# Patient Record
Sex: Female | Born: 1955 | Race: White | Hispanic: No | Marital: Married | Smoking: Never smoker
Health system: Southern US, Community
[De-identification: ages and names within clinical notes are randomized; demographics above are authoritative.]

## PROBLEM LIST (undated history)

## (undated) HISTORY — PX: OOPHORECTOMY: SHX86

## (undated) HISTORY — PX: ABDOMINAL HYSTERECTOMY: SHX81

---

## 2004-04-09 ENCOUNTER — Other Ambulatory Visit: Payer: Self-pay

## 2008-05-09 ENCOUNTER — Ambulatory Visit: Payer: Self-pay | Admitting: Family Medicine

## 2010-06-08 ENCOUNTER — Ambulatory Visit: Payer: Self-pay

## 2014-10-24 ENCOUNTER — Ambulatory Visit: Payer: Self-pay | Admitting: Family Medicine

## 2017-10-05 ENCOUNTER — Other Ambulatory Visit: Payer: Self-pay | Admitting: Family Medicine

## 2017-10-05 DIAGNOSIS — Z1231 Encounter for screening mammogram for malignant neoplasm of breast: Secondary | ICD-10-CM

## 2017-10-25 ENCOUNTER — Encounter: Payer: Self-pay | Admitting: Radiology

## 2017-10-25 ENCOUNTER — Ambulatory Visit
Admission: RE | Admit: 2017-10-25 | Discharge: 2017-10-25 | Disposition: A | Discharge status: Home / Self Care | Payer: Managed Care, Other (non HMO) | Source: Ambulatory Visit | Attending: Family Medicine | Admitting: Family Medicine

## 2017-10-25 DIAGNOSIS — Z1231 Encounter for screening mammogram for malignant neoplasm of breast: Secondary | ICD-10-CM

## 2019-08-06 ENCOUNTER — Other Ambulatory Visit: Payer: Self-pay

## 2019-08-06 ENCOUNTER — Emergency Department: Payer: Self-pay

## 2019-08-06 ENCOUNTER — Observation Stay
Admission: EM | Admit: 2019-08-06 | Discharge: 2019-08-07 | Disposition: A | Discharge status: Home / Self Care | Payer: Self-pay | Attending: Internal Medicine | Admitting: Internal Medicine

## 2019-08-06 ENCOUNTER — Observation Stay: Payer: Self-pay

## 2019-08-06 DIAGNOSIS — R079 Chest pain, unspecified: Secondary | ICD-10-CM

## 2019-08-06 DIAGNOSIS — R55 Syncope and collapse: Principal | ICD-10-CM | POA: Diagnosis present

## 2019-08-06 DIAGNOSIS — Z20828 Contact with and (suspected) exposure to other viral communicable diseases: Secondary | ICD-10-CM | POA: Insufficient documentation

## 2019-08-06 DIAGNOSIS — M79601 Pain in right arm: Secondary | ICD-10-CM | POA: Insufficient documentation

## 2019-08-06 DIAGNOSIS — R609 Edema, unspecified: Secondary | ICD-10-CM

## 2019-08-06 DIAGNOSIS — Z90710 Acquired absence of both cervix and uterus: Secondary | ICD-10-CM | POA: Insufficient documentation

## 2019-08-06 DIAGNOSIS — E669 Obesity, unspecified: Secondary | ICD-10-CM | POA: Insufficient documentation

## 2019-08-06 DIAGNOSIS — Z6837 Body mass index (BMI) 37.0-37.9, adult: Secondary | ICD-10-CM | POA: Insufficient documentation

## 2019-08-06 DIAGNOSIS — R0789 Other chest pain: Secondary | ICD-10-CM | POA: Insufficient documentation

## 2019-08-06 LAB — CBC
HCT: 38.1 % (ref 36.0–46.0)
Hemoglobin: 12.9 g/dL (ref 12.0–15.0)
MCH: 30.1 pg (ref 26.0–34.0)
MCHC: 33.9 g/dL (ref 30.0–36.0)
MCV: 88.8 fL (ref 80.0–100.0)
Platelets: 143 10*3/uL — ABNORMAL LOW (ref 150–400)
RBC: 4.29 MIL/uL (ref 3.87–5.11)
RDW: 12.3 % (ref 11.5–15.5)
WBC: 4.9 10*3/uL (ref 4.0–10.5)
nRBC: 0 % (ref 0.0–0.2)

## 2019-08-06 LAB — BASIC METABOLIC PANEL
Anion gap: 9 (ref 5–15)
BUN: 15 mg/dL (ref 8–23)
CO2: 23 mmol/L (ref 22–32)
Calcium: 8.7 mg/dL — ABNORMAL LOW (ref 8.9–10.3)
Chloride: 109 mmol/L (ref 98–111)
Creatinine, Ser: 0.58 mg/dL (ref 0.44–1.00)
GFR calc Af Amer: >60 mL/min (ref >60)
GFR calc non Af Amer: >60 mL/min (ref >60)
Glucose, Bld: 104 mg/dL — ABNORMAL HIGH (ref 70–99)
Potassium: 4.2 mmol/L (ref 3.5–5.1)
Sodium: 141 mmol/L (ref 135–145)

## 2019-08-06 LAB — CBC WITH DIFFERENTIAL/PLATELET
Abs Immature Granulocytes: 0.02 10*3/uL (ref 0.00–0.07)
Basophils Absolute: 0 10*3/uL (ref 0.0–0.1)
Basophils Relative: 0 %
Eosinophils Absolute: 0.2 10*3/uL (ref 0.0–0.5)
Eosinophils Relative: 4 %
HCT: 39.5 % (ref 36.0–46.0)
Hemoglobin: 12.9 g/dL (ref 12.0–15.0)
Immature Granulocytes: 0 %
Lymphocytes Relative: 32 %
Lymphs Abs: 1.5 10*3/uL (ref 0.7–4.0)
MCH: 30 pg (ref 26.0–34.0)
MCHC: 32.7 g/dL (ref 30.0–36.0)
MCV: 91.9 fL (ref 80.0–100.0)
Monocytes Absolute: 0.4 10*3/uL (ref 0.1–1.0)
Monocytes Relative: 8 %
Neutro Abs: 2.7 10*3/uL (ref 1.7–7.7)
Neutrophils Relative %: 56 %
Platelets: 145 10*3/uL — ABNORMAL LOW (ref 150–400)
RBC: 4.3 MIL/uL (ref 3.87–5.11)
RDW: 12.4 % (ref 11.5–15.5)
WBC: 4.8 10*3/uL (ref 4.0–10.5)
nRBC: 0 % (ref 0.0–0.2)

## 2019-08-06 LAB — TROPONIN I (HIGH SENSITIVITY)
Troponin I (High Sensitivity): 2 ng/L (ref <18)
Troponin I (High Sensitivity): 2 ng/L (ref <18)

## 2019-08-06 LAB — CREATININE, SERUM
Creatinine, Ser: 0.61 mg/dL (ref 0.44–1.00)
GFR calc Af Amer: >60 mL/min (ref >60)
GFR calc non Af Amer: >60 mL/min (ref >60)

## 2019-08-06 LAB — FIBRIN DERIVATIVES D-DIMER (ARMC ONLY): Fibrin derivatives D-dimer (ARMC): 301.43 ng/mL (FEU) (ref 0.00–499.00)

## 2019-08-06 MED ORDER — ALPRAZOLAM 1 MG PO TABS: 1.0000 mg | ORAL_TABLET | Freq: Every day | ORAL | Status: DC | PRN

## 2019-08-06 MED ORDER — SODIUM CHLORIDE 0.9 % IV SOLN
INTRAVENOUS | Status: AC
  Administered 2019-08-06 – 2019-08-07 (×2): via INTRAVENOUS

## 2019-08-06 MED ORDER — ONDANSETRON HCL 4 MG/2ML IJ SOLN: 4.0000 mg | Freq: Four times a day (QID) | INTRAMUSCULAR | Status: DC | PRN

## 2019-08-06 MED ORDER — IOHEXOL 350 MG/ML SOLN
75.0000 mL | Freq: Once | INTRAVENOUS | Status: AC | PRN
  Administered 2019-08-06: 75 mL via INTRAVENOUS

## 2019-08-06 MED ORDER — ACETAMINOPHEN 650 MG RE SUPP: 650.0000 mg | Freq: Four times a day (QID) | RECTAL | Status: DC | PRN

## 2019-08-06 MED ORDER — ONDANSETRON HCL 4 MG PO TABS: 4.0000 mg | ORAL_TABLET | Freq: Four times a day (QID) | ORAL | Status: DC | PRN

## 2019-08-06 MED ORDER — ACETAMINOPHEN 325 MG PO TABS
650.0000 mg | ORAL_TABLET | Freq: Four times a day (QID) | ORAL | Status: DC | PRN
  Administered 2019-08-07: 650 mg via ORAL
  Filled 2019-08-06: qty 2

## 2019-08-06 MED ORDER — VITAMIN C 500 MG PO TABS
500.0000 mg | ORAL_TABLET | Freq: Every day | ORAL | Status: DC
  Administered 2019-08-06: 500 mg via ORAL

## 2019-08-06 MED ORDER — ENOXAPARIN SODIUM 40 MG/0.4ML ~~LOC~~ SOLN
40.0000 mg | SUBCUTANEOUS | Status: DC
  Administered 2019-08-06: 40 mg via SUBCUTANEOUS
  Filled 2019-08-06: qty 0.4

## 2019-08-06 MED ORDER — DOCUSATE SODIUM 100 MG PO CAPS: 100.0000 mg | ORAL_CAPSULE | Freq: Two times a day (BID) | ORAL | Status: DC | PRN

## 2019-08-06 NOTE — ED Provider Notes (Signed)
[facility] Emergency Department Provider Note   ____________________________________________   First MD Initiated Contact with Patient 08/06/19 1048     (approximate)  I have reviewed the triage vital signs and the nursing notes.   HISTORY  Chief Complaint Loss of Consciousness and Chest Pain    HPI Hannah Cross is a 63 y.o. female with no significant past medical history who presents to the ED following syncopal episode.  Patient reports she remembers sitting at her desk at work when she suddenly had pain in her chest.  She remembers attempting to stand up and then woke up to having EMS around her.  She describes the chest pain as a pressure that seems to have resolved when she woke up.  She does not remember having any associated shortness of breath, denies any pain or swelling in her lower extremities.  She denies any history of DVT/PE, has not had any recent surgery or chemotherapy.  She states she passed out once many years ago, but states that episode was dissimilar from today.  She states she was feeling well when she went to work this morning with no recent fevers, chills, or cough.        History reviewed. No pertinent past medical history.  There are no active problems to display for this patient.   Past Surgical History:  Procedure Laterality Date   ABDOMINAL HYSTERECTOMY     OOPHORECTOMY      Prior to Admission medications   Medication Sig Start Date End Date Taking? Authorizing Provider  ALPRAZolam Prudy Feeler(XANAX) 1 MG tablet Take 1 mg by mouth daily as needed (headache).   Yes [provider]  Methylsulfonylmethane (MSM) 1000 MG TABS Take 1,000 mg by mouth daily.   Yes [provider]  vitamin C (ASCORBIC ACID) 500 MG tablet Take 500 mg by mouth daily.   Yes [provider]    Allergies Patient has no known allergies.  Family History  Problem Relation Age of Onset   Breast cancer Neg Hx     Social  History Social History   Tobacco Use   Smoking status: Never Smoker  Substance Use Topics   Alcohol use: Never    Frequency: Never   Drug use: Not on file    Review of Systems  Constitutional: No fever/chills Eyes: No visual changes. ENT: No sore throat. Cardiovascular: Positive for chest pain and syncope. Respiratory: Denies shortness of breath. Gastrointestinal: No abdominal pain.  No nausea, no vomiting.  No diarrhea.  No constipation. Genitourinary: Negative for dysuria. Musculoskeletal: Negative for back pain. Skin: Negative for rash. Neurological: Negative for headaches, focal weakness or numbness.  ____________________________________________   PHYSICAL EXAM:  VITAL SIGNS: ED Triage Vitals [08/06/19 1041]  Enc Vitals Group     BP (!) 141/77     Pulse Rate 68     Resp 12     Temp 98.2 F (36.8 C)     Temp Source Oral     SpO2 100 %     Weight 218 lb (98.9 kg)     Height 5\' 4"  (1.626 m)     Head Circumference      Peak Flow      Pain Score 0     Pain Loc      Pain Edu?      Excl. in GC?     Constitutional: Alert and oriented. Eyes: Conjunctivae are normal. Head: Atraumatic. Nose: No congestion/rhinnorhea. Mouth/Throat: Mucous membranes are moist. Neck:  Normal ROM Cardiovascular: Normal rate, regular rhythm. Grossly normal heart sounds. Respiratory: Normal respiratory effort.  No retractions. Lungs CTAB. Gastrointestinal: Soft and nontender. No distention. Genitourinary: deferred Musculoskeletal: No lower extremity tenderness nor edema. Neurologic:  Normal speech and language. No gross focal neurologic deficits are appreciated. Skin:  Skin is warm, dry and intact. No rash noted. Psychiatric: Mood and affect are normal. Speech and behavior are normal.  ____________________________________________   LABS (all labs ordered are listed, but only abnormal results are displayed)  Labs Reviewed  BASIC METABOLIC PANEL - Abnormal; Notable for the  following components:      Result Value   Glucose, Bld 104 (*)    Calcium 8.7 (*)    All other components within normal limits  CBC WITH DIFFERENTIAL/PLATELET - Abnormal; Notable for the following components:   Platelets 145 (*)    All other components within normal limits  SARS CORONAVIRUS 2  FIBRIN DERIVATIVES D-DIMER ([facility] ONLY)  TROPONIN I (HIGH SENSITIVITY)  TROPONIN I (HIGH SENSITIVITY)   ____________________________________________  EKG  ED ECG REPORT I, Blake Divine, the attending physician, personally viewed and interpreted this ECG.   Date: 08/06/2019  EKG Time: 10:37  Rate: 71  Rhythm: normal sinus rhythm  Axis: Normal  Intervals:none  ST&T Change: None    PROCEDURES  Procedure(s) performed (including Critical Care):  Procedures   ____________________________________________   INITIAL IMPRESSION / ASSESSMENT AND PLAN / ED COURSE       62 year old female with no significant past medical history presents following syncopal episode at work where she had pain in her chest and then suddenly passed out.  While she has no apparent risk factors for PE, chest pain and syncope is concerning, will check d-dimer.  Initial EKG without acute ischemic changes and troponin negative, remainder of labs unremarkable.  Chest x-ray negative for acute process.  D-dimer within normal limits, do not suspect PE given patient is low risk by [place].  Will admit for further syncope work-up given patient with chest pain associated with her syncope.  She remains asymptomatic at this time.      ____________________________________________   FINAL CLINICAL IMPRESSION(S) / ED DIAGNOSES  Final diagnoses:  Syncope and collapse  Chest pain, unspecified type     ED Discharge Orders    None       Note:  This document was prepared using Dragon voice recognition software and may include unintentional dictation errors.   Blake Divine, MD 08/06/19 1414

## 2019-08-06 NOTE — ED Notes (Signed)
Pt transported to CT ?

## 2019-08-06 NOTE — H&P (Addendum)
[facility] NAME: Hannah Cross    MR#:  [id number]  DATE OF BIRTH:  [birth year]  DATE OF ADMISSION:  08/06/2019  PRIMARY CARE PHYSICIAN: Ricardo Jericho, NP   REQUESTING/REFERRING PHYSICIAN: Blake Divine, MD  CHIEF COMPLAINT:   Loss of consciousness and chest pain HISTORY OF PRESENT ILLNESS:  Hannah Cross  is a 63 y.o. female with no past medical history came into the ED after she sustained a syncopal episode.  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  Patient is not quite sure how long she was sitting on the floor.  No similar episodes in the past and patient is pretty healthy denies any complaints.  Denies any recent travel no sick contacts.  Denies any fever or cough no other complaints troponins x2 are negative.  D-dimer sent in the normal range and EKG with no acute changes  PAST MEDICAL HISTORY:  History reviewed. No pertinent past medical history.  PAST SURGICAL HISTOIRY:   Past Surgical History:  Procedure Laterality Date  . ABDOMINAL HYSTERECTOMY    . OOPHORECTOMY      SOCIAL HISTORY:   Social History   Tobacco Use  . Smoking status: Never Smoker  Substance Use Topics  . Alcohol use: Never    Frequency: Never    FAMILY HISTORY:   Family History  Problem Relation Age of Onset  . Breast cancer Neg Hx     DRUG ALLERGIES:  No Known Allergies  REVIEW OF SYSTEMS:  CONSTITUTIONAL: No fever, fatigue or weakness.  EYES: No blurred or double vision.  EARS, NOSE, AND THROAT: No tinnitus or ear pain.  RESPIRATORY: No cough, shortness of breath, wheezing or hemoptysis.  CARDIOVASCULAR: No chest pain, orthopnea, edema.  GASTROINTESTINAL: No nausea, vomiting, diarrhea or abdominal pain.   GENITOURINARY: No dysuria, hematuria.  ENDOCRINE: No polyuria, nocturia,  HEMATOLOGY: No anemia, easy bruising or bleeding SKIN: No rash or lesion. MUSCULOSKELETAL: No joint pain or arthritis.   NEUROLOGIC: No tingling, numbness, weakness.  PSYCHIATRY: No anxiety or depression.   MEDICATIONS AT HOME:   Prior to Admission medications   Medication Sig Start Date End Date Taking? Authorizing Provider  ALPRAZolam Duanne Moron) 1 MG tablet Take 1 mg by mouth daily as needed (headache).   Yes [provider]  Methylsulfonylmethane (MSM) 1000 MG TABS Take 1,000 mg by mouth daily.   Yes [provider]  vitamin C (ASCORBIC ACID) 500 MG tablet Take 500 mg by mouth daily.   Yes [provider]      VITAL SIGNS:  Blood pressure (!) 159/94, pulse 70, temperature 98.2 F (36.8 C), temperature source Oral, resp. rate 12, height 5\' 4"  (1.626 m), weight 98.9 kg, SpO2 99 %.  PHYSICAL EXAMINATION:  GENERAL:  63 y.o.-year-old patient lying in the bed with no acute distress.  EYES: Pupils equal, round, reactive to light and accommodation. No scleral icterus. Extraocular muscles intact.  HEENT: Head atraumatic, normocephalic. Oropharynx and nasopharynx clear.  NECK:  Supple, no jugular venous distention. No thyroid enlargement, no tenderness.  LUNGS: Normal breath sounds bilaterally, no wheezing, rales,rhonchi or crepitation. No use of accessory muscles of respiration.  CARDIOVASCULAR: S1, S2 normal. No murmurs, rubs, or gallops.  ABDOMEN: Soft, nontender, nondistended. Bowel sounds present.  EXTREMITIES:  No pedal edema, cyanosis, or clubbing.  NEUROLOGIC: Cranial nerves II through XII are intact. Muscle strength 5/5 in all extremities. Sensation intact. Gait not checked.  PSYCHIATRIC: The patient is alert and oriented x 3.  SKIN: No obvious rash, lesion, or ulcer.   LABORATORY PANEL:   CBC Recent Labs  Lab 08/06/19 1043  WBC 4.8  HGB 12.9  HCT 39.5  PLT 145*    ------------------------------------------------------------------------------------------------------------------  Chemistries  Recent Labs  Lab 08/06/19 1043  NA 141  K 4.2  CL 109  CO2 23  GLUCOSE 104*  BUN 15  CREATININE 0.58  CALCIUM 8.7*   ------------------------------------------------------------------------------------------------------------------  Cardiac Enzymes No results for input(s): TROPONINI in the last 168 hours. ------------------------------------------------------------------------------------------------------------------  RADIOLOGY:  Dg Chest Portable 1 View  Result Date: 08/06/2019 CLINICAL DATA:  Syncopal episode today. EXAM: PORTABLE CHEST 1 VIEW COMPARISON:  None. FINDINGS: Lungs are clear. Heart size is normal. Aortic atherosclerosis noted. No pneumothorax or pleural fluid. No acute or focal bony abnormality. IMPRESSION: No acute disease. Electronically Signed   By: Drusilla Kannerhomas  Dalessio M.D.   On: 08/06/2019 11:10    EKG:   Orders placed or performed during the hospital encounter of 08/06/19  . EKG 12-Lead  . EKG 12-Lead    IMPRESSION AND PLAN:    #Syncope chest pain Admitted telemetry Cycle cardiac biomarkers.  Troponin -2 times so far Will get echocardiogram CTA head and neck Orthostatics and neuro checks Hydrate with IV fluids Myoview stress test ordered  #Chest pain atypical Patient is totally symptom-free during my examination We will provide nitroglycerin as needed D-dimers are negative Troponin negative to time so far We will get echocardiogram We will consider cardiology consult if chest pain is recurring Myoview stress test  #Anxiety Xanax as needed  #Obesity lifestyle modifications advised   DVT prophylaxis Lovenox subcu    All the records are reviewed and case discussed with ED provider. Management plans discussed with the patient, family and they are in agreement.  CODE STATUS: fc   TOTAL TIME TAKING CARE OF  THIS PATIENT: 42 minutes.   Note: This dictation was prepared with Dragon dictation along with smaller phrase technology. Any transcriptional errors that result from this process are unintentional.  Ramonita LabAruna Edmonia Gonser M.D on 08/06/2019 at 3:45 PM  Between 7am to 6pm - Pager - [phone]  After 6pm go to [email or web address] - password [email or web address] [facility]  [facility]  Office  [phone]  CC: Primary care physician; Titus MouldWhite, Elizabeth Burney, NP

## 2019-08-06 NOTE — ED Notes (Signed)
ED Provider at bedside. 

## 2019-08-06 NOTE — Progress Notes (Signed)
Family Meeting Note  Advance Directive:yes  Today a meeting took place with the Patient.    The following clinical team members were present during this meeting:MD  The following were discussed:Patient's diagnosis: Syncope, lightheadedness, chest pain, obesity, anxiety and treatment plan of care discussed in detail with the patient.  She verbalized understanding of the plan of care   Patient's progosis: Unable to determine and Goals for treatment: Full Code  Husband is the healthcare power of attorney  Additional follow-up to be provided: Hospitalist  Time spent during discussion:17 min  Nicholes Mango, MD

## 2019-08-06 NOTE — ED Notes (Signed)
.. ED TO INPATIENT HANDOFF REPORT  ED Nurse Name and Phone #: Allena Earingnnie [phone]  S Name/Age/Gender Hannah Cross 63 y.o. female Room/Bed: ED03A/ED03A  Code Status   Code Status: Not on file  Home/SNF/Other Home Patient oriented to: self, place, time and situation Is this baseline? Yes   Triage Complete: Triage complete  Chief Complaint syncope  Triage Note To eR via [facility] from work where she had witnessed syncopal episode. Pt nonarousable upon EMS arrival but became alert in ambulance c/o chest pain to palpation and with deep inspiration. administered 324 ASA and 2 nitro spray with EMS. CBG 80 with EMS. 20G saline lock started to left hand.   Pt alert and oriented X4, cooperative, RR even and unlabored, color WNL. Pt in NAD, currenlty   Allergies No Known Allergies  Level of Care/Admitting Diagnosis ED Disposition    ED Disposition Condition Comment   Admit  The patient appears reasonably stabilized for admission considering the current resources, flow, and capabilities available in the ED at this time, and I doubt any other [facility] requiring further screening and/or treatment in the ED prior to admission is  present.       B Medical/Surgery History History reviewed. No pertinent past medical history. Past Surgical History:  Procedure Laterality Date  . ABDOMINAL HYSTERECTOMY    . OOPHORECTOMY       A IV Location/Drains/Wounds Patient Lines/Drains/Airways Status   Active Line/Drains/Airways    None          Intake/Output Last 24 hours No intake or output data in the 24 hours ending 08/06/19 1523  Labs/Imaging Results for orders placed or performed during the hospital encounter of 08/06/19 (from the past 48 hour(s))  Basic metabolic panel     Status: Abnormal   Collection Time: 08/06/19 10:43 AM  Result Value Ref Range   Sodium 141 135 - 145 mmol/L   Potassium 4.2 3.5 - 5.1 mmol/L    Comment: HEMOLYSIS AT THIS LEVEL MAY AFFECT RESULT   Chloride 109 98 -  111 mmol/L   CO2 23 22 - 32 mmol/L   Glucose, Bld 104 (H) 70 - 99 mg/dL   BUN 15 8 - 23 mg/dL   Creatinine, Ser 1.610.58 0.44 - 1.00 mg/dL   Calcium 8.7 (L) 8.9 - 10.3 mg/dL   GFR calc non Af Amer >60 >60 mL/min   GFR calc Af Amer >60 >60 mL/min   Anion gap 9 5 - 15    Comment: Performed at [facility], [address] [place], [place] [address]  CBC with Differential     Status: Abnormal   Collection Time: 08/06/19 10:43 AM  Result Value Ref Range   WBC 4.8 4.0 - 10.5 K/uL   RBC 4.30 3.87 - 5.11 MIL/uL   Hemoglobin 12.9 12.0 - 15.0 g/dL   HCT 54.039.5 98.136.0 - 19.146.0 %   MCV 91.9 80.0 - 100.0 fL   MCH 30.0 26.0 - 34.0 pg   MCHC 32.7 30.0 - 36.0 g/dL   RDW 47.812.4 29.511.5 - 62.115.5 %   Platelets 145 (L) 150 - 400 K/uL   nRBC 0.0 0.0 - 0.2 %   Neutrophils Relative % 56 %   Neutro Abs 2.7 1.7 - 7.7 K/uL   Lymphocytes Relative 32 %   Lymphs Abs 1.5 0.7 - 4.0 K/uL   Monocytes Relative 8 %   Monocytes Absolute 0.4 0.1 - 1.0 K/uL   Eosinophils Relative 4 %   Eosinophils Absolute 0.2 0.0 -  0.5 K/uL   Basophils Relative 0 %   Basophils Absolute 0.0 0.0 - 0.1 K/uL   Immature Granulocytes 0 %   Abs Immature Granulocytes 0.02 0.00 - 0.07 K/uL    Comment: Performed at [facility], [address] [place], [place] [address]  Troponin I (High Sensitivity)     Status: None   Collection Time: 08/06/19 10:43 AM  Result Value Ref Range   Troponin I (High Sensitivity) 2 <18 ng/L    Comment: (NOTE) Elevated high sensitivity troponin I (hsTnI) values and significant  changes across serial measurements may suggest ACS but many other  chronic and acute conditions are known to elevate hsTnI results.  Refer to the "Links" section for chest pain algorithms and additional  guidance. Performed at [facility], [address] [place], [place] [address]   Troponin I (High Sensitivity)     Status: None   Collection Time: 08/06/19 12:47 PM  Result Value Ref Range   Troponin I (High  Sensitivity) 2 <18 ng/L    Comment: (NOTE) Elevated high sensitivity troponin I (hsTnI) values and significant  changes across serial measurements may suggest ACS but many other  chronic and acute conditions are known to elevate hsTnI results.  Refer to the "Links" section for chest pain algorithms and additional  guidance. Performed at [facility], [address] [place], [place] [address]   Fibrin derivatives D-Dimer     Status: None   Collection Time: 08/06/19 12:51 PM  Result Value Ref Range   Fibrin derivatives D-dimer ([facility]) 301.43 0.00 - 499.00 ng/mL (FEU)    Comment: (NOTE) <> Exclusion of Venous Thromboembolism (VTE) - OUTPATIENT ONLY   (Emergency Department or [place])   0-499 ng/ml (FEU): With a low to intermediate pretest probability                      for VTE this test result excludes the diagnosis                      of VTE.   >499 ng/ml (FEU) : VTE not excluded; additional work up for VTE is                      required. <> Testing on Inpatients and Evaluation of Disseminated Intravascular   Coagulation (DIC) Reference Range:   0-499 ng/ml (FEU) Performed at [facility], [address] [place], [place] [address]    Dg Chest Portable 1 View  Result Date: 08/06/2019 CLINICAL DATA:  Syncopal episode today. EXAM: PORTABLE CHEST 1 VIEW COMPARISON:  None. FINDINGS: Lungs are clear. Heart size is normal. Aortic atherosclerosis noted. No pneumothorax or pleural fluid. No acute or focal bony abnormality. IMPRESSION: No acute disease. Electronically Signed   By: Drusilla Kannerhomas  Dalessio M.D.   On: 08/06/2019 11:10    Pending Labs Unresulted Labs (From admission, onward)    Start     Ordered   08/06/19 1414  SARS CORONAVIRUS 2 Nasal Swab Aptima Multi Swab  (Asymptomatic/Tier 2 Patients Labs)  Once,   STAT    Question Answer Comment  Is this test for diagnosis or screening Screening   Symptomatic for COVID-19 as defined by CDC No   Hospitalized for  COVID-19 No   Admitted to ICU for COVID-19 No   Previously tested for COVID-19 No   Resident in a congregate (group) care setting No   Employed in healthcare setting No  Pregnant No      08/06/19 1413          Vitals/Pain Today's Vitals   08/06/19 1300 08/06/19 1330 08/06/19 1425 08/06/19 1426  BP: 131/76 (!) 142/77  (!) 159/94  Pulse: 65 66  70  Resp: 20 19  12   Temp:      TempSrc:      SpO2: 100% 97%  99%  Weight:      Height:      PainSc:   0-No pain     Isolation Precautions No active isolations  Medications Medications - No data to display  Mobility walks Moderate fall risk    If patient is a Neuro Trauma and patient is going to OR before floor call report to [place] nurse: [phone] or [phone]     R Recommendations: See Admitting Provider Note  Report given to:   Additional Notes:

## 2019-08-06 NOTE — ED Triage Notes (Addendum)
To eR via [facility] from work where she had witnessed syncopal episode. Pt nonarousable upon EMS arrival but became alert in ambulance c/o chest pain to palpation and with deep inspiration. administered 324 ASA and 2 nitro spray with EMS. CBG 80 with EMS. 20G saline lock started to left hand.   Pt alert and oriented X4, cooperative, RR even and unlabored, color WNL. Pt in NAD, currenlty

## 2019-08-07 ENCOUNTER — Observation Stay: Payer: Self-pay

## 2019-08-07 ENCOUNTER — Encounter: Payer: Self-pay | Admitting: Radiology

## 2019-08-07 ENCOUNTER — Observation Stay (HOSPITAL_BASED_OUTPATIENT_CLINIC_OR_DEPARTMENT_OTHER)
Admit: 2019-08-07 | Discharge: 2019-08-07 | Disposition: A | Discharge status: Home / Self Care | Payer: Self-pay | Attending: Internal Medicine | Admitting: Internal Medicine

## 2019-08-07 ENCOUNTER — Observation Stay (HOSPITAL_BASED_OUTPATIENT_CLINIC_OR_DEPARTMENT_OTHER): Payer: Self-pay

## 2019-08-07 ENCOUNTER — Other Ambulatory Visit: Payer: Self-pay | Admitting: Radiology

## 2019-08-07 DIAGNOSIS — R55 Syncope and collapse: Secondary | ICD-10-CM

## 2019-08-07 DIAGNOSIS — R079 Chest pain, unspecified: Secondary | ICD-10-CM

## 2019-08-07 LAB — CBC
HCT: 37 % (ref 36.0–46.0)
Hemoglobin: 12.3 g/dL (ref 12.0–15.0)
MCH: 29.6 pg (ref 26.0–34.0)
MCHC: 33.2 g/dL (ref 30.0–36.0)
MCV: 89.2 fL (ref 80.0–100.0)
Platelets: 130 10*3/uL — ABNORMAL LOW (ref 150–400)
RBC: 4.15 MIL/uL (ref 3.87–5.11)
RDW: 12.3 % (ref 11.5–15.5)
WBC: 4.2 10*3/uL (ref 4.0–10.5)
nRBC: 0 % (ref 0.0–0.2)

## 2019-08-07 LAB — LIPID PANEL
Cholesterol: 183 mg/dL (ref 0–200)
HDL: 52 mg/dL (ref >40)
LDL Cholesterol: 109 mg/dL — ABNORMAL HIGH (ref 0–99)
Total CHOL/HDL Ratio: 3.5 RATIO
Triglycerides: 111 mg/dL (ref <150)
VLDL: 22 mg/dL (ref 0–40)

## 2019-08-07 LAB — ECHOCARDIOGRAM COMPLETE
Height: 64 in
Weight: 3488 oz

## 2019-08-07 LAB — NM MYOCAR MULTI W/SPECT W/WALL MOTION / EF
Estimated workload: 1 METS
LV dias vol: 75 mL (ref 46–106)
LV sys vol: 24 mL
MPHR: 158 {beats}/min
Peak HR: 118 {beats}/min
Percent HR: 74 %
Rest HR: 75 {beats}/min
TID: 0.98

## 2019-08-07 LAB — COMPREHENSIVE METABOLIC PANEL
ALT: 22 U/L (ref 0–44)
AST: 21 U/L (ref 15–41)
Albumin: 3.5 g/dL (ref 3.5–5.0)
Alkaline Phosphatase: 96 U/L (ref 38–126)
Anion gap: 7 (ref 5–15)
BUN: 16 mg/dL (ref 8–23)
CO2: 25 mmol/L (ref 22–32)
Calcium: 8.6 mg/dL — ABNORMAL LOW (ref 8.9–10.3)
Chloride: 110 mmol/L (ref 98–111)
Creatinine, Ser: 0.59 mg/dL (ref 0.44–1.00)
GFR calc Af Amer: >60 mL/min (ref >60)
GFR calc non Af Amer: >60 mL/min (ref >60)
Glucose, Bld: 102 mg/dL — ABNORMAL HIGH (ref 70–99)
Potassium: 3.7 mmol/L (ref 3.5–5.1)
Sodium: 142 mmol/L (ref 135–145)
Total Bilirubin: 0.9 mg/dL (ref 0.3–1.2)
Total Protein: 6.3 g/dL — ABNORMAL LOW (ref 6.5–8.1)

## 2019-08-07 LAB — TSH: TSH: 2.216 u[IU]/mL (ref 0.350–4.500)

## 2019-08-07 LAB — SARS CORONAVIRUS 2 (TAT 6-24 HRS): SARS Coronavirus 2: NEGATIVE

## 2019-08-07 MED ORDER — TECHNETIUM TC 99M TETROFOSMIN IV KIT
32.7240 | PACK | Freq: Once | INTRAVENOUS | Status: AC | PRN
  Administered 2019-08-07: 32.724 via INTRAVENOUS

## 2019-08-07 MED ORDER — REGADENOSON 0.4 MG/5ML IV SOLN
0.4000 mg | Freq: Once | INTRAVENOUS | Status: AC
  Administered 2019-08-07: 10:00:00 0.4 mg via INTRAVENOUS

## 2019-08-07 MED ORDER — TECHNETIUM TC 99M TETROFOSMIN IV KIT
10.0000 | PACK | Freq: Once | INTRAVENOUS | Status: AC | PRN
  Administered 2019-08-07: 10.966 via INTRAVENOUS

## 2019-08-07 NOTE — Progress Notes (Signed)
*  PRELIMINARY RESULTS* Echocardiogram 2D Echocardiogram has been performed.  [place] 08/07/2019, 10:00 AM

## 2019-08-07 NOTE — Progress Notes (Signed)
[facility]   PATIENT NAME: Tawana ScaleCindy Gall    MR#:  [id number]  DATE OF BIRTH:  02/09/1956  SUBJECTIVE:  CHIEF COMPLAINT:   Chief Complaint  Patient presents with   Loss of Consciousness   Chest Pain   -Patient came in with chest pain and lightheadedness.  Has chronic vertigo -Denies any stress.  Underwent stress test this morning. -Complains of right arm pain proximal to the elbow.  REVIEW OF SYSTEMS:  Review of Systems  Constitutional: Negative for chills, fever and malaise/fatigue.  HENT: Negative for congestion, ear discharge, hearing loss and nosebleeds.   Eyes: Negative for blurred vision and double vision.  Respiratory: Negative for cough, shortness of breath and wheezing.   Cardiovascular: Positive for chest pain. Negative for palpitations and leg swelling.  Gastrointestinal: Negative for abdominal pain, constipation, diarrhea, nausea and vomiting.  Genitourinary: Negative for dysuria.  Musculoskeletal: Positive for myalgias. Negative for joint pain.  Neurological: Negative for dizziness, focal weakness, seizures, weakness and headaches.  Psychiatric/Behavioral: Negative for depression.    DRUG ALLERGIES:  No Known Allergies  VITALS:  Blood pressure 124/76, pulse 65, temperature 97.7 F (36.5 C), temperature source Oral, resp. rate 15, height 5\' 4"  (1.626 m), weight 98.9 kg, SpO2 99 %.  PHYSICAL EXAMINATION:  Physical Exam  GENERAL:  63 y.o.-year-old patient lying in the bed with no acute distress.  EYES: Pupils equal, round, reactive to light and accommodation. No scleral icterus. Extraocular muscles intact.  HEENT: Head atraumatic, normocephalic. Oropharynx and nasopharynx clear.  NECK:  Supple, no jugular venous distention. No thyroid enlargement, no tenderness.  LUNGS: Normal breath sounds bilaterally, no wheezing, rales,rhonchi or crepitation. No use of accessory muscles of respiration.  CARDIOVASCULAR: S1, S2  normal. No murmurs, rubs, or gallops.  ABDOMEN: Soft, nontender, nondistended. Bowel sounds present. No organomegaly or mass.  EXTREMITIES: No pedal edema, cyanosis, or clubbing.  Tenderness of the right arm right about the cubital fossa anteriorly noted.  Minimal swelling noted. NEUROLOGIC: Cranial nerves II through XII are intact. Muscle strength 5/5 in all extremities. Sensation intact. Gait not checked.  PSYCHIATRIC: The patient is alert and oriented x 3.  SKIN: No obvious rash, lesion, or ulcer.    LABORATORY PANEL:   CBC Recent Labs  Lab 08/07/19 0419  WBC 4.2  HGB 12.3  HCT 37.0  PLT 130*   ------------------------------------------------------------------------------------------------------------------  Chemistries  Recent Labs  Lab 08/07/19 0419  NA 142  K 3.7  CL 110  CO2 25  GLUCOSE 102*  BUN 16  CREATININE 0.59  CALCIUM 8.6*  AST 21  ALT 22  ALKPHOS 96  BILITOT 0.9   ------------------------------------------------------------------------------------------------------------------  Cardiac Enzymes No results for input(s): TROPONINI in the last 168 hours. ------------------------------------------------------------------------------------------------------------------  RADIOLOGY:  Ct Angio Head W Or Wo Contrast  Addendum Date: 08/06/2019   ADDENDUM REPORT: 08/06/2019 17:25 ADDENDUM: Noncontrast head CT images have now been provided. Cerebral volume is within normal limits for age. No midline shift, ventriculomegaly, mass effect, evidence of mass lesion, intracranial hemorrhage or evidence of cortically based acute infarction. Gray-white matter differentiation is within normal limits throughout the brain. Visualized paranasal sinuses and mastoids are clear. CONCLUSION: Normal noncontrast CT appearance of the brain. Electronically Signed   By: Odessa FlemingH  Hall M.D.   On: 08/06/2019 17:25   Result Date: 08/06/2019 CLINICAL DATA:  [age] year old female with witnessed  syncopal event. Chest pain and palpitations. EXAM: CT ANGIOGRAPHY HEAD AND NECK TECHNIQUE: Multidetector CT imaging of the head  and neck was performed using the standard protocol during bolus administration of intravenous contrast. Multiplanar CT image reconstructions and MIPs were obtained to evaluate the vascular anatomy. Carotid stenosis measurements (when applicable) are obtained utilizing NASCET criteria, using the distal internal carotid diameter as the denominator. CONTRAST:  35mL OMNIPAQUE IOHEXOL 350 MG/ML SOLN COMPARISON:  None. FINDINGS: CTA NECK Skeleton: No acute osseous abnormality identified. Mild for age spine degeneration. Upper chest: Mild atelectasis, otherwise negative. Other neck: No acute findings. Aortic arch: 3 vessel arch configuration, no arch atherosclerosis. Right carotid system: Negative aside from mild right ICA tortuosity. Left carotid system: Negative. Vertebral arteries: Negative proximal right subclavian artery and right vertebral artery origin. Codominant vertebral arteries, the right is normal to the skull base. Minimal plaque in the proximal left subclavian artery without stenosis. Normal left vertebral artery origin. Tortuous left V1 segment. The left vertebral artery is patent to the skull base without stenosis. CTA HEAD Posterior circulation: Codominant and normal V4 segments. Patent PICA origins and vertebrobasilar junction. Patent basilar artery without stenosis. Normal SCA and PCA origins. Posterior communicating arteries are diminutive or absent. Bilateral PCA branches are within normal limits. Anterior circulation: Both ICA siphons are patent without stenosis. Normal carotid termini, MCA and ACA origins. Anterior communicating artery and bilateral ACA branches are within normal limits. Median artery of the corpus callosum, normal variant. Left MCA M1, bifurcation, and left MCA branches are within normal limits. Right MCA M1, bifurcation, and right MCA branches are within  normal limits. Venous sinuses: Patent. Anatomic variants: Median artery of the corpus callosum. Other findings: No dedicated noncontrast CT of the brain was performed. Visible brain parenchyma appears negative on these CTA images. Review of the MIP images confirms the above findings IMPRESSION: 1. Negative for age CTA Head and Neck; minimal atherosclerosis and no stenosis. 2. No dedicated noncontrast Head CT was provided, grossly negative CT appearance of the brain on these CTA images. Electronically Signed: By: Odessa Fleming M.D. On: 08/06/2019 16:59   Ct Angio Neck W Or Wo Contrast  Addendum Date: 08/06/2019   ADDENDUM REPORT: 08/06/2019 17:25 ADDENDUM: Noncontrast head CT images have now been provided. Cerebral volume is within normal limits for age. No midline shift, ventriculomegaly, mass effect, evidence of mass lesion, intracranial hemorrhage or evidence of cortically based acute infarction. Gray-white matter differentiation is within normal limits throughout the brain. Visualized paranasal sinuses and mastoids are clear. CONCLUSION: Normal noncontrast CT appearance of the brain. Electronically Signed   By: Odessa Fleming M.D.   On: 08/06/2019 17:25   Result Date: 08/06/2019 CLINICAL DATA:  63 year old female with witnessed syncopal event. Chest pain and palpitations. EXAM: CT ANGIOGRAPHY HEAD AND NECK TECHNIQUE: Multidetector CT imaging of the head and neck was performed using the standard protocol during bolus administration of intravenous contrast. Multiplanar CT image reconstructions and MIPs were obtained to evaluate the vascular anatomy. Carotid stenosis measurements (when applicable) are obtained utilizing NASCET criteria, using the distal internal carotid diameter as the denominator. CONTRAST:  42mL OMNIPAQUE IOHEXOL 350 MG/ML SOLN COMPARISON:  None. FINDINGS: CTA NECK Skeleton: No acute osseous abnormality identified. Mild for age spine degeneration. Upper chest: Mild atelectasis, otherwise negative. Other  neck: No acute findings. Aortic arch: 3 vessel arch configuration, no arch atherosclerosis. Right carotid system: Negative aside from mild right ICA tortuosity. Left carotid system: Negative. Vertebral arteries: Negative proximal right subclavian artery and right vertebral artery origin. Codominant vertebral arteries, the right is normal to the skull base. Minimal plaque in the proximal  left subclavian artery without stenosis. Normal left vertebral artery origin. Tortuous left V1 segment. The left vertebral artery is patent to the skull base without stenosis. CTA HEAD Posterior circulation: Codominant and normal V4 segments. Patent PICA origins and vertebrobasilar junction. Patent basilar artery without stenosis. Normal SCA and PCA origins. Posterior communicating arteries are diminutive or absent. Bilateral PCA branches are within normal limits. Anterior circulation: Both ICA siphons are patent without stenosis. Normal carotid termini, MCA and ACA origins. Anterior communicating artery and bilateral ACA branches are within normal limits. Median artery of the corpus callosum, normal variant. Left MCA M1, bifurcation, and left MCA branches are within normal limits. Right MCA M1, bifurcation, and right MCA branches are within normal limits. Venous sinuses: Patent. Anatomic variants: Median artery of the corpus callosum. Other findings: No dedicated noncontrast CT of the brain was performed. Visible brain parenchyma appears negative on these CTA images. Review of the MIP images confirms the above findings IMPRESSION: 1. Negative for age CTA Head and Neck; minimal atherosclerosis and no stenosis. 2. No dedicated noncontrast Head CT was provided, grossly negative CT appearance of the brain on these CTA images. Electronically Signed: By: Genevie Ann M.D. On: 08/06/2019 16:59   Dg Chest Portable 1 View  Result Date: 08/06/2019 CLINICAL DATA:  Syncopal episode today. EXAM: PORTABLE CHEST 1 VIEW COMPARISON:  None. FINDINGS:  Lungs are clear. Heart size is normal. Aortic atherosclerosis noted. No pneumothorax or pleural fluid. No acute or focal bony abnormality. IMPRESSION: No acute disease. Electronically Signed   By: Inge Rise M.D.   On: 08/06/2019 11:10    EKG:   Orders placed or performed during the hospital encounter of 08/06/19   EKG 12-Lead   EKG 12-Lead    ASSESSMENT AND PLAN:   63 year old female with no significant past medical history presents to the hospital from work secondary to chest pain, syncopal episode.  1.  Chest pain and syncope-patient has had prior intermittent chest pains and had a negative stress test about 2 to 3 years ago. -Troponins have been negative.  CT angiogram without any acute findings -Blood pressure is within normal limits -Underwent Myoview today.  If negative, will follow-up with cardiology for possible cardiac monitoring as outpatient.  No arrhythmias while in the hospital. -Echocardiogram is normal -Patient denies any stress or anxiety currently.  2.  Right arm pain-likely muscle pain, distal biceps muscle tenderness and minimal swelling.  Check duplex to rule out any clot.  Apply ice pack and as needed Motrin - likely from IV insertion site  3. DVT prophylaxis- on lovenox   Independent at baseline   All the records are reviewed and case discussed with Care Management/Social Workerr. Management plans discussed with the patient, family and they are in agreement.  CODE STATUS: Full code  TOTAL TIME TAKING CARE OF THIS PATIENT: 38 minutes.   POSSIBLE D/C  today or tomorrow  , DEPENDING ON CLINICAL CONDITION.   Gladstone Lighter M.D on 08/07/2019 at 12:11 PM  Between 7am to 6pm - Pager - [phone]  After 6pm go to [email or web address] - password [email or web address] [facility]  Office  [phone]  CC: Primary care physician; Ricardo Jericho, NP

## 2019-08-07 NOTE — Progress Notes (Signed)
Pt d/c to home via husband. IVs removed intact. Education completed. All belongings sent with pt.

## 2019-08-08 LAB — HIV ANTIBODY (ROUTINE TESTING W REFLEX): HIV Screen 4th Generation wRfx: NONREACTIVE

## 2019-08-11 NOTE — Discharge Summary (Signed)
[facility] NAME: Hannah Cross    MR#:  [id number]  DATE OF BIRTH:  [birth year]  DATE OF ADMISSION:  08/06/2019   ADMITTING PHYSICIAN: Nicholes Mango, MD  DATE OF DISCHARGE: 08/07/2019  6:07 PM  PRIMARY CARE PHYSICIAN: Ricardo Jericho, NP   ADMISSION DIAGNOSIS:   Syncope and collapse [R55] Chest pain, unspecified type [R07.9]  DISCHARGE DIAGNOSIS:   Active Problems:   Syncope   SECONDARY DIAGNOSIS:   History reviewed. No pertinent past medical history.  HOSPITAL COURSE:   63 year old female with no significant past medical history presents to the hospital from work secondary to chest pain, syncopal episode.  1.  Chest pain and syncope- concern for vasovagal syncope - patient has had prior intermittent chest pains and had a negative stress test about 2 to 3 years ago. -Troponins have been negative this admission.  CT angiogram without any acute findings -Blood pressure is within normal limits -Underwent Myoview which showed low risk for ischemia.  No arrhythmias while in the hospital. -Echocardiogram is normal -Patient denies any stress or anxiety currently. - outpt cardiology f/u for a possible loop recorder  2.  Right arm pain-likely muscle pain, distal biceps muscle tenderness and minimal swelling.   - duplex negative for clot.  Apply ice pack and as needed Motrin - likely from IV insertion site    Independent at baseline, discharge today  DISCHARGE CONDITIONS:   Guarded  CONSULTS OBTAINED:    None  DRUG ALLERGIES:   No Known Allergies DISCHARGE MEDICATIONS:   Allergies as of 08/07/2019   No Known Allergies     Medication List    TAKE these medications   ALPRAZolam 1 MG tablet Commonly known as: XANAX Take 1 mg by mouth daily as needed (headache).   MSM 1000 MG Tabs Take 1,000 mg by mouth daily.   vitamin C 500 MG tablet Commonly known as: ASCORBIC ACID Take 500 mg by mouth  daily.        DISCHARGE INSTRUCTIONS:   1. PCP f/u in 1-2 weeks 2. Cardiology f/u in 1-2 weeks  DIET:   Cardiac diet  ACTIVITY:   Activity as tolerated  OXYGEN:   Home Oxygen: No.  Oxygen Delivery: room air  DISCHARGE LOCATION:   home   If you experience worsening of your admission symptoms, develop shortness of breath, life threatening emergency, suicidal or homicidal thoughts you must seek medical attention immediately by calling 911 or calling your MD immediately  if symptoms less severe.  You Must read complete instructions/literature along with all the possible adverse reactions/side effects for all the Medicines you take and that have been prescribed to you. Take any new Medicines after you have completely understood and accpet all the possible adverse reactions/side effects.   Please note  You were cared for by a hospitalist during your hospital stay. If you have any questions about your discharge medications or the care you received while you were in the hospital after you are discharged, you can call the unit and asked to speak with the hospitalist on call if the hospitalist that took care of you is not available. Once you are discharged, your primary care physician will handle any further medical issues. Please note that NO REFILLS for any discharge medications will be authorized once you are discharged, as it is imperative that you return to your primary care physician (or establish a relationship with a primary care physician if you do  not have one) for your aftercare needs so that they can reassess your need for medications and monitor your lab values.    On the day of Discharge:  VITAL SIGNS:   Blood pressure 120/68, pulse 66, temperature 98.7 F (37.1 C), temperature source Oral, resp. rate 16, height 5\' 4"  (1.626 m), weight 98.9 kg, SpO2 99 %.  PHYSICAL EXAMINATION:    GENERAL:  63 y.o.-year-old patient lying in the bed with no acute distress.  EYES:  Pupils equal, round, reactive to light and accommodation. No scleral icterus. Extraocular muscles intact.  HEENT: Head atraumatic, normocephalic. Oropharynx and nasopharynx clear.  NECK:  Supple, no jugular venous distention. No thyroid enlargement, no tenderness.  LUNGS: Normal breath sounds bilaterally, no wheezing, rales,rhonchi or crepitation. No use of accessory muscles of respiration.  CARDIOVASCULAR: S1, S2 normal. No murmurs, rubs, or gallops.  ABDOMEN: Soft, nontender, nondistended. Bowel sounds present. No organomegaly or mass.  EXTREMITIES: No pedal edema, cyanosis, or clubbing.  Tenderness of the right arm right about the cubital fossa anteriorly noted.  Minimal swelling noted. NEUROLOGIC: Cranial nerves II through XII are intact. Muscle strength 5/5 in all extremities. Sensation intact. Gait not checked.  PSYCHIATRIC: The patient is alert and oriented x 3.  SKIN: No obvious rash, lesion, or ulcer  DATA REVIEW:   CBC Recent Labs  Lab 08/07/19 0419  WBC 4.2  HGB 12.3  HCT 37.0  PLT 130*    Chemistries  Recent Labs  Lab 08/07/19 0419  NA 142  K 3.7  CL 110  CO2 25  GLUCOSE 102*  BUN 16  CREATININE 0.59  CALCIUM 8.6*  AST 21  ALT 22  ALKPHOS 96  BILITOT 0.9     Microbiology Results  Results for orders placed or performed during the hospital encounter of 08/06/19  SARS CORONAVIRUS 2 (TAT 6-12 HRS) Nasal Swab Aptima Multi Swab     Status: None   Collection Time: 08/06/19  2:18 PM   Specimen: Aptima Multi Swab; Nasal Swab  Result Value Ref Range Status   SARS Coronavirus 2 NEGATIVE NEGATIVE Final    Comment: (NOTE) SARS-CoV-2 target nucleic acids are NOT DETECTED. The SARS-CoV-2 RNA is generally detectable in upper and lower respiratory specimens during the acute phase of infection. Negative results do not preclude SARS-CoV-2 infection, do not rule out co-infections with other pathogens, and should not be used as the sole basis for treatment or other  patient management decisions. Negative results must be combined with clinical observations, patient history, and epidemiological information. The expected result is Negative. Fact Sheet for Patients: HairSlick.nohttps://www.fda.gov/media/138098/download Fact Sheet for Healthcare Providers: quierodirigir.comhttps://www.fda.gov/media/138095/download This test is not yet approved or cleared by the [place] FDA and  has been authorized for detection and/or diagnosis of SARS-CoV-2 by FDA under an Emergency Use Authorization (EUA). This EUA will remain  in effect (meaning this test can be used) for the duration of the COVID-19 declaration under Section 56 4(b)(1) of the Act, 21 U.S.C. section 360bbb-3(b)(1), unless the authorization is terminated or revoked sooner. Performed at [facility], [address] [place], [place] [address]     RADIOLOGY:  No results found.   Management plans discussed with the patient, family and they are in agreement.  CODE STATUS:  Code Status History    Date Active Date Inactive Code Status Order ID Comments User Context   08/06/2019 1648 08/07/2019 2112 Full Code [id number]  Ramonita LabGouru, Aruna, MD Inpatient   Advance Care Planning Activity  Advance Directive Documentation     Most Recent Value  Type of Advance Directive  Healthcare Power of Attorney  Pre-existing out of facility DNR order (yellow form or pink MOST form)  --  "MOST" Form in Place?  --      TOTAL TIME TAKING CARE OF THIS PATIENT: 38 minutes.    Enid Baas M.D on 08/11/2019 at 7:06 AM  Between 7am to 6pm - Pager - [phone]  After 6pm go to [email or web address] - Scientist, research (life sciences) [facility]  Office  [phone]  CC: Primary care physician; White, Arlyss Repress, NP   Note: This dictation was prepared with Dragon dictation along with smaller phrase technology. Any transcriptional errors that result from this process are unintentional.

## 2019-08-28 ENCOUNTER — Ambulatory Visit: Payer: Self-pay | Admitting: Cardiology

## 2021-04-30 ENCOUNTER — Emergency Department: Payer: BC Managed Care – PPO

## 2021-04-30 ENCOUNTER — Emergency Department
Admission: EM | Admit: 2021-04-30 | Discharge: 2021-04-30 | Disposition: A | Discharge status: Home / Self Care | Payer: BC Managed Care – PPO | Attending: Emergency Medicine | Admitting: Emergency Medicine

## 2021-04-30 DIAGNOSIS — R400 Somnolence: Secondary | ICD-10-CM | POA: Insufficient documentation

## 2021-04-30 DIAGNOSIS — M79602 Pain in left arm: Secondary | ICD-10-CM | POA: Insufficient documentation

## 2021-04-30 DIAGNOSIS — G43109 Migraine with aura, not intractable, without status migrainosus: Secondary | ICD-10-CM | POA: Insufficient documentation

## 2021-04-30 DIAGNOSIS — I639 Cerebral infarction, unspecified: Secondary | ICD-10-CM

## 2021-04-30 DIAGNOSIS — R531 Weakness: Secondary | ICD-10-CM | POA: Diagnosis present

## 2021-04-30 LAB — DIFFERENTIAL
Abs Immature Granulocytes: 0.01 10*3/uL (ref 0.00–0.07)
Basophils Absolute: 0 10*3/uL (ref 0.0–0.1)
Basophils Relative: 1 %
Eosinophils Absolute: 0.2 10*3/uL (ref 0.0–0.5)
Eosinophils Relative: 4 %
Immature Granulocytes: 0 %
Lymphocytes Relative: 31 %
Lymphs Abs: 1.3 10*3/uL (ref 0.7–4.0)
Monocytes Absolute: 0.4 10*3/uL (ref 0.1–1.0)
Monocytes Relative: 9 %
Neutro Abs: 2.4 10*3/uL (ref 1.7–7.7)
Neutrophils Relative %: 55 %

## 2021-04-30 LAB — CBC
HCT: 39.8 % (ref 36.0–46.0)
Hemoglobin: 13.8 g/dL (ref 12.0–15.0)
MCH: 31 pg (ref 26.0–34.0)
MCHC: 34.7 g/dL (ref 30.0–36.0)
MCV: 89.4 fL (ref 80.0–100.0)
Platelets: 143 10*3/uL — ABNORMAL LOW (ref 150–400)
RBC: 4.45 MIL/uL (ref 3.87–5.11)
RDW: 12.5 % (ref 11.5–15.5)
WBC: 4.3 10*3/uL (ref 4.0–10.5)
nRBC: 0 % (ref 0.0–0.2)

## 2021-04-30 LAB — COMPREHENSIVE METABOLIC PANEL
ALT: 21 U/L (ref 0–44)
AST: 24 U/L (ref 15–41)
Albumin: 4.2 g/dL (ref 3.5–5.0)
Alkaline Phosphatase: 116 U/L (ref 38–126)
Anion gap: 8 (ref 5–15)
BUN: 22 mg/dL (ref 8–23)
CO2: 25 mmol/L (ref 22–32)
Calcium: 9 mg/dL (ref 8.9–10.3)
Chloride: 109 mmol/L (ref 98–111)
Creatinine, Ser: 0.65 mg/dL (ref 0.44–1.00)
GFR, Estimated: >60 mL/min (ref >60)
Glucose, Bld: 108 mg/dL — ABNORMAL HIGH (ref 70–99)
Potassium: 3.9 mmol/L (ref 3.5–5.1)
Sodium: 142 mmol/L (ref 135–145)
Total Bilirubin: 1.1 mg/dL (ref 0.3–1.2)
Total Protein: 7.1 g/dL (ref 6.5–8.1)

## 2021-04-30 LAB — URINALYSIS, COMPLETE (UACMP) WITH MICROSCOPIC
Bilirubin Urine: NEGATIVE
Glucose, UA: NEGATIVE mg/dL
Ketones, ur: NEGATIVE mg/dL
Nitrite: POSITIVE — AB
Protein, ur: NEGATIVE mg/dL
Specific Gravity, Urine: 1.039 — ABNORMAL HIGH (ref 1.005–1.030)
WBC, UA: >50 WBC/hpf — ABNORMAL HIGH (ref 0–5)
pH: 7 (ref 5.0–8.0)

## 2021-04-30 LAB — URINE DRUG SCREEN, QUALITATIVE (ARMC ONLY)
Amphetamines, Ur Screen: NOT DETECTED
Barbiturates, Ur Screen: NOT DETECTED
Benzodiazepine, Ur Scrn: NOT DETECTED
Cannabinoid 50 Ng, Ur ~~LOC~~: NOT DETECTED
Cocaine Metabolite,Ur ~~LOC~~: NOT DETECTED
MDMA (Ecstasy)Ur Screen: NOT DETECTED
Methadone Scn, Ur: NOT DETECTED
Opiate, Ur Screen: NOT DETECTED
Phencyclidine (PCP) Ur S: NOT DETECTED
Tricyclic, Ur Screen: NOT DETECTED

## 2021-04-30 LAB — PROTIME-INR
INR: 1 (ref 0.8–1.2)
Prothrombin Time: 13.3 seconds (ref 11.4–15.2)

## 2021-04-30 LAB — CBG MONITORING, ED: Glucose-Capillary: 104 mg/dL — ABNORMAL HIGH (ref 70–99)

## 2021-04-30 LAB — APTT: aPTT: 27 seconds (ref 24–36)

## 2021-04-30 MED ORDER — SODIUM CHLORIDE 0.9 % IV SOLN
25.0000 mg | Freq: Once | INTRAVENOUS | Status: AC
  Administered 2021-04-30: 25 mg via INTRAVENOUS
  Filled 2021-04-30: qty 1

## 2021-04-30 MED ORDER — SODIUM CHLORIDE 0.9% FLUSH
3.0000 mL | Freq: Once | INTRAVENOUS | Status: AC
Start: 2021-04-30 — End: 2021-04-30
  Administered 2021-04-30: 3 mL via INTRAVENOUS

## 2021-04-30 MED ORDER — ASPIRIN 325 MG PO TABS
650.0000 mg | ORAL_TABLET | Freq: Once | ORAL | Status: AC
  Administered 2021-04-30: 650 mg via ORAL
  Filled 2021-04-30 (×2): qty 2

## 2021-04-30 MED ORDER — NALOXONE HCL 2 MG/2ML IJ SOSY
PREFILLED_SYRINGE | INTRAMUSCULAR | Status: AC
  Administered 2021-04-30: 1 mg via INTRAVENOUS
  Filled 2021-04-30: qty 2

## 2021-04-30 MED ORDER — NALOXONE HCL 2 MG/2ML IJ SOSY: 0.4000 mg | PREFILLED_SYRINGE | Freq: Once | INTRAMUSCULAR | Status: AC

## 2021-04-30 MED ORDER — IOHEXOL 350 MG/ML SOLN
75.0000 mL | Freq: Once | INTRAVENOUS | Status: AC | PRN
  Administered 2021-04-30: 75 mL via INTRAVENOUS

## 2021-04-30 MED ORDER — TOPIRAMATE 25 MG PO TABS: 25.0000 mg | ORAL_TABLET | Freq: Every day | ORAL | 2 refills | Status: AC

## 2021-04-30 NOTE — ED Notes (Signed)
0375 Code Stroke called to [place] at [facility] per [facility], pt going to rm 12

## 2021-04-30 NOTE — Progress Notes (Signed)
CODE STROKE- PHARMACY COMMUNICATION   Time CODE STROKE called/page received: 0818  Time response to CODE STROKE was made (in person or via phone): 0825  Time Stroke Kit retrieved from [facility]: 0830  Name of Provider/Nurse contacted: Dr. Lindzen, Neurology - no tPA    Abby K Ellington ,PharmD Clinical Pharmacist  04/30/2021  9:21 AM   

## 2021-04-30 NOTE — ED Notes (Signed)
Late Entry: neuro to CT, pt responsive to painful stimuli and some verbal. Constantly keeping eyes closed. C/o pain to arms and legs. 18 g placed to right arm. Narcan given 1 mg verbal bradler, pt denies drug use.

## 2021-04-30 NOTE — ED Notes (Signed)
Spoke wi th MRI for screening, speaking with pt husband now

## 2021-04-30 NOTE — ED Triage Notes (Signed)
Husband with pt reports pt was last normal at 0610 this am, came back home at 0730 and found pt on floor and weak.  Pt keeping eyes closed in triage, leaning to left, appears to have weakness in left arm and leg. Not following all commands or answering all questions appropritely Husband reports no health problems.  Repeatedly having to ask pt to open eyes

## 2021-04-30 NOTE — ED Provider Notes (Signed)
[facility] Emergency Department Provider Note   ____________________________________________   Event Date/Time   First MD Initiated Contact with Patient 04/30/21 [phone]     (approximate)  I have reviewed the triage vital signs and the nursing notes.   HISTORY  Chief Complaint Weakness    HPI Hannah Cross is a 65 y.o. female with the below stated past medical history and significant history of migraines who presents complaining of left arm pain, headache, and altered mental status per patient's husband who is at bedside.  Stroke alert was called due to the symptoms.  Husband states that patient's last known well was 0610.  At 0730 husband came home after calling patient on the phone and unable to understand her speech and found her on the floor unable to get up.  Patient unable to answer any further history or review of systems         History reviewed. No pertinent past medical history.  Patient Active Problem List   Diagnosis Date Noted  . Syncope 08/06/2019    Past Surgical History:  Procedure Laterality Date  . ABDOMINAL HYSTERECTOMY    . OOPHORECTOMY      Prior to Admission medications   Medication Sig Start Date End Date Taking? Authorizing Provider  topiramate (TOPAMAX) 25 MG tablet Take 1 tablet (25 mg total) by mouth at bedtime. 04/30/21 04/30/22 Yes Sara Keys, Clent Jacks, MD  ALPRAZolam Prudy Feeler) 1 MG tablet Take 1 mg by mouth daily as needed (headache).    [provider]  Methylsulfonylmethane (MSM) 1000 MG TABS Take 1,000 mg by mouth daily.    [provider]  vitamin C (ASCORBIC ACID) 500 MG tablet Take 500 mg by mouth daily.    [provider]    Allergies Patient has no known allergies.  Family History  Problem Relation Age of Onset  . Breast cancer Neg Hx     Social History Social History   Tobacco Use  . Smoking status: Never Smoker  . Smokeless tobacco: Never Used  Vaping Use  . Vaping  Use: Never used  Substance Use Topics  . Alcohol use: Never    Review of Systems Unable to assess ____________________________________________   PHYSICAL EXAM:  VITAL SIGNS: ED Triage Vitals  Enc Vitals Group     BP 04/30/21 0758 140/72     Pulse Rate 04/30/21 0758 66     Resp 04/30/21 0758 15     Temp 04/30/21 0758 97.9 F (36.6 C)     Temp Source 04/30/21 0758 Oral     SpO2 04/30/21 0758 96 %     Weight --      Height 04/30/21 0815 5\' 4"  (1.626 m)     Head Circumference --      Peak Flow --      Pain Score 04/30/21 1115 8     Pain Loc --      Pain Edu? --      Excl. in GC? --    Constitutional: Leaning to the left side in the stretcher with her eyes closed Eyes: Conjunctivae are normal. PERRL.  Cross eyed Head: Atraumatic. Nose: No congestion/rhinnorhea. Mouth/Throat: Mucous membranes are moist. Neck: No stridor Cardiovascular: Grossly normal heart sounds.  Good peripheral circulation. Respiratory: Normal respiratory effort.  No retractions. Gastrointestinal: Soft and nontender. No distention. Musculoskeletal: No obvious deformities Neurologic: Slow response tangential speech.  Left upper and lower extremity weakness, Skin:  Skin is warm and dry. No rash noted. Psychiatric:  Mood and affect are normal. Speech and behavior are normal.  ____________________________________________   LABS (all labs ordered are listed, but only abnormal results are displayed)  Labs Reviewed  CBC - Abnormal; Notable for the following components:      Result Value   Platelets 143 (*)    All other components within normal limits  COMPREHENSIVE METABOLIC PANEL - Abnormal; Notable for the following components:   Glucose, Bld 108 (*)    All other components within normal limits  URINALYSIS, COMPLETE (UACMP) WITH MICROSCOPIC - Abnormal; Notable for the following components:   Color, Urine YELLOW (*)    APPearance HAZY (*)    Specific Gravity, Urine 1.039 (*)    Hgb urine dipstick  SMALL (*)    Nitrite POSITIVE (*)    Leukocytes,Ua LARGE (*)    WBC, UA >50 (*)    Bacteria, UA MANY (*)    All other components within normal limits  CBG MONITORING, ED - Abnormal; Notable for the following components:   Glucose-Capillary 104 (*)    All other components within normal limits  DIFFERENTIAL  PROTIME-INR  APTT  URINE DRUG SCREEN, QUALITATIVE ([facility] ONLY)  I-STAT CREATININE, ED   ____________________________________________  EKG  ED ECG REPORT I, Merwyn Katos, the attending physician, personally viewed and interpreted this ECG.  Date: 04/30/2021 EKG Time: 0803 Rate: 65 Rhythm: normal sinus rhythm QRS Axis: normal Intervals: normal ST/T Wave abnormalities: normal Narrative Interpretation: no evidence of acute ischemia  ____________________________________________  RADIOLOGY  ED MD interpretation: CT without contrast of the head, CT angiography of the head and neck, and MRI of the brain showed no evidence of acute abnormalities  Official radiology report(s): MR BRAIN WO CONTRAST  Result Date: 04/30/2021 CLINICAL DATA:  Decreased responsiveness, headache, and neck pain. Evaluate for artery of percheron infarct. EXAM: MRI HEAD WITHOUT CONTRAST TECHNIQUE: Multiplanar, multiecho pulse sequences of the brain and surrounding structures were obtained without intravenous contrast. COMPARISON:  Head CT 04/30/2021 FINDINGS: Brain: There is no evidence of an acute infarct, intracranial hemorrhage, mass, midline shift, or extra-axial fluid collection. The ventricles and sulci are normal. No significant white matter disease is seen for age. The deep gray nuclei are normal in signal. Vascular: Major intracranial vascular flow voids are preserved. Skull and upper cervical spine: Unremarkable bone marrow signal. Sinuses/Orbits: Unremarkable orbits. Minimal mucosal thickening in the paranasal sinuses. Clear mastoid air cells. Other: None. IMPRESSION: Unremarkable appearance of the  brain for age. Electronically Signed   By: Sebastian Ache M.D.   On: 04/30/2021 11:07   CT HEAD CODE STROKE WO CONTRAST  Result Date: 04/30/2021 CLINICAL DATA:  Code stroke. 65 year old female status post fall this morning. Headache and left arm pain. Found down. EXAM: CT HEAD WITHOUT CONTRAST TECHNIQUE: Contiguous axial images were obtained from the base of the skull through the vertex without intravenous contrast. COMPARISON:  CT head and CTA head and neck 08/06/2019. FINDINGS: Brain: Cerebral volume is stable since 2020 and within normal limits for age. No midline shift, ventriculomegaly, mass effect, evidence of mass lesion, intracranial hemorrhage or evidence of cortically based acute infarction. Gray-white matter differentiation is within normal limits throughout the brain. Vascular: No suspicious intracranial vascular hyperdensity. Skull: Stable, negative. Sinuses/Orbits: Scattered small chronic paranasal sinus mucous retention cysts. Overall paranasal sinuses and mastoids are stable and well aerated. Other: Visualized orbits and scalp soft tissues are within normal limits. ASPECTS [facility] Stroke Program Early CT Score) Total score (0-10 with 10 being normal): 10 IMPRESSION: 1.  Stable since 2020 and normal for age non contrast CT appearance of the brain. ASPECTS 10. 2. These results were communicated to Dr. Otelia LimesLindzen at 8:38 am on 04/30/2021 by text page via the [facility]. Electronically Signed   By: Odessa FlemingH  Hall M.D.   On: 04/30/2021 08:38   CT ANGIO HEAD NECK W WO CM W PERF (CODE STROKE)  Result Date: 04/30/2021 CLINICAL DATA:  65 year old female code stroke presentation. Clinical presentation suggesting either basilar artery or large MCA territory ischemia. EXAM: CT ANGIOGRAPHY HEAD AND NECK CT PERFUSION BRAIN TECHNIQUE: Multidetector CT imaging of the head and neck was performed using the standard protocol during bolus administration of intravenous contrast. Multiplanar CT image  reconstructions and MIPs were obtained to evaluate the vascular anatomy. Carotid stenosis measurements (when applicable) are obtained utilizing NASCET criteria, using the distal internal carotid diameter as the denominator. Multiphase CT imaging of the brain was performed following IV bolus contrast injection. Subsequent parametric perfusion maps were calculated using RAPID software. CONTRAST:  75mL OMNIPAQUE IOHEXOL 350 MG/ML SOLN COMPARISON:  Plain head CT 0821 hours today. Prior CTA head and neck 08/06/2019. FINDINGS: CT Brain Perfusion Findings: ASPECTS: 10 CBF (<30%) Volume: None Perfusion (Tmax>6.0s) volume: None Mismatch Volume: Not applicable Infarction Location:Not applicable CTA NECK Skeleton: No acute osseous abnormality identified. Mild for age cervical spine degeneration. Upper chest: Stable, negative. Other neck: Chronic sebaceous cyst anterior left neck between the mandible and thyroid. Otherwise negative. Aortic arch: 3 vessel arch configuration with no arch atherosclerosis. Right carotid system: Stable mild tortuosity and otherwise negative. Left carotid system: Stable mild tortuosity and otherwise negative. Vertebral arteries: Proximal right subclavian artery and right vertebral artery origin are patent with no significant plaque, no stenosis. Right vertebral artery remains patent and normal to the skull base. Minimal plaque in the proximal left subclavian artery without stenosis. Normal left vertebral artery origin. Tortuous left V1 segment. Left vertebral artery is patent to the skull base without stenosis. CTA HEAD Posterior circulation: Codominant distal vertebral arteries with no plaque or stenosis. Normal PICA origins and vertebrobasilar junction. Basilar artery is widely patent without irregularity or stenosis. Normal SCA and PCA origins. Posterior communicating arteries are diminutive or absent. Bilateral PCA branches are stable and within normal limits. Anterior circulation: Both ICA  siphons are patent without plaque or stenosis. Normal MCA and ACA origins. Anterior communicating artery with median artery of the corpus callosum, normal variant. Bilateral ACA branches are stable and within normal limits. Left MCA M1, left MCA bifurcation and left MCA branches are stable and within normal limits. Right MCA M1, bifurcation, and right MCA branches are stable and within normal limits. Venous sinuses: Patent. Anatomic variants: Median artery of the corpus callosum. Review of the MIP images confirms the above findings IMPRESSION: 1. Negative for large vessel occlusion and no infarct core or ischemic penumbra detected by CT Perfusion. 2. Stable since 2020 and negative for age CTA head and neck. Minimal atherosclerosis. No stenosis. Preliminary report of this exam was discussed by telephone with Dr. Otelia LimesLindzen on 04/30/2021 at 0911 hours. Electronically Signed   By: Odessa FlemingH  Hall M.D.   On: 04/30/2021 09:20    ____________________________________________   PROCEDURES  Procedure(s) performed (including Critical Care):  .1-3 Lead EKG Interpretation Performed by: Merwyn KatosBradler, Dewaun Kinzler K, MD Authorized by: Merwyn KatosBradler, Shea Kapur K, MD     Interpretation: normal     ECG rate:  62   ECG rate assessment: normal     Rhythm: sinus rhythm  Ectopy: none     Conduction: normal       ____________________________________________   INITIAL IMPRESSION / ASSESSMENT AND PLAN / ED COURSE  As part of my medical decision making, I reviewed the following data within the electronic MEDICAL RECORD NUMBER Nursing notes reviewed and incorporated, Labs reviewed, EKG interpreted, Old chart reviewed, Radiograph reviewed and Notes from prior ED visits reviewed and incorporated        65 year old female with a history of complex migraines presents with Headache.  Patient does have focal neurologic symptoms and therefore will require a further stroke work-up. Pt is nontoxic. VSS.  Based on history and normal neurological exam  I have low suspicion for intracranial tumor, intracranial bleed, meningitis, temporal arteritis, glaucoma, CO poisoning.  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 Consults: Dr. Otelia Limes and neurology recommends treatment of patient's headache as well as rapid improvement in patient's symptoms is likely due to a basilar migraine and recommends further treating headache and follow-up with neurology Disposition: Discharge home with strict return precautions and instructions for prompt primary care follow up.      ____________________________________________   FINAL CLINICAL IMPRESSION(S) / ED DIAGNOSES  Final diagnoses:  Basilar migraine  Somnolence     ED Discharge Orders         Ordered    topiramate (TOPAMAX) 25 MG tablet  Daily at bedtime        04/30/21 1241           Note:  This document was prepared using Dragon voice recognition software and may include unintentional dictation errors.   Merwyn Katos, MD 04/30/21 1556

## 2021-04-30 NOTE — ED Notes (Signed)
Called Emergency line # [phone] to initiate Code stroke spoke to [place] who will activate page

## 2021-04-30 NOTE — ED Notes (Signed)
CBG 104  Pt to CT

## 2021-04-30 NOTE — Consult Note (Addendum)
Referring Physician: Dr. Vicente Males    Chief Complaint: AMS and headached  HPI: Hannah Cross is an 65 y.o. female presenting as a Code Stroke after she was found at home with decreased responsiveness by her husband. LKN was 0610 when husband left for work. The patient was either already preparing to go to work or was about to get prepared. Husband got a call from her shortly afterwards and her speech was unintelligible, so he went back home at 0730 where he found her on the floor appearing to be weak and complaining of headache and left arm pain. EMS was called and she was brought to the ED where she was complaining of left arm pain and headache on arrival. She was noted to be altered but was able to state that she tripped and fell this morning while trying to get to her phone. Per 1st nursing note, on arrival at 7:53 AM he was keeping her eyes closed but would open them to command, was awake, alert and oriented x 3. At 8:16 AM ; she was noted to be leaning to the left and also appeared to have weakness in her left arm and leg; she also was not following all commands or answering all questions appropriately. Code Stroke was then called given her acute deterioration. CBG was 104. Afebrile, satting normally, nontachycardic and normotensive on initial vitals check.   LSN: [id number] tPA Given: No: Presenting symptoms and signs most consistent with basilar migraine. Rapidly improving. Risks of tPA outweigh potential benefits due to low likelihood of stroke.   PMHx: No health problems per husband  Past Surgical History:  Procedure Laterality Date  . ABDOMINAL HYSTERECTOMY    . OOPHORECTOMY      Family History  Problem Relation Age of Onset  . Breast cancer Neg Hx    Social History:  reports that she has never smoked. She has never used smokeless tobacco. She reports that she does not drink alcohol. No history on file for drug use.  Allergies: No Known Allergies  Medications:  Prior to Admission:    Scheduled: . naloxone      . sodium chloride flush  3 mL Intravenous Once    ROS: Unable to obtain due to AMS.   Physical Examination: Blood pressure 140/72, pulse 66, temperature 97.9 F (36.6 C), temperature source Oral, resp. rate 15, height 5\' 4"  (1.626 m), SpO2 96 %.  HEENT: /AT. No nuchal rigidity but with neck pain elicited by passive flexion and rotation.  Lungs: Respirations unlabored Ext: No edema  Neurologic Examination: Mental Status: Eyes are closed, which patient will not open to commands. Somnolent with blunted affect and slow speech. Not oriented to situation, but knows she is in a health care facility. Is not able to consistently answer other orientation questions. Speech is hypophonic and slow but is fluent and non-dysarthric. States "ten" when five fingers held in front of her while examiner lifts her eyelids. Does not appear able to fixate with eyes showing spontaneous roving movements near the midline. Able to state "one" when examiner's finger is held in front of her. States "a bad picture" when a badge is held in front of her. Follows only some simple commands, which requires frequent repetition.  Cranial Nerves: II:  Pupils 2 mm and reactive bilaterally. No blink to threat in central vision or to left or right.  III,IV, VI: Esotropia with roving EOM near the midline. Cannot track or fixate, but can perceive a picture on a badge held  in front of her. No nystagmus. Oculocephalic reflex intact.  V,VII: Grimace to command is symmetric. Reacts to touch bilaterally and states "yes" when a cold stimulus is applied to her face bilaterally. VIII: Hearing intact to voice.  IX,X: Hypophonic speech XI: Head is midline XII: Midline tongue extension  Motor: BUE remain elevated for a short period when raised and passively released. Uses BUE equally with normal strength when asked to move from stretcher to CT table using her own power.  Withdraws BLE slowly but purposefully  and equally to noxious plantar stimulation.  Able to coordinate arms and legs when moving from stretcher to CT table; no asymmetry noted.  Sensory: Intact to touch and cool temperature x 4.  Deep Tendon Reflexes:  1+ bilateral brachioradialis and patellae.  Toes downgoing bilaterally  Cerebellar: Unable to formally assess due to poor comprehension of complex commands. No gross ataxia noted while patient was moving from stretcher to CT table.  Gait: Unable to assess.   Results for orders placed or performed during the hospital encounter of 04/30/21 (from the past 48 hour(s))  CBG monitoring, ED     Status: Abnormal   Collection Time: 04/30/21  8:17 AM  Result Value Ref Range   Glucose-Capillary 104 (H) 70 - 99 mg/dL    Comment: Glucose reference range applies only to samples taken after fasting for at least 8 hours.   Comment 1 Notify RN    Comment 2 Document in Chart    CT HEAD CODE STROKE WO CONTRAST  Result Date: 04/30/2021 CLINICAL DATA:  Code stroke. 65 year old female status post fall this morning. Headache and left arm pain. Found down. EXAM: CT HEAD WITHOUT CONTRAST TECHNIQUE: Contiguous axial images were obtained from the base of the skull through the vertex without intravenous contrast. COMPARISON:  CT head and CTA head and neck 08/06/2019. FINDINGS: Brain: Cerebral volume is stable since 2020 and within normal limits for age. No midline shift, ventriculomegaly, mass effect, evidence of mass lesion, intracranial hemorrhage or evidence of cortically based acute infarction. Gray-white matter differentiation is within normal limits throughout the brain. Vascular: No suspicious intracranial vascular hyperdensity. Skull: Stable, negative. Sinuses/Orbits: Scattered small chronic paranasal sinus mucous retention cysts. Overall paranasal sinuses and mastoids are stable and well aerated. Other: Visualized orbits and scalp soft tissues are within normal limits. ASPECTS [facility] Stroke Program  Early CT Score) Total score (0-10 with 10 being normal): 10 IMPRESSION: 1. Stable since 2020 and normal for age non contrast CT appearance of the brain. ASPECTS 10. 2. These results were communicated to Dr. Otelia Limes at 8:38 am on 04/30/2021 by text page via the [facility] messaging system. Electronically Signed   By: Odessa Fleming M.D.   On: 04/30/2021 08:38    Assessment: 65 y.o. female presenting as a Code Stroke after being found at home with decreased responsiveness, headache and neck pain 1. Exam reveals a somnolent, disoriented and confused patient without aphasia or focal motor or sensory deficit.   2. CT head is normal.  3. CTA of head and neck negative for LVO 4. CTP normal.  5. Overall presentation syndromically appears most consistent with a hypoactive delirium. She was assessed with vascular imaging about 2 years ago for a similar episode which resolved on its own after about a day, per husband. She has a history of severe migraines, occurring about 5 times per year. In the context of her history, her presentation appears most consistent with an acute complicated migraine, specifically a basilar migraine. An  artery of Percheron infarction is also possible, however, and STAT MRI brain is needed to further assess.  6. Currently complaining of 8/10 headache with neck pain, consistent with a migraine. No nuchal rigidity and no fever present. Also with normal white count. Meningitis is felt to be unlikely. No jerking/twitching or other adventitious movements to suggest seizure.  7. Stroke Risk Factors - None.   Recommendations: 1. STAT MRI brain (ordered) 2. Frequent neuro checks 3. 650 mg crushed ASA po x 1 now (ordered)  4. Telemetry monitoring 5. Phenergan 25 mg IV x 1 (ordered)  Addendum: MRI brain is normal.   @Electronically  signed: Dr. 04/30/2021, 8:45 AM

## 2021-04-30 NOTE — ED Notes (Signed)
IV catheter removed intact without complication.  D/C instructions given to husband.  Pt advised not to drive,  RX and follow up discussed. All questions addressed.  Understanding verbalized.  Pt left ER via w/c with husband.

## 2021-04-30 NOTE — ED Notes (Signed)
Pt passes swallow screen.

## 2021-04-30 NOTE — ED Triage Notes (Signed)
First Nurse Note:  ARrives with C/O left arm p ain, headache.  STates fell this morning while trying to get to her phone.  States she tripped and fell.  Husband states patient was on the floor of the home when he came home.  Patient is AAOx3.  Skin warm and dry.  NAD

## 2022-08-14 IMAGING — MR MR HEAD W/O CM
12 series · 48 of 48 positions shown · non-contrast
Comparison: Head CT 04/30/2021

CLINICAL DATA: Decreased responsiveness, headache, and neck pain.
Evaluate for artery of Ferien[place] infarct.

EXAM:
MRI HEAD WITHOUT CONTRAST
TECHNIQUE: Multiplanar, multiecho pulse sequences of the brain and surrounding
structures were obtained without intravenous contrast.

[Series 9: ax dwi_tracew · axial · 3.0mm · 0.65mm/px · z∈[-97,+54]mm · 4 of 48 slices shown]
[im 1/48]
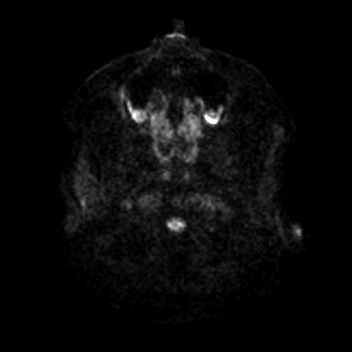
[im 16/48]
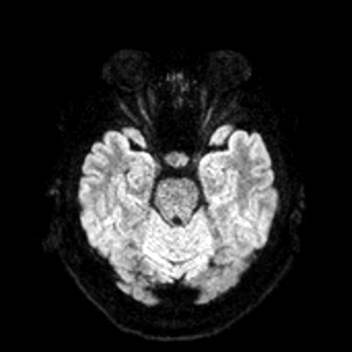
[im 32/48]
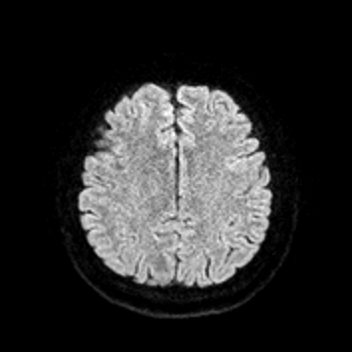
[im 48/48]
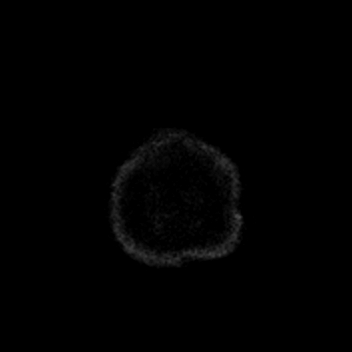

[Series 10: ax dwi_adc · axial · 3.0mm · 0.65mm/px · z∈[-97,+54]mm · 3 of 48 slices shown]
[im 1/48]
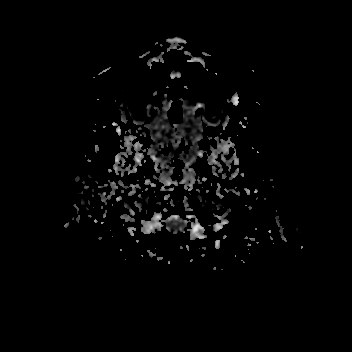
[im 24/48]
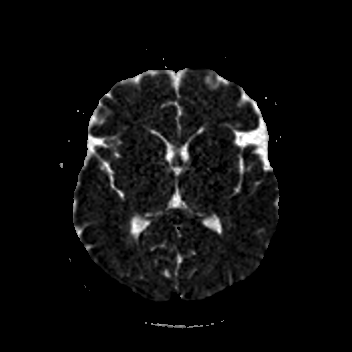
[im 48/48]
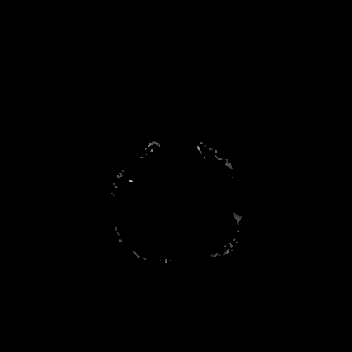

[Series 11: cor dwi_tracew · coronal · 5.0mm · 0.68mm/px · 3 of 40 slices shown]
[im 1/40]
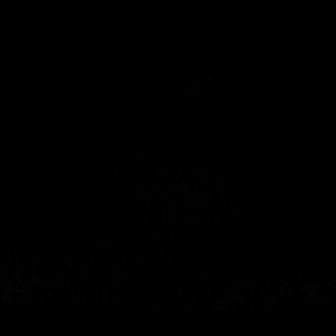
[im 20/40]
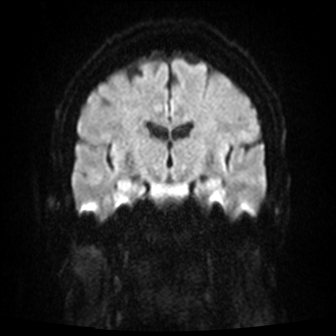
[im 40/40]
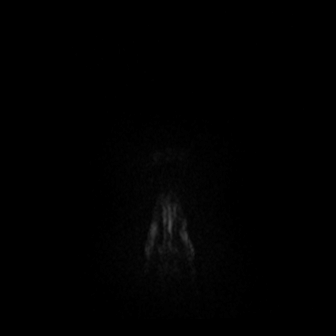

[Series 12: cor dwi_adc · coronal · 5.0mm · 0.68mm/px · 3 of 39 slices shown]
[im 1/39]
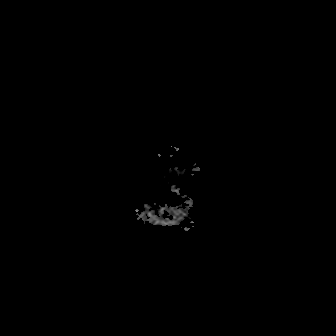
[im 20/39]
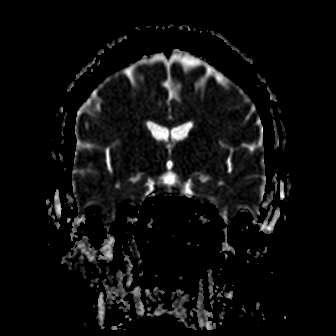
[im 39/39]
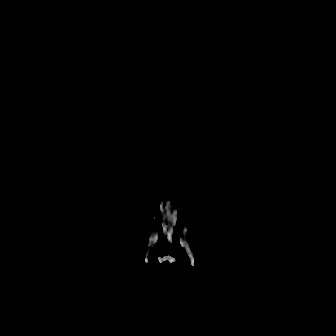

[Series 13: T1 · sagittal · 5.0mm · 0.62mm/px · 2 of 23 slices shown (1 of 2)]
[im 1/23]
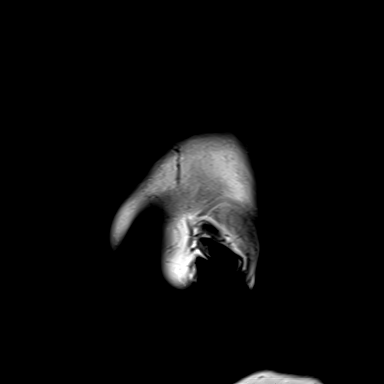
[im 23/23]
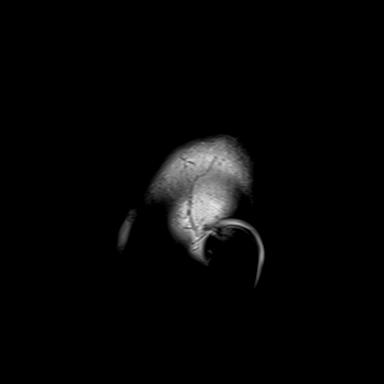

[Series 14: T2 · axial · 5.0mm · 0.53mm/px · z∈[-99,+53]mm · 2 of 27 slices shown (1 of 2)]
[im 1/27]
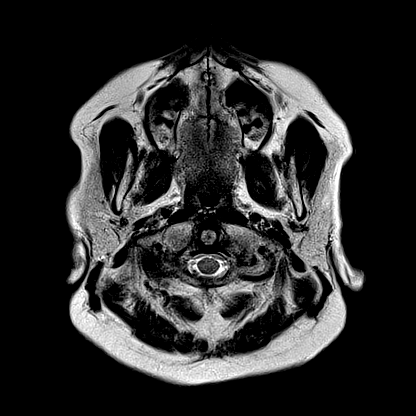
[im 27/27]
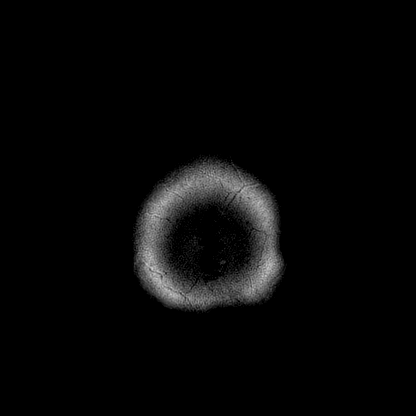

[Series 15: mag_images · axial · 3.0mm · 0.90mm/px · z∈[-107,+65]mm · 4 of 60 slices shown]
[im 1/60]
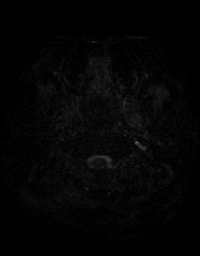
[im 20/60]
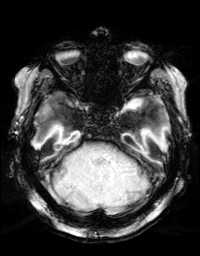
[im 40/60]
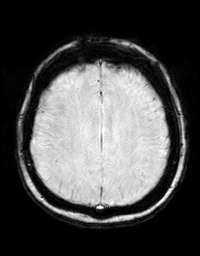
[im 60/60]
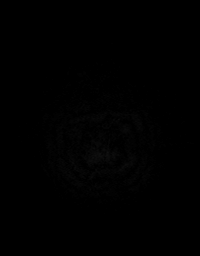

[Series 16: pha_images · axial · 3.0mm · 0.90mm/px · z∈[-107,+65]mm · 4 of 60 slices shown]
[im 1/60]
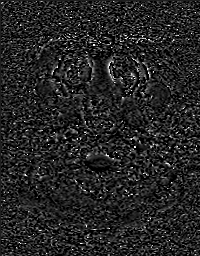
[im 20/60]
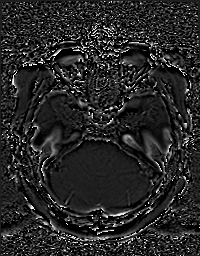
[im 40/60]
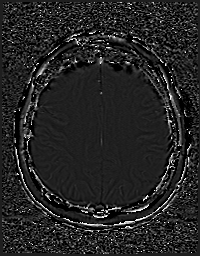
[im 60/60]
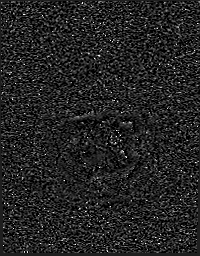

[Series 17: swi_images · axial · 3.0mm · 0.90mm/px · z∈[-107,+65]mm · 4 of 60 slices shown]
[im 1/60]
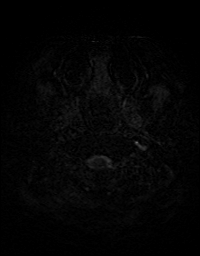
[im 20/60]
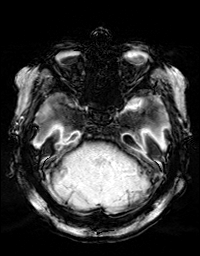
[im 40/60]
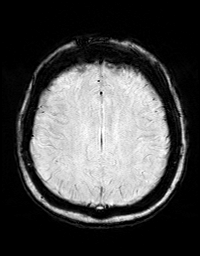
[im 60/60]
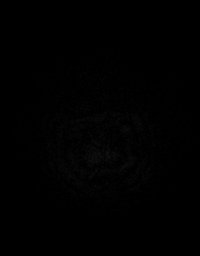

[Series 19: FLAIR · axial · 3.0mm · 0.53mm/px · z∈[-101,+57]mm · 4 of 55 slices shown]
[im 1/55]
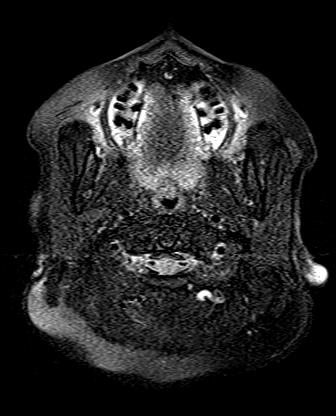
[im 19/55]
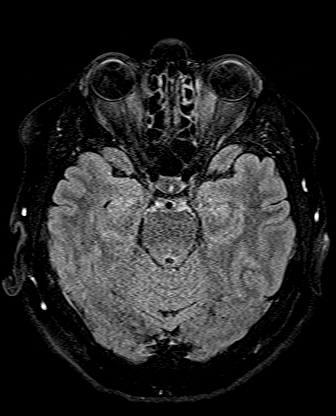
[im 37/55]
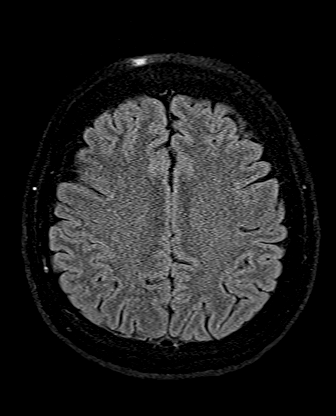
[im 55/55]
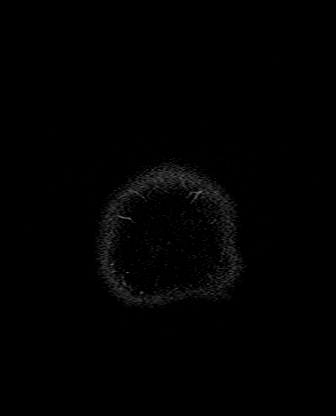

[Series 20: T1 · axial · 1.0mm · 0.98mm/px · z∈[-101,+69]mm · 13 of 176 slices shown (2 of 2)]
[im 1/176]
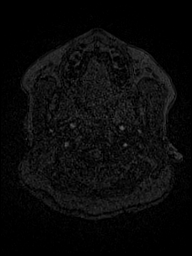
[im 15/176]
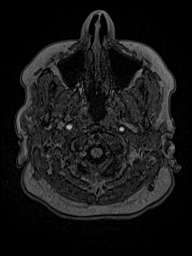
[im 30/176]
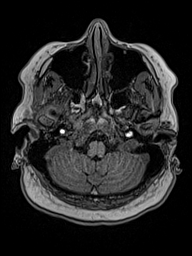
[im 44/176]
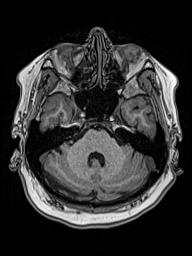
[im 59/176]
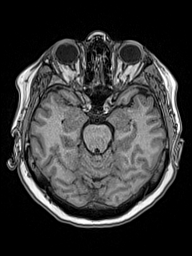
[im 73/176]
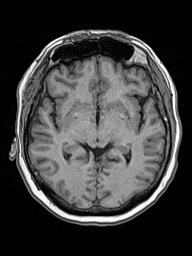
[im 88/176]
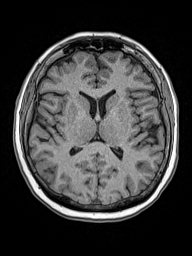
[im 103/176]
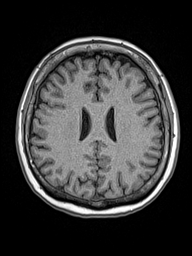
[im 117/176]
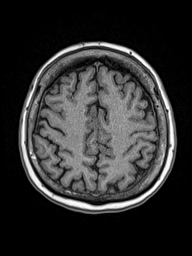
[im 132/176]
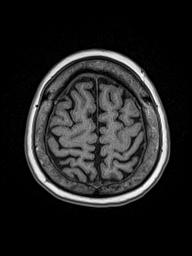
[im 146/176]
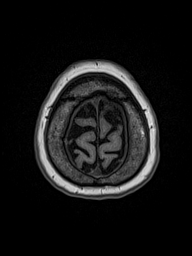
[im 161/176]
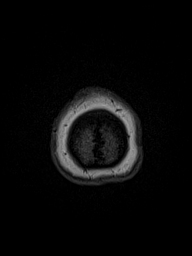
[im 176/176]
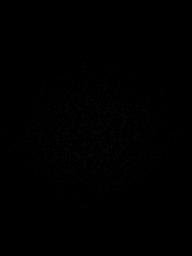

[Series 21: T2 · coronal · 5.0mm · 0.45mm/px · 2 of 29 slices shown (2 of 2)]
[im 1/29]
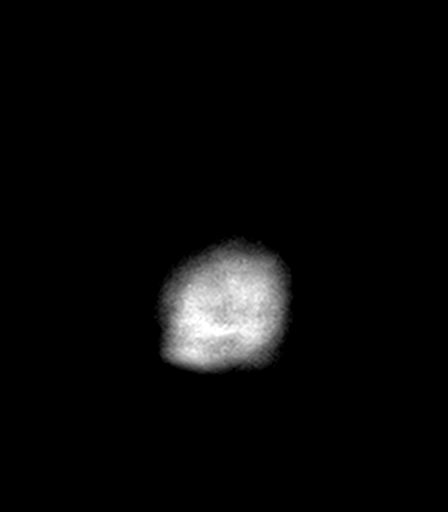
[im 29/29]
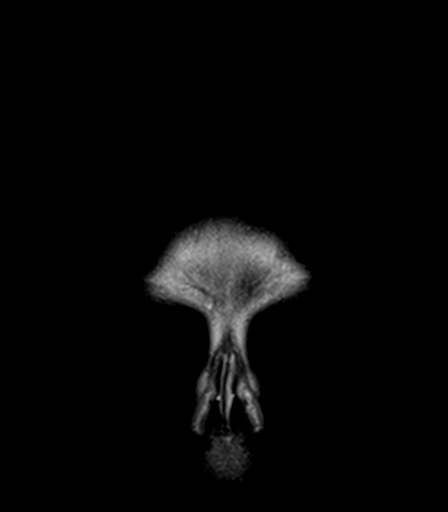

[48 of 48 positions shown; findings below may reference images not displayed]

FINDINGS: Brain: There is no evidence of an acute infarct, intracranial
hemorrhage, mass, midline shift, or extra-axial fluid collection.
The ventricles and sulci are normal. No significant white matter
disease is seen for age. The deep gray nuclei are normal in signal.

Vascular: Major intracranial vascular flow voids are preserved.

Skull and upper cervical spine: Unremarkable bone marrow signal.

Sinuses/Orbits: Unremarkable orbits. Minimal mucosal thickening in
the paranasal sinuses. Clear mastoid air cells.

Other: None.
IMPRESSION: Unremarkable appearance of the brain for age.

## 2022-08-14 IMAGING — CT CT HEAD CODE STROKE
3 of 4 series · 15 of 47 positions shown, 18 images · non-contrast
Comparison: CT head and CTA head and neck 08/06/2019.

CLINICAL DATA: Code stroke. 65-year-old female status post fall
this morning. Headache and left arm pain. Found down.

EXAM:
CT HEAD WITHOUT CONTRAST
TECHNIQUE: Contiguous axial images were obtained from the base of the skull
through the vertex without intravenous contrast.

[Series 3: head wo · axial · 0.42mm/px · z∈[-158,-18]mm · 9 of 34 slices shown, 12 images]
[im 3/34  brain]
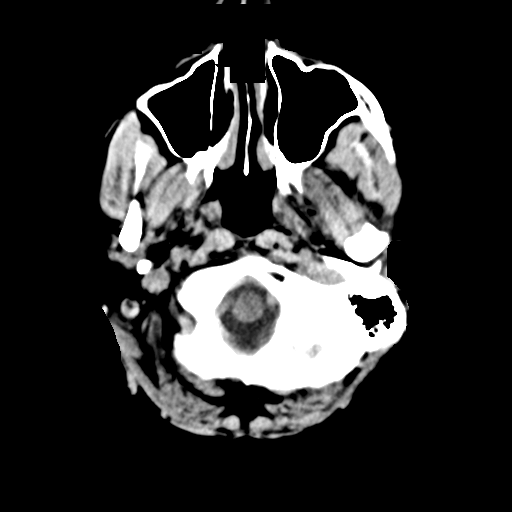
[im 3/34  bone]
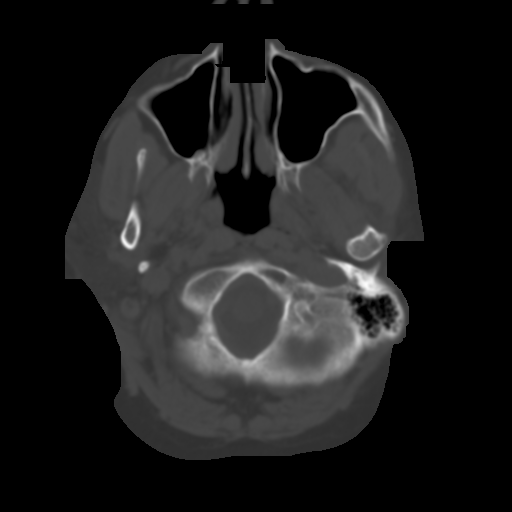
[im 8/34  brain]
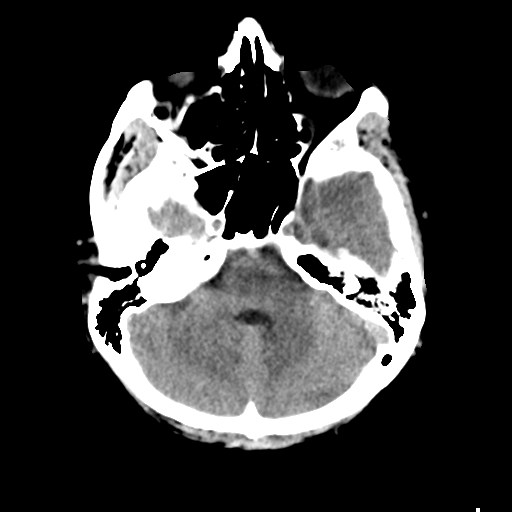
[im 10/34  brain]
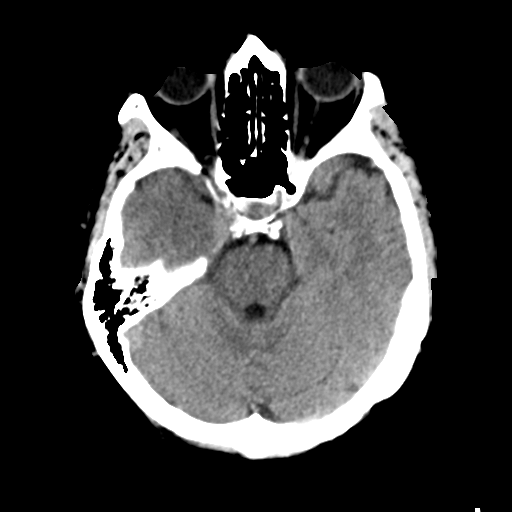
[im 15/34  brain]
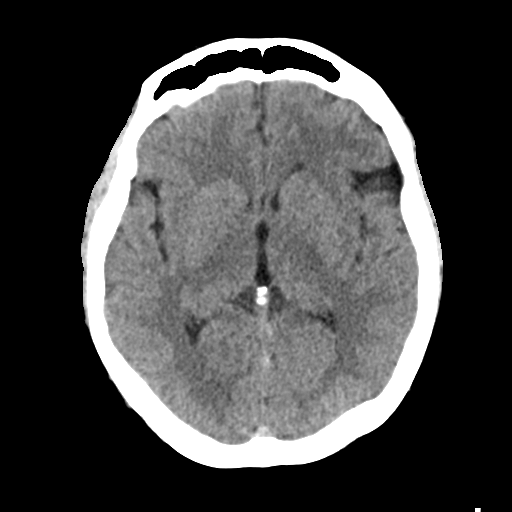
[im 17/34  brain]
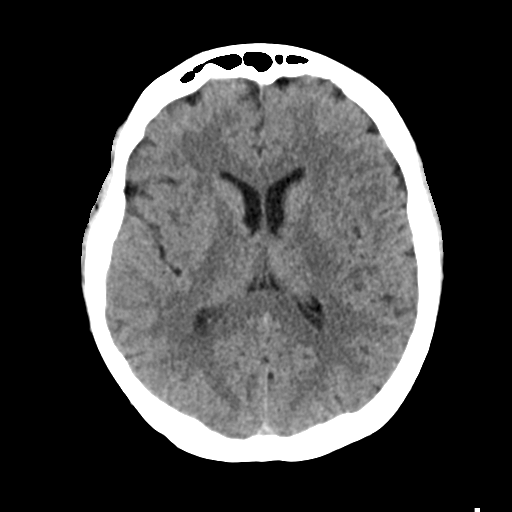
[im 17/34  bone]
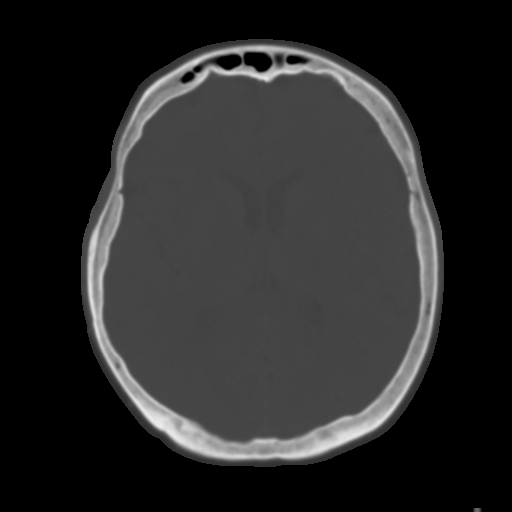
[im 19/34  brain]
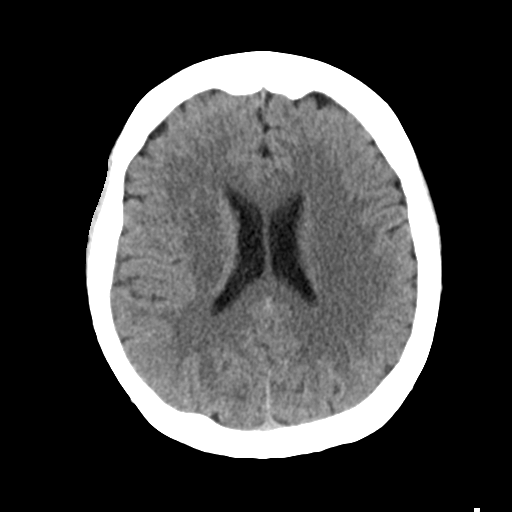
[im 24/34  brain]
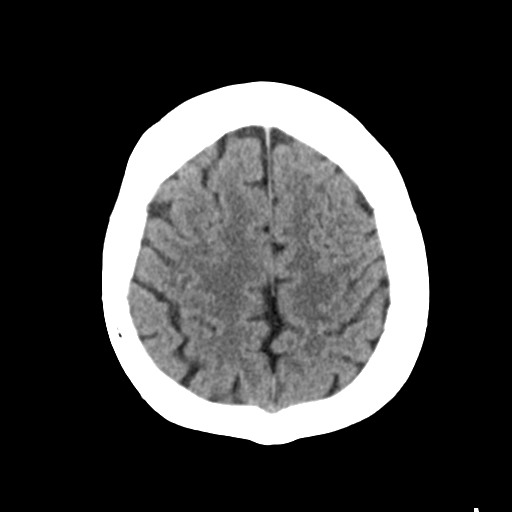
[im 26/34  brain]
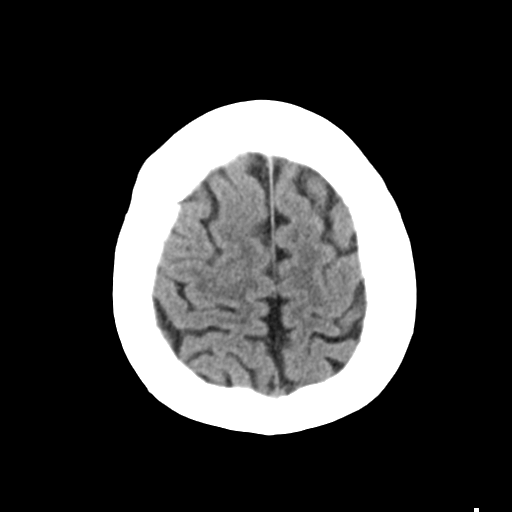
[im 31/34  brain]
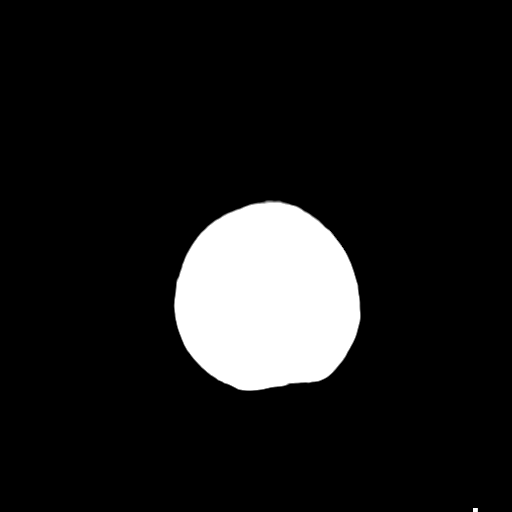
[im 31/34  bone]
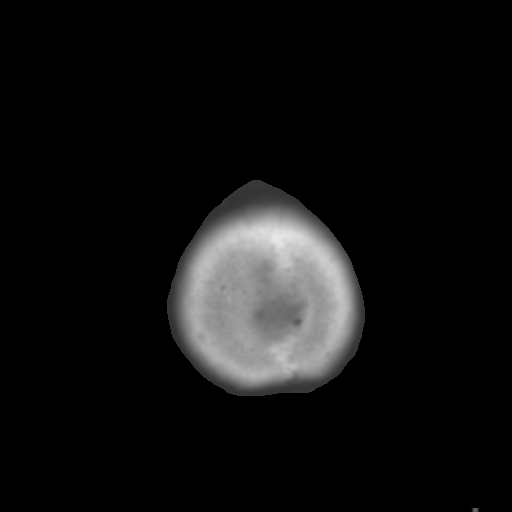

[Series 5: coronal soft tissue · coronal · 0.34mm/px · 3 of 66 slices shown]
[im 22/66  brain]
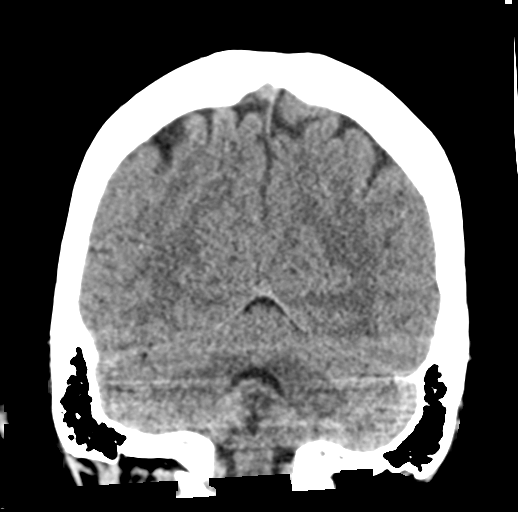
[im 29/66  brain]
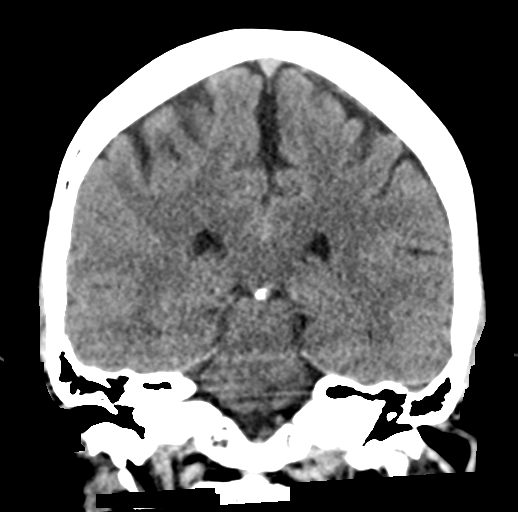
[im 37/66  brain]
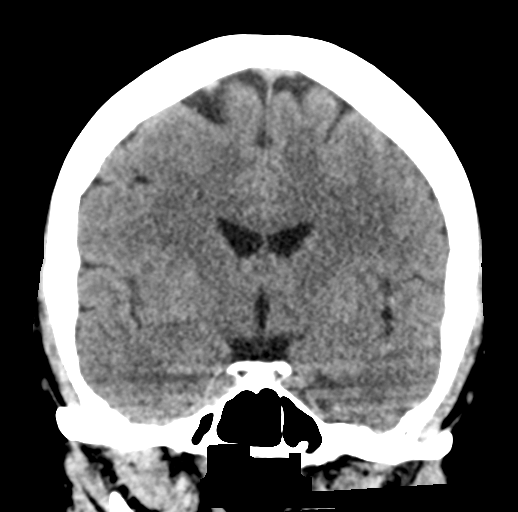

[Series 6: sagittal soft tissue · sagittal · 0.34mm/px · 3 of 56 slices shown]
[im 19/56  brain]
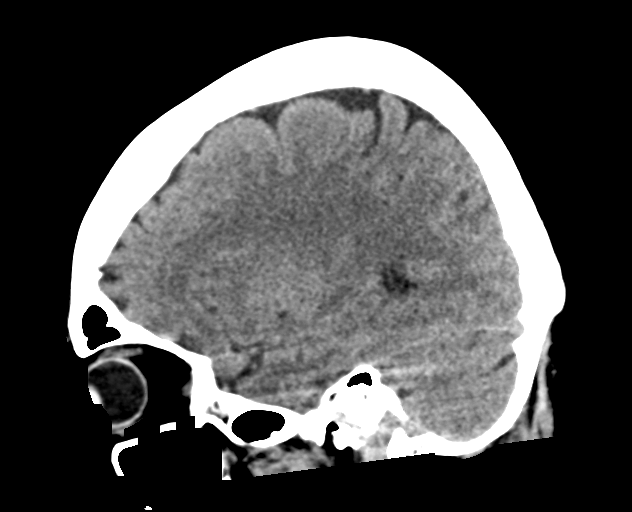
[im 28/56  brain]
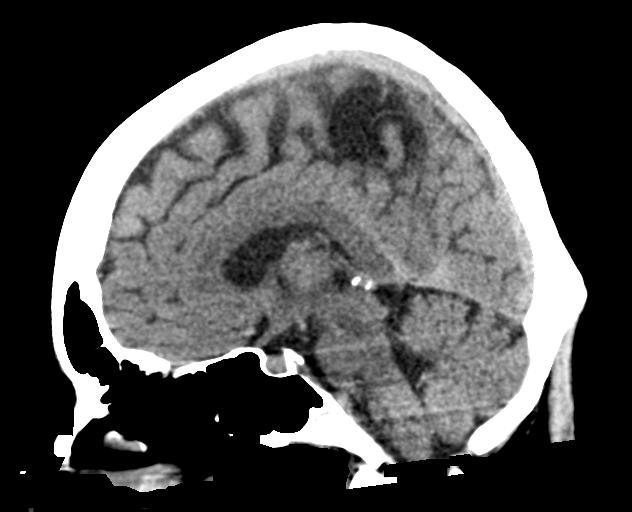
[im 37/56  brain]
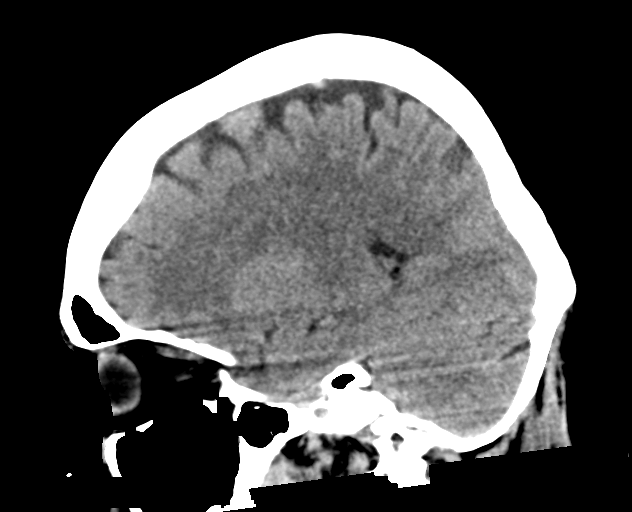

[15 of 47 positions shown; findings below may reference images not displayed]

FINDINGS: Brain: Cerebral volume is stable since 8181 and within normal limits
for age. No midline shift, ventriculomegaly, mass effect, evidence
of mass lesion, intracranial hemorrhage or evidence of cortically
based acute infarction. Gray-white matter differentiation is within
normal limits throughout the brain.

Vascular: No suspicious intracranial vascular hyperdensity.

Skull: Stable, negative.

Sinuses/Orbits: Scattered small chronic paranasal sinus mucous
retention cysts. Overall paranasal sinuses and mastoids are stable
and well aerated.

Other: Visualized orbits and scalp soft tissues are within normal
limits.

ASPECTS ([place]ta Stroke Program Early CT Score)

Total score (0-10 with 10 being normal): 10
IMPRESSION: 1. Stable since 8181 and normal for age non contrast CT appearance
of the brain. ASPECTS 10.
2. These results were communicated to Dr. Beny at [DATE] on
04/30/2021 by text page via the [employer] messaging system.

## 2023-10-19 ENCOUNTER — Encounter: Payer: Self-pay | Admitting: Physical Therapy

## 2023-10-19 ENCOUNTER — Ambulatory Visit: Payer: BC Managed Care – PPO | Attending: Hand Surgery | Admitting: Physical Therapy

## 2023-10-19 DIAGNOSIS — Z9889 Other specified postprocedural states: Secondary | ICD-10-CM | POA: Diagnosis present

## 2023-10-19 DIAGNOSIS — G8929 Other chronic pain: Secondary | ICD-10-CM | POA: Insufficient documentation

## 2023-10-19 DIAGNOSIS — M25512 Pain in left shoulder: Secondary | ICD-10-CM | POA: Insufficient documentation

## 2023-10-19 DIAGNOSIS — M25612 Stiffness of left shoulder, not elsewhere classified: Secondary | ICD-10-CM | POA: Diagnosis present

## 2023-10-19 NOTE — Therapy (Signed)
OUTPATIENT PHYSICAL THERAPY SHOULDER EVALUATION   Patient Name: Hannah Cross MRN: [id number] DOB:1956-04-17, 67 y.o., female Today's Date: 10/19/2023  END OF SESSION:  PT End of Session - 10/19/23 1529     Visit Number 1    Number of Visits 16    Date for PT Re-Evaluation 12/14/23    PT Start Time 0730    PT Stop Time 0810    PT Time Calculation (min) 40 min    Activity Tolerance Patient limited by pain    Behavior During Therapy [facility] for tasks assessed/performed             History reviewed. No pertinent past medical history. Past Surgical History:  Procedure Laterality Date   ABDOMINAL HYSTERECTOMY     OOPHORECTOMY     Patient Active Problem List   Diagnosis Date Noted   Syncope 08/06/2019    PCP: Titus Mould, NP  REFERRING PROVIDER: Klifto, Denyse Dago, MD  REFERRING DIAG: Incomplete rotator cuff tear or rupture of left shoulder, not specified as traumatic; adhesive capsulitis of left shoulder  THERAPY DIAG:  S/P arthroscopy of left shoulder  Chronic left shoulder pain  Stiffness of left shoulder, not elsewhere classified  S/P left rotator cuff repair  Rationale for Evaluation and Treatment: Rehabilitation  ONSET DATE: 10/13/2023    SUBJECTIVE:                                                                                                                                                                                      SUBJECTIVE STATEMENT: Pt reports to PT s/p L shoulder arthroscopy with rotator cuff repair, extensive debridement, tenodesis of long tendon of biceps, decompression of subacromial space with partial acromioplasty and distal claviculectomy (10/13/2023). Pt reports dealing with chronic bilateral shoulder pain for most of this year and received injections in both shoulders (L side in July); pt reported improvement in pain with her R shoulder but no improvement with her left, which is what resulted in her having the  surgery. She says her pain is usually worse at night and she is having trouble sleeping because of the pain. Pt reports all her sensation has returned since the numbing block wore off earlier this week.  Hand dominance: Right  PERTINENT HISTORY: Adhesive capsulitis, stress fx of L fibula (September 2024)  PAIN:  Are you having pain? Yes: NPRS scale: 7/10 now, 9/10 worst, 2/10 best Pain location: L shoulder Pain description: Sharp, burning Aggravating factors: Movement, having shoulder at rest without support Relieving factors: Ibuprofen  PRECAUTIONS: Shoulder  RED FLAGS: None   WEIGHT BEARING RESTRICTIONS: Yes NWB LUE for first 6 weeks after surgery  FALLS:  Has patient fallen in last 6 months? No  LIVING ENVIRONMENT: Lives with: lives with their spouse Lives in: House/apartment Stairs: Yes: External: 3 steps; none Has following equipment at home: None  OCCUPATION: Safety Specialist  PLOF: Independent  PATIENT GOALS:Get normal ROM back with no pain, get back to cooking for her family  NEXT MD VISIT: TBD  OBJECTIVE:  Note: Objective measures were completed at Evaluation unless otherwise noted.  DIAGNOSTIC FINDINGS:  N/A  PATIENT SURVEYS:  FOTO 41/63  COGNITION: Overall cognitive status: Within functional limits for tasks assessed     SENSATION: Not tested (pt reports sensation has returned since numbing block wore off)  POSTURE: No significant postural limitations noted  UPPER EXTREMITY ROM:   Passive/Active ROM Right Eval (Active)/In sitting Left eval  Shoulder flexion 166 90 (abiding by protocol)  Shoulder extension 75   Shoulder abduction 160   Shoulder adduction    Shoulder internal rotation [facility]   Shoulder external rotation WFL ~10 degrees (abiding by protocol)  Elbow flexion [facility] WFL  Elbow extension [facility] WFL  Wrist flexion    Wrist extension    Wrist ulnar deviation    Wrist radial deviation    Wrist pronation    Wrist supination    (Blank  rows = not tested)  UPPER EXTREMITY MMT:  MMT Right eval Left eval  Shoulder flexion 5   Shoulder extension 5   Shoulder abduction 5   Shoulder adduction    Shoulder internal rotation 5   Shoulder external rotation 4   Middle trapezius    Lower trapezius    Elbow flexion    Elbow extension    Wrist flexion    Wrist extension    Wrist ulnar deviation    Wrist radial deviation    Wrist pronation    Wrist supination    Grip strength (lbs) 36.9 lbs average   (Blank rows = not tested)     TODAY'S TREATMENT:                                                                                                                                         DATE: 10/19/2023  L Shoulder Flexion/ER light PROM stretches x 15 minutes - pt limited to ~90 degrees flexion due to protocol, limited to ~10 degrees ER due to pain   PATIENT EDUCATION: Education details: Role of PT, PT plan of care, icing Person educated: Patient Education method: Explanation and Demonstration Education comprehension: verbalized understanding  HOME EXERCISE PROGRAM: To be given after 2 week marker to abide by surgical protocol (after 10/27/2023)  ASSESSMENT:  CLINICAL IMPRESSION: Patient is a 67 y.o. female who is s/p L shoulder arthroscopy with rotator cuff repair, extensive debridement, tenodesis of long tendon of biceps, decompression of subacromial space with partial acromioplasty and distal claviculectomy (10/13/2023). Pt's L shoulder A/PROM is currently limited at this time due to surgical protocol and  pain, with the most limited being external rotation at this time. Pt educated on role of PT moving forward as well as expectations on her plan of care. Pt instructed to continue icing her shoulder to help with pain and to continue wearing her sling 24/7 as indicated by the protocol, until she reaches the 4 week mark (11/10/2023). Pt will benefit from continued PT services upon discharge to safely address deficits  listed in patient problem list for decreased caregiver assistance and eventual return to PLOF.   OBJECTIVE IMPAIRMENTS: decreased ROM, decreased strength, hypomobility, impaired UE functional use, and pain.   ACTIVITY LIMITATIONS: carrying, bathing, dressing, reach over head, and hygiene/grooming  PARTICIPATION LIMITATIONS: meal prep, cleaning, laundry, driving, community activity, occupation, and yard work  PERSONAL FACTORS: Age and 1 comorbidity: adhesive capsulitis  are also affecting patient's functional outcome.   REHAB POTENTIAL: Good  CLINICAL DECISION MAKING: Stable/uncomplicated  EVALUATION COMPLEXITY: Low   GOALS:   SHORT TERM GOALS: Target date: 11/16/2023  Pt will initiate PROM HEP after 10/27/2023 in order to improve L shoulder AROM while abiding by surgical protocols. Baseline: Initiate 2 weeks post-surgery (10/13/2023) Goal status: INITIAL  2.  Pt will begin to wean off sling use after 11/10/2023 in order to restore functional mobility in her L shoulder while abiding by surgical protocols. Baseline: Initiate 4 weeks post-surgery (10/13/2023) Goal status: INITIAL  3.  Pt will demonstrate 90 degrees of L shoulder flexion PROM and 30 degrees of L shoulder ER PROM by 11/02/2023 in order to achieve surgeon-specific PROM goals. Baseline: N/A Goal status: INITIAL   LONG TERM GOALS: Target date: 12/13/2022  Pt will improve FOTO score to at least 63 in order to demonstrate improvement in pain and function with ADL's. Baseline: 41 Goal status: INITIAL  2.  Pt will achieve 120 degrees of L shoulder flexion PROM and 45 degrees of L shoulder ER PROM in order to achieve surgeon-specific PROM goals.  Baseline: N/A Goal status: INITIAL  3.  Pt will report being able to function without sling by week 6-8 (12/12 - 12/26) in order improve function and use of her L UE. Baseline: N/A Goal status: INITIAL  4.  Pt will improve L shoulder MMT scores to at least 4/5 to  demonstrate improvement in L shoulder strength. Baseline: Not tested, to be assessed when appropriate going by protocol (post-op month 3 = January 31st) Goal status: INITIAL  5.  Pt will report <2/10 pain on NPS scale in order to show clinically significant improvement in pain at rest and with mobility. Baseline: 7/10 pain at rest Goal status: INITIAL   PLAN:  PT FREQUENCY: 2x/week  PT DURATION: 8 weeks  PLANNED INTERVENTIONS: 97164- PT Re-evaluation, 97110-Therapeutic exercises, 97530- Therapeutic activity, 97112- Neuromuscular re-education, 97140- Manual therapy, Joint mobilization, Cryotherapy, and Moist heat  PLAN FOR NEXT SESSION: Shoulder PROM abiding by protocol, assess incision site  Cammie Mcgee, PT, DPT # [id number] [place], Student-PT 10/19/2023, 3:32 PM

## 2023-10-21 ENCOUNTER — Encounter: Payer: Self-pay | Admitting: Physical Therapy

## 2023-10-21 ENCOUNTER — Ambulatory Visit: Payer: BC Managed Care – PPO | Admitting: Physical Therapy

## 2023-10-21 DIAGNOSIS — Z9889 Other specified postprocedural states: Secondary | ICD-10-CM | POA: Diagnosis not present

## 2023-10-21 DIAGNOSIS — G8929 Other chronic pain: Secondary | ICD-10-CM

## 2023-10-21 DIAGNOSIS — M25612 Stiffness of left shoulder, not elsewhere classified: Secondary | ICD-10-CM

## 2023-10-21 NOTE — Therapy (Signed)
OUTPATIENT PHYSICAL THERAPY SHOULDER TREATMENT   Patient Name: Hannah Cross MRN: [id number] DOB:10-31-1956, 67 y.o., female Today's Date: 10/21/2023  END OF SESSION:  PT End of Session - 10/21/23 0818     Visit Number 2    Number of Visits 16    Date for PT Re-Evaluation 12/14/23    PT Start Time 0817            0817 to 0901  (44 minutes).    Past Surgical History:  Procedure Laterality Date   ABDOMINAL HYSTERECTOMY     OOPHORECTOMY     Patient Active Problem List   Diagnosis Date Noted   Syncope 08/06/2019    PCP: Titus Mould, NP  REFERRING PROVIDER: Klifto, Denyse Dago, MD  REFERRING DIAG: Incomplete rotator cuff tear or rupture of left shoulder, not specified as traumatic; adhesive capsulitis of left shoulder  THERAPY DIAG:  S/P arthroscopy of left shoulder  Chronic left shoulder pain  Stiffness of left shoulder, not elsewhere classified  S/P left rotator cuff repair  Rationale for Evaluation and Treatment: Rehabilitation  ONSET DATE: 10/13/2023    SUBJECTIVE:                                                                                                                                                                                      SUBJECTIVE STATEMENT: Pt reports to PT s/p L shoulder arthroscopy with rotator cuff repair, extensive debridement, tenodesis of long tendon of biceps, decompression of subacromial space with partial acromioplasty and distal claviculectomy (10/13/2023). Pt reports dealing with chronic bilateral shoulder pain for most of this year and received injections in both shoulders (L side in July); pt reported improvement in pain with her R shoulder but no improvement with her left, which is what resulted in her having the surgery. She says her pain is usually worse at night and she is having trouble sleeping because of the pain. Pt reports all her sensation has returned since the numbing block wore off earlier  this week.  Hand dominance: Right  PERTINENT HISTORY: Adhesive capsulitis, stress fx of L fibula (September 2024)  PAIN:  Are you having pain? Yes: NPRS scale: 7/10 now, 9/10 worst, 2/10 best Pain location: L shoulder Pain description: Sharp, burning Aggravating factors: Movement, having shoulder at rest without support Relieving factors: Ibuprofen  PRECAUTIONS: Shoulder  RED FLAGS: None   WEIGHT BEARING RESTRICTIONS: Yes NWB LUE for first 6 weeks after surgery  FALLS:  Has patient fallen in last 6 months? No  LIVING ENVIRONMENT: Lives with: lives with their spouse Lives in: House/apartment Stairs: Yes: External: 3 steps; none Has following equipment at home: None  OCCUPATION: Safety Specialist  PLOF: Independent  PATIENT GOALS:Get normal ROM back with no pain, get back to cooking for her family  NEXT MD VISIT: TBD  OBJECTIVE:  Note: Objective measures were completed at Evaluation unless otherwise noted.  DIAGNOSTIC FINDINGS:  N/A  PATIENT SURVEYS:  FOTO 41/63  COGNITION: Overall cognitive status: Within functional limits for tasks assessed     SENSATION: Not tested (pt reports sensation has returned since numbing block wore off)  POSTURE: No significant postural limitations noted  UPPER EXTREMITY ROM:   Passive/Active ROM Right Eval (Active)/In sitting Left eval  Shoulder flexion 166 90 (abiding by protocol)  Shoulder extension 75   Shoulder abduction 160   Shoulder adduction    Shoulder internal rotation [facility]   Shoulder external rotation WFL ~10 degrees (abiding by protocol)  Elbow flexion [facility] WFL  Elbow extension [facility] WFL  Wrist flexion    Wrist extension    Wrist ulnar deviation    Wrist radial deviation    Wrist pronation    Wrist supination    (Blank rows = not tested)  UPPER EXTREMITY MMT:  MMT Right eval Left eval  Shoulder flexion 5   Shoulder extension 5   Shoulder abduction 5   Shoulder adduction    Shoulder internal  rotation 5   Shoulder external rotation 4   Middle trapezius    Lower trapezius    Elbow flexion    Elbow extension    Wrist flexion    Wrist extension    Wrist ulnar deviation    Wrist radial deviation    Wrist pronation    Wrist supination    Grip strength (lbs) 36.9 lbs average   (Blank rows = not tested)     TODAY'S TREATMENT:                                                                                                                                         DATE: 10/21/2023  Subjective:  Pt. Reports falling the other day while going up stairs in front of house.  Pt. Tripped over slippers and grazed L shoulder but no reported injury except L knee contusion/ scrap.  Pt. States she called ortho MD to report recent fall.  Pt. Reports 5/10 L anterior shoulder pain currently at rest.    Manual tx.:  Supine L Shoulder Flexion/ER light PROM stretches (3 sets of 12 reps with holds)- pt limited to ~90 degrees flexion due to protocol, limited to <10 degrees ER due to pain.  Supine L elbow AA/PROM/ L hand ball squeezing.    Supine STM to L cervical/ deltoid musculature.    Ice to L shoulder at end of tx.     PATIENT EDUCATION: Education details: Role of PT, PT plan of care, icing Person educated: Patient Education method: Explanation and Demonstration Education comprehension: verbalized understanding  HOME EXERCISE PROGRAM: To be given after  2 week marker to abide by surgical protocol (after 10/27/2023)  ASSESSMENT:  CLINICAL IMPRESSION: Pt's L shoulder A/PROM is currently limited at this time due to surgical protocol and pain, with the most limited being external rotation at this time. Pt educated on role of PT moving forward as well as expectations on her plan of care. Pt instructed to continue icing her shoulder to help with pain and to continue wearing her sling 24/7 as indicated by the protocol, until she reaches the 4 week mark (11/10/2023). Pt will benefit from continued  PT services upon discharge to safely address deficits listed in patient problem list for decreased caregiver assistance and eventual return to PLOF.   OBJECTIVE IMPAIRMENTS: decreased ROM, decreased strength, hypomobility, impaired UE functional use, and pain.   ACTIVITY LIMITATIONS: carrying, bathing, dressing, reach over head, and hygiene/grooming  PARTICIPATION LIMITATIONS: meal prep, cleaning, laundry, driving, community activity, occupation, and yard work  PERSONAL FACTORS: Age and 1 comorbidity: adhesive capsulitis  are also affecting patient's functional outcome.   REHAB POTENTIAL: Good  CLINICAL DECISION MAKING: Stable/uncomplicated  EVALUATION COMPLEXITY: Low   GOALS:   SHORT TERM GOALS: Target date: 11/16/2023  Pt will initiate PROM HEP after 10/27/2023 in order to improve L shoulder AROM while abiding by surgical protocols. Baseline: Initiate 2 weeks post-surgery (10/13/2023) Goal status: INITIAL  2.  Pt will begin to wean off sling use after 11/10/2023 in order to restore functional mobility in her L shoulder while abiding by surgical protocols. Baseline: Initiate 4 weeks post-surgery (10/13/2023) Goal status: INITIAL  3.  Pt will demonstrate 90 degrees of L shoulder flexion PROM and 30 degrees of L shoulder ER PROM by 11/02/2023 in order to achieve surgeon-specific PROM goals. Baseline: N/A Goal status: INITIAL   LONG TERM GOALS: Target date: 12/13/2022  Pt will improve FOTO score to at least 63 in order to demonstrate improvement in pain and function with ADL's. Baseline: 41 Goal status: INITIAL  2.  Pt will achieve 120 degrees of L shoulder flexion PROM and 45 degrees of L shoulder ER PROM in order to achieve surgeon-specific PROM goals.  Baseline: N/A Goal status: INITIAL  3.  Pt will report being able to function without sling by week 6-8 (12/12 - 12/26) in order improve function and use of her L UE. Baseline: N/A Goal status: INITIAL  4.  Pt will  improve L shoulder MMT scores to at least 4/5 to demonstrate improvement in L shoulder strength. Baseline: Not tested, to be assessed when appropriate going by protocol (post-op month 3 = January 31st) Goal status: INITIAL  5.  Pt will report <2/10 pain on NPS scale in order to show clinically significant improvement in pain at rest and with mobility. Baseline: 7/10 pain at rest Goal status: INITIAL   PLAN:  PT FREQUENCY: 2x/week  PT DURATION: 8 weeks  PLANNED INTERVENTIONS: 97164- PT Re-evaluation, 97110-Therapeutic exercises, 97530- Therapeutic activity, 97112- Neuromuscular re-education, 97140- Manual therapy, Joint mobilization, Cryotherapy, and Moist heat  PLAN FOR NEXT SESSION: Shoulder PROM abiding by protocol, assess incision site.  Issue HEP  Cammie Mcgee, PT, DPT # [id number] 10/21/2023, 8:19 AM

## 2023-10-26 ENCOUNTER — Ambulatory Visit: Payer: BC Managed Care – PPO | Admitting: Physical Therapy

## 2023-10-26 ENCOUNTER — Encounter: Payer: Self-pay | Admitting: Physical Therapy

## 2023-10-26 DIAGNOSIS — Z9889 Other specified postprocedural states: Secondary | ICD-10-CM

## 2023-10-26 DIAGNOSIS — G8929 Other chronic pain: Secondary | ICD-10-CM

## 2023-10-26 DIAGNOSIS — M25612 Stiffness of left shoulder, not elsewhere classified: Secondary | ICD-10-CM

## 2023-10-26 NOTE — Therapy (Signed)
OUTPATIENT PHYSICAL THERAPY SHOULDER TREATMENT   Patient Name: Hannah Cross MRN: [id number] DOB:05/31/1956, 67 y.o., female Today's Date: 10/26/2023  END OF SESSION:  PT End of Session - 10/26/23 0820     Visit Number 3    Number of Visits 16    Date for PT Re-Evaluation 12/14/23    PT Start Time 0725    PT Stop Time 0814    PT Time Calculation (min) 49 min    Activity Tolerance Patient limited by pain    Behavior During Therapy [facility] for tasks assessed/performed             Past Surgical History:  Procedure Laterality Date   ABDOMINAL HYSTERECTOMY     OOPHORECTOMY     Patient Active Problem List   Diagnosis Date Noted   Syncope 08/06/2019    PCP: Titus Mould, NP  REFERRING PROVIDER: Fletcher Anon, MD  REFERRING DIAG: Incomplete rotator cuff tear or rupture of left shoulder, not specified as traumatic; adhesive capsulitis of left shoulder  THERAPY DIAG:  S/P arthroscopy of left shoulder  Chronic left shoulder pain  Stiffness of left shoulder, not elsewhere classified  S/P left rotator cuff repair  Rationale for Evaluation and Treatment: Rehabilitation  ONSET DATE: 10/13/2023    SUBJECTIVE:                                                                                                                                                                                      SUBJECTIVE STATEMENT: Pt reports to PT s/p L shoulder arthroscopy with rotator cuff repair, extensive debridement, tenodesis of long tendon of biceps, decompression of subacromial space with partial acromioplasty and distal claviculectomy (10/13/2023). Pt reports dealing with chronic bilateral shoulder pain for most of this year and received injections in both shoulders (L side in July); pt reported improvement in pain with her R shoulder but no improvement with her left, which is what resulted in her having the surgery. She says her pain is usually worse at night and  she is having trouble sleeping because of the pain. Pt reports all her sensation has returned since the numbing block wore off earlier this week.  Hand dominance: Right  PERTINENT HISTORY: Adhesive capsulitis, stress fx of L fibula (September 2024)  PAIN:  Are you having pain? Yes: NPRS scale: 7/10 now, 9/10 worst, 2/10 best Pain location: L shoulder Pain description: Sharp, burning Aggravating factors: Movement, having shoulder at rest without support Relieving factors: Ibuprofen  PRECAUTIONS: Shoulder  RED FLAGS: None   WEIGHT BEARING RESTRICTIONS: Yes NWB LUE for first 6 weeks after surgery  FALLS:  Has patient fallen in last 6  months? No  LIVING ENVIRONMENT: Lives with: lives with their spouse Lives in: House/apartment Stairs: Yes: External: 3 steps; none Has following equipment at home: None  OCCUPATION: Safety Specialist  PLOF: Independent  PATIENT GOALS:Get normal ROM back with no pain, get back to cooking for her family  NEXT MD VISIT: TBD  OBJECTIVE:  Note: Objective measures were completed at Evaluation unless otherwise noted.  DIAGNOSTIC FINDINGS:  N/A  PATIENT SURVEYS:  FOTO 41/63  COGNITION: Overall cognitive status: Within functional limits for tasks assessed     SENSATION: Not tested (pt reports sensation has returned since numbing block wore off)  POSTURE: No significant postural limitations noted  UPPER EXTREMITY ROM:   Passive/Active ROM Right Eval (Active)/In sitting Left eval  Shoulder flexion 166 90 (abiding by protocol)  Shoulder extension 75   Shoulder abduction 160   Shoulder adduction    Shoulder internal rotation [facility]   Shoulder external rotation WFL ~10 degrees (abiding by protocol)  Elbow flexion [facility] WFL  Elbow extension [facility] WFL  Wrist flexion    Wrist extension    Wrist ulnar deviation    Wrist radial deviation    Wrist pronation    Wrist supination    (Blank rows = not tested)  UPPER EXTREMITY MMT:  MMT  Right eval Left eval  Shoulder flexion 5   Shoulder extension 5   Shoulder abduction 5   Shoulder adduction    Shoulder internal rotation 5   Shoulder external rotation 4   Middle trapezius    Lower trapezius    Elbow flexion    Elbow extension    Wrist flexion    Wrist extension    Wrist ulnar deviation    Wrist radial deviation    Wrist pronation    Wrist supination    Grip strength (lbs) 36.9 lbs average   (Blank rows = not tested)     TODAY'S TREATMENT:                                                                                                                                         DATE: 10/26/2023  Subjective:  Pt reports no falls or stumbles since last week. She says her back is a little sore from sleeping last night. 4/10 pain and soreness in her L shoulder right now. She says she goes to see her doctor tomorrow for a check-in.   Manual tx.: Supine L Shoulder Flexion/ER light PROM stretches x 25 minutes- pt limited to ~90 degrees flexion due to protocol, limited to <15 degrees ER due to pain.  Supine L elbow and wrist AA/PROM x 5 minutes  Pt introduced and educated on initial post-op HEP: elbow/wrist/hand AROM without weight, PROM for elbow flexion/supination, scapular retractions, cervical AROM, supported pendulums - HEP provided at end of session  Ice to L shoulder at end of tx x 10 minutes   PATIENT EDUCATION:  Education details: Role of PT, PT plan of care, icing Person educated: Patient Education method: Explanation and Demonstration Education comprehension: verbalized understanding  HOME EXERCISE PROGRAM: Access Code: [email or web address] URL: [email or web address] Date: 10/26/2023 Prepared by: Dorene Grebe Exercises - Seated Scapular Retraction - 2 x daily - 7 x weekly - 1 sets - 10 reps - Seated Cervical Rotation AROM - 2 x daily - 7 x weekly - 1 sets - 10 reps - Seated Cervical Sidebending AROM - 2 x daily - 7 x weekly - 1 sets - 10 reps - Standing  Circular Shoulder Pendulum Supported with Arm Bent - 2 x daily - 7 x weekly - 1 sets - 10 reps - Seated Elbow Flexion AAROM - 2 x daily - 7 x weekly - 1 sets - 10 reps - Seated AAROM Elbow Supination/Pronation with Clasped Hands - 2 x daily - 7 x weekly - 1 sets - 10 reps   ASSESSMENT:  CLINICAL IMPRESSION: Pt's L shoulder A/PROM is currently limited at this time due to surgical protocol and pain. Pt able to reach 90 degrees passively for flexion however experiences increased L anterior shoulder pain around that range. Pt continues to be most limited with external rotation due to pain, she is able to get ~15 degrees. Pt educated on implementing initial post-op HEP. Ice applied to L shoulder at end of session to decrease pain. Pt will benefit from continued PT services upon discharge to safely address deficits listed in patient problem list for decreased caregiver assistance and eventual return to PLOF.   OBJECTIVE IMPAIRMENTS: decreased ROM, decreased strength, hypomobility, impaired UE functional use, and pain.   ACTIVITY LIMITATIONS: carrying, bathing, dressing, reach over head, and hygiene/grooming  PARTICIPATION LIMITATIONS: meal prep, cleaning, laundry, driving, community activity, occupation, and yard work  PERSONAL FACTORS: Age and 1 comorbidity: adhesive capsulitis  are also affecting patient's functional outcome.   REHAB POTENTIAL: Good  CLINICAL DECISION MAKING: Stable/uncomplicated  EVALUATION COMPLEXITY: Low   GOALS:   SHORT TERM GOALS: Target date: 11/16/2023  Pt will initiate PROM HEP after 10/27/2023 in order to improve L shoulder AROM while abiding by surgical protocols. Baseline: Initiate 2 weeks post-surgery (10/13/2023) Goal status: INITIAL  2.  Pt will begin to wean off sling use after 11/10/2023 in order to restore functional mobility in her L shoulder while abiding by surgical protocols. Baseline: Initiate 4 weeks post-surgery (10/13/2023) Goal status:  INITIAL  3.  Pt will demonstrate 90 degrees of L shoulder flexion PROM and 30 degrees of L shoulder ER PROM by 11/02/2023 in order to achieve surgeon-specific PROM goals. Baseline: N/A Goal status: INITIAL   LONG TERM GOALS: Target date: 12/13/2022  Pt will improve FOTO score to at least 63 in order to demonstrate improvement in pain and function with ADL's. Baseline: 41 Goal status: INITIAL  2.  Pt will achieve 120 degrees of L shoulder flexion PROM and 45 degrees of L shoulder ER PROM in order to achieve surgeon-specific PROM goals.  Baseline: N/A Goal status: INITIAL  3.  Pt will report being able to function without sling by week 6-8 (12/12 - 12/26) in order improve function and use of her L UE. Baseline: N/A Goal status: INITIAL  4.  Pt will improve L shoulder MMT scores to at least 4/5 to demonstrate improvement in L shoulder strength. Baseline: Not tested, to be assessed when appropriate going by protocol (post-op month 3 = January 31st) Goal status: INITIAL  5.  Pt will report <2/10 pain on  NPS scale in order to show clinically significant improvement in pain at rest and with mobility. Baseline: 7/10 pain at rest Goal status: INITIAL   PLAN:  PT FREQUENCY: 2x/week  PT DURATION: 8 weeks  PLANNED INTERVENTIONS: 97164- PT Re-evaluation, 97110-Therapeutic exercises, 97530- Therapeutic activity, 97112- Neuromuscular re-education, 97140- Manual therapy, Joint mobilization, Cryotherapy, and Moist heat  PLAN FOR NEXT SESSION: Shoulder PROM abiding by protocol, assess incision site.  Follow MD protocol.  Issue HEP, follow-up on MD visit from Thursday  Cammie Mcgee, PT, DPT # [phone] Cena Benton, SPT 10/26/23, 8:27 AM

## 2023-10-28 ENCOUNTER — Encounter: Payer: Self-pay | Admitting: Physical Therapy

## 2023-10-28 ENCOUNTER — Ambulatory Visit: Payer: BC Managed Care – PPO | Admitting: Physical Therapy

## 2023-10-28 DIAGNOSIS — Z9889 Other specified postprocedural states: Secondary | ICD-10-CM | POA: Diagnosis not present

## 2023-10-28 DIAGNOSIS — M25612 Stiffness of left shoulder, not elsewhere classified: Secondary | ICD-10-CM

## 2023-10-28 DIAGNOSIS — G8929 Other chronic pain: Secondary | ICD-10-CM

## 2023-10-28 NOTE — Therapy (Signed)
OUTPATIENT PHYSICAL THERAPY SHOULDER TREATMENT   Patient Name: Hannah Cross MRN: [id number] DOB:1956-12-03, 67 y.o., female Today's Date: 10/28/2023  END OF SESSION:  PT End of Session - 10/28/23 0739     Visit Number 4    Number of Visits 16    Date for PT Re-Evaluation 12/14/23    PT Start Time 0811    PT Stop Time 0905    PT Time Calculation (min) 54 min             Past Surgical History:  Procedure Laterality Date   ABDOMINAL HYSTERECTOMY     OOPHORECTOMY     Patient Active Problem List   Diagnosis Date Noted   Syncope 08/06/2019    PCP: Titus Mould, NP  REFERRING PROVIDER: Fletcher Anon, MD  REFERRING DIAG: Incomplete rotator cuff tear or rupture of left shoulder, not specified as traumatic; adhesive capsulitis of left shoulder  THERAPY DIAG:  S/P arthroscopy of left shoulder  Chronic left shoulder pain  Stiffness of left shoulder, not elsewhere classified  S/P left rotator cuff repair  Rationale for Evaluation and Treatment: Rehabilitation  ONSET DATE: 10/13/2023    SUBJECTIVE:                                                                                                                                                                                      SUBJECTIVE STATEMENT: Pt reports to PT s/p L shoulder arthroscopy with rotator cuff repair, extensive debridement, tenodesis of long tendon of biceps, decompression of subacromial space with partial acromioplasty and distal claviculectomy (10/13/2023). Pt reports dealing with chronic bilateral shoulder pain for most of this year and received injections in both shoulders (L side in July); pt reported improvement in pain with her R shoulder but no improvement with her left, which is what resulted in her having the surgery. She says her pain is usually worse at night and she is having trouble sleeping because of the pain. Pt reports all her sensation has returned since the  numbing block wore off earlier this week.  Hand dominance: Right  PERTINENT HISTORY: Adhesive capsulitis, stress fx of L fibula (September 2024)  PAIN:  Are you having pain? Yes: NPRS scale: 7/10 now, 9/10 worst, 2/10 best Pain location: L shoulder Pain description: Sharp, burning Aggravating factors: Movement, having shoulder at rest without support Relieving factors: Ibuprofen  PRECAUTIONS: Shoulder  RED FLAGS: None   WEIGHT BEARING RESTRICTIONS: Yes NWB LUE for first 6 weeks after surgery  FALLS:  Has patient fallen in last 6 months? No  LIVING ENVIRONMENT: Lives with: lives with their spouse Lives in: House/apartment Stairs: Yes: External: 3 steps;  none Has following equipment at home: None  OCCUPATION: Safety Specialist  PLOF: Independent  PATIENT GOALS:Get normal ROM back with no pain, get back to cooking for her family  NEXT MD VISIT: TBD  OBJECTIVE:  Note: Objective measures were completed at Evaluation unless otherwise noted.  DIAGNOSTIC FINDINGS:  N/A  PATIENT SURVEYS:  FOTO 41/63  COGNITION: Overall cognitive status: Within functional limits for tasks assessed     SENSATION: Not tested (pt reports sensation has returned since numbing block wore off)  POSTURE: No significant postural limitations noted  UPPER EXTREMITY ROM:   Passive/Active ROM Right Eval (Active)/In sitting Left eval  Shoulder flexion 166 90 (abiding by protocol)  Shoulder extension 75   Shoulder abduction 160   Shoulder adduction    Shoulder internal rotation [facility]   Shoulder external rotation WFL ~10 degrees (abiding by protocol)  Elbow flexion [facility] WFL  Elbow extension [facility] WFL  Wrist flexion    Wrist extension    Wrist ulnar deviation    Wrist radial deviation    Wrist pronation    Wrist supination    (Blank rows = not tested)  UPPER EXTREMITY MMT:  MMT Right eval Left eval  Shoulder flexion 5   Shoulder extension 5   Shoulder abduction 5   Shoulder  adduction    Shoulder internal rotation 5   Shoulder external rotation 4   Middle trapezius    Lower trapezius    Elbow flexion    Elbow extension    Wrist flexion    Wrist extension    Wrist ulnar deviation    Wrist radial deviation    Wrist pronation    Wrist supination    Grip strength (lbs) 36.9 lbs average   (Blank rows = not tested)     TODAY'S TREATMENT:                                                                                                                                         DATE: 10/28/2023  Subjective:  Pt. Had f/u with MD office yesterday.  Stitches removed and no issues reported.  Pt. Reports 1/10 L shoulder pain currently at rest.  Pt. Wearing sling and instructed to wear for 2 more weeks.  Pt. Returns to MD in 4 weeks.    Manual tx.: Supine L Shoulder Flexion/ER light PROM stretches 10x3 with 5-10 second holds.  Flexion limited to ~90 degrees flexion due to protocol and ER limited to <30 degrees (pain).    Supine L elbow and wrist AA/PROM x 5 minutes  Supine L shoulder PROM: flexion/ ER with wand in scaption position.    Pt educated on shoulder protocol/ HEP: elbow/wrist/hand AROM without weight, PROM for elbow flexion/supination, scapular retractions, cervical AROM, supported pendulums - Reviewed HEP provided at end of session  There.ex.:  See updated HEP.    Standing table top B shoulder flexion (PROM  for L) 10x    Pt. Will ice L shoulder at home.     PATIENT EDUCATION: Education details: Role of PT, PT plan of care, icing Person educated: Patient Education method: Explanation and Demonstration Education comprehension: verbalized understanding  HOME EXERCISE PROGRAM: Access Code: [email or web address] URL: [email or web address] Date: 10/26/2023 Prepared by: Dorene Grebe Exercises - Seated Scapular Retraction - 2 x daily - 7 x weekly - 1 sets - 10 reps - Seated Cervical Rotation AROM - 2 x daily - 7 x weekly - 1 sets - 10 reps - Seated  Cervical Sidebending AROM - 2 x daily - 7 x weekly - 1 sets - 10 reps - Standing Circular Shoulder Pendulum Supported with Arm Bent - 2 x daily - 7 x weekly - 1 sets - 10 reps - Seated Elbow Flexion AAROM - 2 x daily - 7 x weekly - 1 sets - 10 reps - Seated AAROM Elbow Supination/Pronation with Clasped Hands - 2 x daily - 7 x weekly - 1 sets - 10 reps  Access Code: [email or web address] URL: [email or web address] Date: 10/28/2023 Prepared by: Dorene Grebe  Exercises - Seated Scapular Retraction  - 2 x daily - 7 x weekly - 1 sets - 10 reps - Seated Cervical Rotation AROM  - 2 x daily - 7 x weekly - 1 sets - 10 reps - Seated Cervical Sidebending AROM  - 2 x daily - 7 x weekly - 1 sets - 10 reps - Standing Circular Shoulder Pendulum Supported with Arm Bent  - 2 x daily - 7 x weekly - 1 sets - 10 reps - Seated Elbow Flexion AAROM  - 2 x daily - 7 x weekly - 1 sets - 10 reps - Seated Shoulder Flexion Self PROM  - 3 x daily - 7 x weekly - 1 sets - 10 reps - 5-10 second hold - Supine Shoulder External Rotation with Dowel  - 3 x daily - 7 x weekly - 1 sets - 10 reps - 5-10 seconds hold   ASSESSMENT:  CLINICAL IMPRESSION: Pt's L shoulder PROM is currently limited at this time due to surgical protocol and pain. Good incision healing with stitches removed yesterday.  Pt continues to be most limited with external rotation due to pain, she is able to get ~20-30 degrees passively. Pt educated on progression of MD protocol/ HEP.  Pt. Will continue to ice L shoulder after ex. And use sling for 2 more weeks. Pt will benefit from continued PT services upon discharge to safely address deficits listed in patient problem list for decreased caregiver assistance and eventual return to PLOF.   OBJECTIVE IMPAIRMENTS: decreased ROM, decreased strength, hypomobility, impaired UE functional use, and pain.   ACTIVITY LIMITATIONS: carrying, bathing, dressing, reach over head, and hygiene/grooming  PARTICIPATION  LIMITATIONS: meal prep, cleaning, laundry, driving, community activity, occupation, and yard work  PERSONAL FACTORS: Age and 1 comorbidity: adhesive capsulitis  are also affecting patient's functional outcome.   REHAB POTENTIAL: Good  CLINICAL DECISION MAKING: Stable/uncomplicated  EVALUATION COMPLEXITY: Low   GOALS:   SHORT TERM GOALS: Target date: 11/16/2023  Pt will initiate PROM HEP after 10/27/2023 in order to improve L shoulder AROM while abiding by surgical protocols. Baseline: Initiate 2 weeks post-surgery (10/13/2023) Goal status: INITIAL  2.  Pt will begin to wean off sling use after 11/10/2023 in order to restore functional mobility in her L shoulder while abiding by surgical protocols. Baseline: Initiate 4 weeks post-surgery (10/13/2023) Goal status: INITIAL  3.  Pt will demonstrate 90 degrees of L shoulder flexion PROM and 30 degrees of L shoulder ER PROM by 11/02/2023 in order to achieve surgeon-specific PROM goals. Baseline: N/A Goal status: INITIAL   LONG TERM GOALS: Target date: 12/13/2022  Pt will improve FOTO score to at least 63 in order to demonstrate improvement in pain and function with ADL's. Baseline: 41 Goal status: INITIAL  2.  Pt will achieve 120 degrees of L shoulder flexion PROM and 45 degrees of L shoulder ER PROM in order to achieve surgeon-specific PROM goals.  Baseline: N/A Goal status: INITIAL  3.  Pt will report being able to function without sling by week 6-8 (12/12 - 12/26) in order improve function and use of her L UE. Baseline: N/A Goal status: INITIAL  4.  Pt will improve L shoulder MMT scores to at least 4/5 to demonstrate improvement in L shoulder strength. Baseline: Not tested, to be assessed when appropriate going by protocol (post-op month 3 = January 31st) Goal status: INITIAL  5.  Pt will report <2/10 pain on NPS scale in order to show clinically significant improvement in pain at rest and with mobility. Baseline: 7/10  pain at rest Goal status: INITIAL   PLAN:  PT FREQUENCY: 2x/week  PT DURATION: 8 weeks  PLANNED INTERVENTIONS: 97164- PT Re-evaluation, 97110-Therapeutic exercises, 97530- Therapeutic activity, 97112- Neuromuscular re-education, 97140- Manual therapy, Joint mobilization, Cryotherapy, and Moist heat  PLAN FOR NEXT SESSION: Shoulder PROM abiding by protocol, assess incision site.  Follow MD protocol.    Cammie Mcgee, PT, DPT # [id number] 10/28/23, 9:15 AM

## 2023-10-31 ENCOUNTER — Ambulatory Visit: Payer: BC Managed Care – PPO | Admitting: Physical Therapy

## 2023-10-31 ENCOUNTER — Encounter: Payer: Self-pay | Admitting: Physical Therapy

## 2023-10-31 DIAGNOSIS — Z9889 Other specified postprocedural states: Secondary | ICD-10-CM

## 2023-10-31 DIAGNOSIS — G8929 Other chronic pain: Secondary | ICD-10-CM

## 2023-10-31 DIAGNOSIS — M25612 Stiffness of left shoulder, not elsewhere classified: Secondary | ICD-10-CM

## 2023-10-31 NOTE — Therapy (Signed)
OUTPATIENT PHYSICAL THERAPY SHOULDER TREATMENT   Patient Name: Hannah Cross MRN: [id number] DOB:1956-09-26, 67 y.o., female Today's Date: 10/31/2023  END OF SESSION:  PT End of Session - 10/31/23 0723     Visit Number 5    Number of Visits 16    Date for PT Re-Evaluation 12/14/23    PT Start Time 0723    PT Stop Time 0814    PT Time Calculation (min) 51 min             Past Surgical History:  Procedure Laterality Date   ABDOMINAL HYSTERECTOMY     OOPHORECTOMY     Patient Active Problem List   Diagnosis Date Noted   Syncope 08/06/2019    PCP: Titus Mould, NP  REFERRING PROVIDER: Fletcher Anon, MD  REFERRING DIAG: Incomplete rotator cuff tear or rupture of left shoulder, not specified as traumatic; adhesive capsulitis of left shoulder  THERAPY DIAG:  S/P arthroscopy of left shoulder  Chronic left shoulder pain  Stiffness of left shoulder, not elsewhere classified  S/P left rotator cuff repair  Rationale for Evaluation and Treatment: Rehabilitation  ONSET DATE: 10/13/2023    SUBJECTIVE:                                                                                                                                                                                      SUBJECTIVE STATEMENT: Pt reports to PT s/p L shoulder arthroscopy with rotator cuff repair, extensive debridement, tenodesis of long tendon of biceps, decompression of subacromial space with partial acromioplasty and distal claviculectomy (10/13/2023). Pt reports dealing with chronic bilateral shoulder pain for most of this year and received injections in both shoulders (L side in July); pt reported improvement in pain with her R shoulder but no improvement with her left, which is what resulted in her having the surgery. She says her pain is usually worse at night and she is having trouble sleeping because of the pain. Pt reports all her sensation has returned since the  numbing block wore off earlier this week.  Hand dominance: Right  PERTINENT HISTORY: Adhesive capsulitis, stress fx of L fibula (September 2024)  PAIN:  Are you having pain? Yes: NPRS scale: 7/10 now, 9/10 worst, 2/10 best Pain location: L shoulder Pain description: Sharp, burning Aggravating factors: Movement, having shoulder at rest without support Relieving factors: Ibuprofen  PRECAUTIONS: Shoulder  RED FLAGS: None   WEIGHT BEARING RESTRICTIONS: Yes NWB LUE for first 6 weeks after surgery  FALLS:  Has patient fallen in last 6 months? No  LIVING ENVIRONMENT: Lives with: lives with their spouse Lives in: House/apartment Stairs: Yes: External: 3 steps;  none Has following equipment at home: None  OCCUPATION: Safety Specialist  PLOF: Independent  PATIENT GOALS:Get normal ROM back with no pain, get back to cooking for her family  NEXT MD VISIT: TBD  OBJECTIVE:  Note: Objective measures were completed at Evaluation unless otherwise noted.  DIAGNOSTIC FINDINGS:  N/A  PATIENT SURVEYS:  FOTO 41/63  COGNITION: Overall cognitive status: Within functional limits for tasks assessed     SENSATION: Not tested (pt reports sensation has returned since numbing block wore off)  POSTURE: No significant postural limitations noted  UPPER EXTREMITY ROM:   Passive/Active ROM Right Eval (Active)/In sitting Left eval  Shoulder flexion 166 90 (abiding by protocol)  Shoulder extension 75   Shoulder abduction 160   Shoulder adduction    Shoulder internal rotation [facility]   Shoulder external rotation WFL ~10 degrees (abiding by protocol)  Elbow flexion [facility] WFL  Elbow extension [facility] WFL  Wrist flexion    Wrist extension    Wrist ulnar deviation    Wrist radial deviation    Wrist pronation    Wrist supination    (Blank rows = not tested)  UPPER EXTREMITY MMT:  MMT Right eval Left eval  Shoulder flexion 5   Shoulder extension 5   Shoulder abduction 5   Shoulder  adduction    Shoulder internal rotation 5   Shoulder external rotation 4   Middle trapezius    Lower trapezius    Elbow flexion    Elbow extension    Wrist flexion    Wrist extension    Wrist ulnar deviation    Wrist radial deviation    Wrist pronation    Wrist supination    Grip strength (lbs) 36.9 lbs average   (Blank rows = not tested)     TODAY'S TREATMENT:                                                                                                                                         DATE: 10/31/2023  Subjective:  Pt. Reports 5/10 L shoulder pain currently at rest.  Pt. Wearing sling and instructed to start weaning out of sling in 2 more weeks.  Pt. Returns to MD in 3 weeks.    There.ex.:  Reviewed HEP/ standing scap. Retraction with L elbow extension at side/ reassessment of cervical AROM (all planes).    Standing blue mat table with blue ball B shoulder flexion (PROM for L) 10x2 (90 deg. Sh. Flexion).    Standing supported L shoulder pendulum 10x.  Manual tx.:  Supine (elevated head position on mat table) L Shoulder Flexion/ abduction/ ER light PROM stretches 10x3 with 5-10 second holds.  Flexion/ abduction limited to ~90 degrees flexion due to protocol and ER limited to <30 degrees (pain).    Supine L elbow flexion/ extension/ supination/ pronation AA/PROM x 6 minutes  Supine L shoulder PROM (PT assist): flexion/  ER with wand in scaption position 10x2.    Supine L UT/shoulder/bicep gentle STM between L shoulder PROM sets.    Pt educated on shoulder protocol/ HEP: elbow/wrist/hand AROM without weight, PROM for elbow flexion/supination, scapular retractions, cervical AROM, supported pendulums - Reviewed HEP provided at end of session.    Pt. Will ice L shoulder at home.     PATIENT EDUCATION: Education details: Role of PT, PT plan of care, icing Person educated: Patient Education method: Explanation and Demonstration Education comprehension: verbalized  understanding  HOME EXERCISE PROGRAM: Access Code: [email or web address] URL: [email or web address] Date: 10/26/2023 Prepared by: Dorene Grebe Exercises - Seated Scapular Retraction - 2 x daily - 7 x weekly - 1 sets - 10 reps - Seated Cervical Rotation AROM - 2 x daily - 7 x weekly - 1 sets - 10 reps - Seated Cervical Sidebending AROM - 2 x daily - 7 x weekly - 1 sets - 10 reps - Standing Circular Shoulder Pendulum Supported with Arm Bent - 2 x daily - 7 x weekly - 1 sets - 10 reps - Seated Elbow Flexion AAROM - 2 x daily - 7 x weekly - 1 sets - 10 reps - Seated AAROM Elbow Supination/Pronation with Clasped Hands - 2 x daily - 7 x weekly - 1 sets - 10 reps  Access Code: [email or web address] URL: [email or web address] Date: 10/28/2023 Prepared by: Dorene Grebe  Exercises - Seated Scapular Retraction  - 2 x daily - 7 x weekly - 1 sets - 10 reps - Seated Cervical Rotation AROM  - 2 x daily - 7 x weekly - 1 sets - 10 reps - Seated Cervical Sidebending AROM  - 2 x daily - 7 x weekly - 1 sets - 10 reps - Standing Circular Shoulder Pendulum Supported with Arm Bent  - 2 x daily - 7 x weekly - 1 sets - 10 reps - Seated Elbow Flexion AAROM  - 2 x daily - 7 x weekly - 1 sets - 10 reps - Seated Shoulder Flexion Self PROM  - 3 x daily - 7 x weekly - 1 sets - 10 reps - 5-10 second hold - Supine Shoulder External Rotation with Dowel  - 3 x daily - 7 x weekly - 1 sets - 10 reps - 5-10 seconds hold   ASSESSMENT:  CLINICAL IMPRESSION: Pt's L shoulder PROM is currently limited at this time due to surgical protocol and pain. Good incision healing with minimal tenderness over L anterior sh.  Pt continues to be most limited with external rotation due to pain, she is able to get ~20-30 degrees passively. Pt educated on progression of MD protocol/ HEP.  Pt. Will continue to ice L shoulder after ex. And use sling for 2 more weeks. Pt will benefit from continued PT services upon discharge to safely address  deficits listed in patient problem list for decreased caregiver assistance and eventual return to PLOF.   OBJECTIVE IMPAIRMENTS: decreased ROM, decreased strength, hypomobility, impaired UE functional use, and pain.   ACTIVITY LIMITATIONS: carrying, bathing, dressing, reach over head, and hygiene/grooming  PARTICIPATION LIMITATIONS: meal prep, cleaning, laundry, driving, community activity, occupation, and yard work  PERSONAL FACTORS: Age and 1 comorbidity: adhesive capsulitis  are also affecting patient's functional outcome.   REHAB POTENTIAL: Good  CLINICAL DECISION MAKING: Stable/uncomplicated  EVALUATION COMPLEXITY: Low   GOALS:   SHORT TERM GOALS: Target date: 11/16/2023  Pt will initiate PROM HEP after 10/27/2023 in order to improve L shoulder AROM while abiding by  surgical protocols. Baseline: Initiate 2 weeks post-surgery (10/13/2023) Goal status: INITIAL  2.  Pt will begin to wean off sling use after 11/10/2023 in order to restore functional mobility in her L shoulder while abiding by surgical protocols. Baseline: Initiate 4 weeks post-surgery (10/13/2023) Goal status: INITIAL  3.  Pt will demonstrate 90 degrees of L shoulder flexion PROM and 30 degrees of L shoulder ER PROM by 11/02/2023 in order to achieve surgeon-specific PROM goals. Baseline: N/A Goal status: INITIAL   LONG TERM GOALS: Target date: 12/13/2022  Pt will improve FOTO score to at least 63 in order to demonstrate improvement in pain and function with ADL's. Baseline: 41 Goal status: INITIAL  2.  Pt will achieve 120 degrees of L shoulder flexion PROM and 45 degrees of L shoulder ER PROM in order to achieve surgeon-specific PROM goals.  Baseline: N/A Goal status: INITIAL  3.  Pt will report being able to function without sling by week 6-8 (12/12 - 12/26) in order improve function and use of her L UE. Baseline: N/A Goal status: INITIAL  4.  Pt will improve L shoulder MMT scores to at least 4/5 to  demonstrate improvement in L shoulder strength. Baseline: Not tested, to be assessed when appropriate going by protocol (post-op month 3 = January 31st) Goal status: INITIAL  5.  Pt will report <2/10 pain on NPS scale in order to show clinically significant improvement in pain at rest and with mobility. Baseline: 7/10 pain at rest Goal status: INITIAL   PLAN:  PT FREQUENCY: 2x/week  PT DURATION: 8 weeks  PLANNED INTERVENTIONS: 97164- PT Re-evaluation, 97110-Therapeutic exercises, 97530- Therapeutic activity, 97112- Neuromuscular re-education, 97140- Manual therapy, Joint mobilization, Cryotherapy, and Moist heat  PLAN FOR NEXT SESSION: Shoulder PROM abiding by protocol, assess incision site.  Follow MD protocol.    Cammie Mcgee, PT, DPT # [phone] 10/31/23, 8:15 AM

## 2023-11-02 ENCOUNTER — Ambulatory Visit: Payer: BC Managed Care – PPO

## 2023-11-02 DIAGNOSIS — M25612 Stiffness of left shoulder, not elsewhere classified: Secondary | ICD-10-CM

## 2023-11-02 DIAGNOSIS — G8929 Other chronic pain: Secondary | ICD-10-CM

## 2023-11-02 DIAGNOSIS — Z9889 Other specified postprocedural states: Secondary | ICD-10-CM

## 2023-11-02 NOTE — Therapy (Signed)
OUTPATIENT PHYSICAL THERAPY SHOULDER TREATMENT   Patient Name: Hannah Cross MRN: [id number] DOB:1956/05/06, 67 y.o., female Today's Date: 11/02/2023  END OF SESSION:  PT End of Session - 11/02/23 0829     Visit Number 6    Number of Visits 16    Date for PT Re-Evaluation 12/14/23    PT Start Time 0731    PT Stop Time 0820    PT Time Calculation (min) 49 min    Activity Tolerance Patient limited by pain    Behavior During Therapy [facility] for tasks assessed/performed              Past Surgical History:  Procedure Laterality Date   ABDOMINAL HYSTERECTOMY     OOPHORECTOMY     Patient Active Problem List   Diagnosis Date Noted   Syncope 08/06/2019    PCP: Titus Mould, NP  REFERRING PROVIDER: Fletcher Anon, MD  REFERRING DIAG: Incomplete rotator cuff tear or rupture of left shoulder, not specified as traumatic; adhesive capsulitis of left shoulder  THERAPY DIAG:  S/P arthroscopy of left shoulder  Chronic left shoulder pain  Stiffness of left shoulder, not elsewhere classified  S/P left rotator cuff repair  Rationale for Evaluation and Treatment: Rehabilitation  ONSET DATE: 10/13/2023    SUBJECTIVE:                                                                                                                                                                                      SUBJECTIVE STATEMENT: Pt reports to PT s/p L shoulder arthroscopy with rotator cuff repair, extensive debridement, tenodesis of long tendon of biceps, decompression of subacromial space with partial acromioplasty and distal claviculectomy (10/13/2023). Pt reports dealing with chronic bilateral shoulder pain for most of this year and received injections in both shoulders (L side in July); pt reported improvement in pain with her R shoulder but no improvement with her left, which is what resulted in her having the surgery. She says her pain is usually worse at night  and she is having trouble sleeping because of the pain. Pt reports all her sensation has returned since the numbing block wore off earlier this week.  Hand dominance: Right  PERTINENT HISTORY: Adhesive capsulitis, stress fx of L fibula (September 2024)  PAIN:  Are you having pain? Yes: NPRS scale: 7/10 now, 9/10 worst, 2/10 best Pain location: L shoulder Pain description: Sharp, burning Aggravating factors: Movement, having shoulder at rest without support Relieving factors: Ibuprofen  PRECAUTIONS: Shoulder  RED FLAGS: None   WEIGHT BEARING RESTRICTIONS: Yes NWB LUE for first 6 weeks after surgery  FALLS:  Has patient fallen in last  6 months? No  LIVING ENVIRONMENT: Lives with: lives with their spouse Lives in: House/apartment Stairs: Yes: External: 3 steps; none Has following equipment at home: None  OCCUPATION: Safety Specialist  PLOF: Independent  PATIENT GOALS:Get normal ROM back with no pain, get back to cooking for her family  NEXT MD VISIT: TBD  OBJECTIVE:  Note: Objective measures were completed at Evaluation unless otherwise noted.  DIAGNOSTIC FINDINGS:  N/A  PATIENT SURVEYS:  FOTO 41/63  COGNITION: Overall cognitive status: Within functional limits for tasks assessed     SENSATION: Not tested (pt reports sensation has returned since numbing block wore off)  POSTURE: No significant postural limitations noted  UPPER EXTREMITY ROM:   Passive/Active ROM Right Eval (Active)/In sitting Left eval  Shoulder flexion 166 90 (abiding by protocol)  Shoulder extension 75   Shoulder abduction 160   Shoulder adduction    Shoulder internal rotation [facility]   Shoulder external rotation WFL ~10 degrees (abiding by protocol)  Elbow flexion [facility] WFL  Elbow extension [facility] WFL  Wrist flexion    Wrist extension    Wrist ulnar deviation    Wrist radial deviation    Wrist pronation    Wrist supination    (Blank rows = not tested)  UPPER EXTREMITY MMT:  MMT  Right eval Left eval  Shoulder flexion 5   Shoulder extension 5   Shoulder abduction 5   Shoulder adduction    Shoulder internal rotation 5   Shoulder external rotation 4   Middle trapezius    Lower trapezius    Elbow flexion    Elbow extension    Wrist flexion    Wrist extension    Wrist ulnar deviation    Wrist radial deviation    Wrist pronation    Wrist supination    Grip strength (lbs) 36.9 lbs average   (Blank rows = not tested)     TODAY'S TREATMENT:                                                                                                                                         DATE: 11/02/2023  Subjective:  Pt. Continues to Report 5/10 L shoulder pain currently at rest.  4/10 after session. Pt. Wearing sling and instructed to start weaning out of sling in 2 more weeks.  Pt. Returns to MD in 3 weeks.  Pt sleeps with unbuckled Sling and elevated and supported LUE while in her recliner.    There.ex.:  Reviewed HEP/ standing scap. Retraction with L elbow extension at side/ reassessment of cervical AROM (all planes).   AAROM with wand in Flex/Abd/ER/IR within the limits of [place].  Scapular retraction 3 x 10 reps Shoulder shrugs with posterior roll 3 x 10 reps Posture correction activities with chin tuck.  Pt education about the significance of good posture for proper healing.  Standing supported L shoulder pendulum 10x. AROM  to Elbow in neutral position 2 x 10 reps AROM to R wrist in all planes 3 x 10 reps    Manual tx.:  Jt PROM in all planes as per protocol. Supine (elevated head position on mat table) L Shoulder Flexion/ abduction/ ER light PROM stretches 3 x 60 secs with hold fro 3 secs.  Flexion/ abduction limited to ~110/90 degrees due to protocol and ER limited to ~30 degrees (pain).   Supine/L elbow flexion/ extension/ supination/ pronation AA/PROM x 6 minutes  upine L UT/shoulder/bicep gentle STM between L shoulder PROM sets.    Pt re-in forced Pt  educated on shoulder protocol/ HEP: elbow/wrist/hand AROM without weight, PROM for elbow flexion/supination, scapular retractions, cervical AROM, supported pendulums - Reviewed HEP provided at end of session.    Pt. Will ice L shoulder at home.     PATIENT EDUCATION: Education details: Role of PT, PT plan of care, icing Person educated: Patient Education method: Explanation and Demonstration Education comprehension: verbalized understanding  HOME EXERCISE PROGRAM: Access Code: [email or web address] URL: [email or web address] Date: 10/26/2023 Prepared by: Dorene Grebe Exercises - Seated Scapular Retraction - 2 x daily - 7 x weekly - 1 sets - 10 reps - Seated Cervical Rotation AROM - 2 x daily - 7 x weekly - 1 sets - 10 reps - Seated Cervical Sidebending AROM - 2 x daily - 7 x weekly - 1 sets - 10 reps - Standing Circular Shoulder Pendulum Supported with Arm Bent - 2 x daily - 7 x weekly - 1 sets - 10 reps - Seated Elbow Flexion AAROM - 2 x daily - 7 x weekly - 1 sets - 10 reps - Seated AAROM Elbow Supination/Pronation with Clasped Hands - 2 x daily - 7 x weekly - 1 sets - 10 reps  Access Code: [email or web address] URL: [email or web address] Date: 10/28/2023 Prepared by: Dorene Grebe  Exercises - Seated Scapular Retraction  - 2 x daily - 7 x weekly - 1 sets - 10 reps - Seated Cervical Rotation AROM  - 2 x daily - 7 x weekly - 1 sets - 10 reps - Seated Cervical Sidebending AROM  - 2 x daily - 7 x weekly - 1 sets - 10 reps - Standing Circular Shoulder Pendulum Supported with Arm Bent  - 2 x daily - 7 x weekly - 1 sets - 10 reps - Seated Elbow Flexion AAROM  - 2 x daily - 7 x weekly - 1 sets - 10 reps - Seated Shoulder Flexion Self PROM  - 3 x daily - 7 x weekly - 1 sets - 10 reps - 5-10 second hold - Supine Shoulder External Rotation with Dowel  - 3 x daily - 7 x weekly - 1 sets - 10 reps - 5-10 seconds hold   ASSESSMENT:  CLINICAL IMPRESSION: Pt's L shoulder PROM is currently  limited at this time due to surgical protocol and pain. Good incision healing with minimal tenderness over L anterior sh.  Pt continues to be most limited with external rotation and abduction due to pain, she is able to get ~30 degrees passively. PT reviewed education on progression of MD protocol/ HEP.  Pt. Will continue to ice L shoulder after ex. And use sling for 2 more weeks. Pt will benefit from continued PT services upon discharge to safely address deficits listed in patient problem list for decreased caregiver assistance and eventual return to PLOF.   OBJECTIVE IMPAIRMENTS: decreased ROM, decreased strength, hypomobility, impaired UE functional use, and pain.  ACTIVITY LIMITATIONS: carrying, bathing, dressing, reach over head, and hygiene/grooming  PARTICIPATION LIMITATIONS: meal prep, cleaning, laundry, driving, community activity, occupation, and yard work  PERSONAL FACTORS: Age and 1 comorbidity: adhesive capsulitis  are also affecting patient's functional outcome.   REHAB POTENTIAL: Good  CLINICAL DECISION MAKING: Stable/uncomplicated  EVALUATION COMPLEXITY: Low   GOALS:   SHORT TERM GOALS: Target date: 11/16/2023  Pt will initiate PROM HEP after 10/27/2023 in order to improve L shoulder AROM while abiding by surgical protocols. Baseline: Initiate 2 weeks post-surgery (10/13/2023) Goal status: INITIAL  2.  Pt will begin to wean off sling use after 11/10/2023 in order to restore functional mobility in her L shoulder while abiding by surgical protocols. Baseline: Initiate 4 weeks post-surgery (10/13/2023) Goal status: INITIAL  3.  Pt will demonstrate 90 degrees of L shoulder flexion PROM and 30 degrees of L shoulder ER PROM by 11/02/2023 in order to achieve surgeon-specific PROM goals. Baseline: N/A Goal status: INITIAL   LONG TERM GOALS: Target date: 12/13/2022  Pt will improve FOTO score to at least 63 in order to demonstrate improvement in pain and function with  ADL's. Baseline: 41 Goal status: INITIAL  2.  Pt will achieve 120 degrees of L shoulder flexion PROM and 45 degrees of L shoulder ER PROM in order to achieve surgeon-specific PROM goals.  Baseline: N/A Goal status: INITIAL  3.  Pt will report being able to function without sling by week 6-8 (12/12 - 12/26) in order improve function and use of her L UE. Baseline: N/A Goal status: INITIAL  4.  Pt will improve L shoulder MMT scores to at least 4/5 to demonstrate improvement in L shoulder strength. Baseline: Not tested, to be assessed when appropriate going by protocol (post-op month 3 = January 31st) Goal status: INITIAL  5.  Pt will report <2/10 pain on NPS scale in order to show clinically significant improvement in pain at rest and with mobility. Baseline: 7/10 pain at rest Goal status: INITIAL   PLAN:  PT FREQUENCY: 2x/week  PT DURATION: 8 weeks  PLANNED INTERVENTIONS: 97164- PT Re-evaluation, 97110-Therapeutic exercises, 97530- Therapeutic activity, 97112- Neuromuscular re-education, 97140- Manual therapy, Joint mobilization, Cryotherapy, and Moist heat  PLAN FOR NEXT SESSION: Shoulder PROM abiding by protocol, assess incision site.  Follow MD protocol.    Janet Berlin PT DPT 8:33 AM,11/02/23

## 2023-11-04 ENCOUNTER — Encounter: Payer: Self-pay | Admitting: Physical Therapy

## 2023-11-07 ENCOUNTER — Ambulatory Visit: Payer: BC Managed Care – PPO | Admitting: Physical Therapy

## 2023-11-07 DIAGNOSIS — G8929 Other chronic pain: Secondary | ICD-10-CM

## 2023-11-07 DIAGNOSIS — Z9889 Other specified postprocedural states: Secondary | ICD-10-CM | POA: Diagnosis not present

## 2023-11-07 DIAGNOSIS — M25612 Stiffness of left shoulder, not elsewhere classified: Secondary | ICD-10-CM

## 2023-11-07 NOTE — Therapy (Signed)
OUTPATIENT PHYSICAL THERAPY SHOULDER TREATMENT   Patient Name: Hannah Cross MRN: [id number] DOB:Jun 04, 1956, 67 y.o., female Today's Date: 11/07/2023  END OF SESSION:  PT End of Session - 11/07/23 0714     Visit Number 7    Number of Visits 16    Date for PT Re-Evaluation 12/14/23    PT Start Time 0729    PT Stop Time 0816    PT Time Calculation (min) 47 min    Activity Tolerance Patient limited by pain    Behavior During Therapy [facility] for tasks assessed/performed              Past Surgical History:  Procedure Laterality Date   ABDOMINAL HYSTERECTOMY     OOPHORECTOMY     Patient Active Problem List   Diagnosis Date Noted   Syncope 08/06/2019    PCP: Titus Mould, NP  REFERRING PROVIDER: Fletcher Anon, MD  REFERRING DIAG: Incomplete rotator cuff tear or rupture of left shoulder, not specified as traumatic; adhesive capsulitis of left shoulder  THERAPY DIAG:  S/P arthroscopy of left shoulder  Chronic left shoulder pain  Stiffness of left shoulder, not elsewhere classified  S/P left rotator cuff repair  Rationale for Evaluation and Treatment: Rehabilitation  ONSET DATE: 10/13/2023    SUBJECTIVE:                                                                                                                                                                                      SUBJECTIVE STATEMENT: Pt reports to PT s/p L shoulder arthroscopy with rotator cuff repair, extensive debridement, tenodesis of long tendon of biceps, decompression of subacromial space with partial acromioplasty and distal claviculectomy (10/13/2023). Pt reports dealing with chronic bilateral shoulder pain for most of this year and received injections in both shoulders (L side in July); pt reported improvement in pain with her R shoulder but no improvement with her left, which is what resulted in her having the surgery. She says her pain is usually worse at night  and she is having trouble sleeping because of the pain. Pt reports all her sensation has returned since the numbing block wore off earlier this week.  Hand dominance: Right  PERTINENT HISTORY: Adhesive capsulitis, stress fx of L fibula (September 2024)  PAIN:  Are you having pain? Yes: NPRS scale: 7/10 now, 9/10 worst, 2/10 best Pain location: L shoulder Pain description: Sharp, burning Aggravating factors: Movement, having shoulder at rest without support Relieving factors: Ibuprofen  PRECAUTIONS: Shoulder  RED FLAGS: None   WEIGHT BEARING RESTRICTIONS: Yes NWB LUE for first 6 weeks after surgery  FALLS:  Has patient fallen in last  6 months? No  LIVING ENVIRONMENT: Lives with: lives with their spouse Lives in: House/apartment Stairs: Yes: External: 3 steps; none Has following equipment at home: None  OCCUPATION: Safety Specialist  PLOF: Independent  PATIENT GOALS:Get normal ROM back with no pain, get back to cooking for her family  NEXT MD VISIT: TBD  OBJECTIVE:  Note: Objective measures were completed at Evaluation unless otherwise noted.  DIAGNOSTIC FINDINGS:  N/A  PATIENT SURVEYS:  FOTO 41/63  COGNITION: Overall cognitive status: Within functional limits for tasks assessed     SENSATION: Not tested (pt reports sensation has returned since numbing block wore off)  POSTURE: No significant postural limitations noted  UPPER EXTREMITY ROM:   Passive/Active ROM Right Eval (Active)/In sitting Left eval  Shoulder flexion 166 90 (abiding by protocol)  Shoulder extension 75   Shoulder abduction 160   Shoulder adduction    Shoulder internal rotation [facility]   Shoulder external rotation WFL ~10 degrees (abiding by protocol)  Elbow flexion [facility] WFL  Elbow extension [facility] WFL  Wrist flexion    Wrist extension    Wrist ulnar deviation    Wrist radial deviation    Wrist pronation    Wrist supination    (Blank rows = not tested)  UPPER EXTREMITY MMT:  MMT  Right eval Left eval  Shoulder flexion 5   Shoulder extension 5   Shoulder abduction 5   Shoulder adduction    Shoulder internal rotation 5   Shoulder external rotation 4   Middle trapezius    Lower trapezius    Elbow flexion    Elbow extension    Wrist flexion    Wrist extension    Wrist ulnar deviation    Wrist radial deviation    Wrist pronation    Wrist supination    Grip strength (lbs) 36.9 lbs average   (Blank rows = not tested)     TODAY'S TREATMENT:                                                                                                                                         DATE: 11/07/2023  Subjective:  Pt. Continues to Report 3-4/10 L shoulder pain currently at rest.  Pt. Wearing sling and instructed to start weaning out of sling next week.  Pt. Reports marked increase in L shoulder pain after last PT tx. (L proximal bicep was 10/10 pain and sore).     Manual tx:  Seated L shoulder PROM in Flex/Abd/ER within the limits of Portocol. 3 sets of 10 reps with holds.    Scapular retraction 3 x 10 reps.  Supine L shoulder PROM flexion and ER in scaption position 10x with 10 sec. Holds.   Reassessment of incisions (recommended use of scar cream on 3 small incisions but not proximal bicep incision).   Pt education about the significance of good posture for proper healing.  Manual tx.:  Supine L elbow flexion/ extension/ supination/ pronation AA/PROM x 5 minutes  Supine L UT/shoulder/bicep gentle STM between L shoulder PROM sets.    Pt re-in forced Pt educated on shoulder protocol/ HEP: elbow/wrist/hand AROM without weight, PROM for elbow flexion/supination, scapular retractions, cervical AROM, supported pendulums - Reviewed HEP provided at end of session.  Ice to L shoulder during L elbow ex.     PATIENT EDUCATION: Education details: Role of PT, PT plan of care, icing Person educated: Patient Education method: Explanation and  Demonstration Education comprehension: verbalized understanding  HOME EXERCISE PROGRAM: Access Code: [email or web address] URL: [email or web address] Date: 10/26/2023 Prepared by: Dorene Grebe Exercises - Seated Scapular Retraction - 2 x daily - 7 x weekly - 1 sets - 10 reps - Seated Cervical Rotation AROM - 2 x daily - 7 x weekly - 1 sets - 10 reps - Seated Cervical Sidebending AROM - 2 x daily - 7 x weekly - 1 sets - 10 reps - Standing Circular Shoulder Pendulum Supported with Arm Bent - 2 x daily - 7 x weekly - 1 sets - 10 reps - Seated Elbow Flexion AAROM - 2 x daily - 7 x weekly - 1 sets - 10 reps - Seated AAROM Elbow Supination/Pronation with Clasped Hands - 2 x daily - 7 x weekly - 1 sets - 10 reps  Access Code: [email or web address] URL: [email or web address] Date: 10/28/2023 Prepared by: Dorene Grebe  Exercises - Seated Scapular Retraction  - 2 x daily - 7 x weekly - 1 sets - 10 reps - Seated Cervical Rotation AROM  - 2 x daily - 7 x weekly - 1 sets - 10 reps - Seated Cervical Sidebending AROM  - 2 x daily - 7 x weekly - 1 sets - 10 reps - Standing Circular Shoulder Pendulum Supported with Arm Bent  - 2 x daily - 7 x weekly - 1 sets - 10 reps - Seated Elbow Flexion AAROM  - 2 x daily - 7 x weekly - 1 sets - 10 reps - Seated Shoulder Flexion Self PROM  - 3 x daily - 7 x weekly - 1 sets - 10 reps - 5-10 second hold - Supine Shoulder External Rotation with Dowel  - 3 x daily - 7 x weekly - 1 sets - 10 reps - 5-10 seconds hold   ASSESSMENT:  CLINICAL IMPRESSION: Pt's L shoulder PROM is currently limited at this time due to surgical protocol and pain. Good incision healing with minimal tenderness over L anterior sh.  Pt continues to be most limited with external rotation and abduction due to pain, she is able to get ~30 degrees passively. PT reviewed education on progression of MD protocol/ HEP.  Pt. Will continue to ice L shoulder after ex. And use sling for 1 more week. Pt will  benefit from continued PT services upon discharge to safely address deficits listed in patient problem list for decreased caregiver assistance and eventual return to PLOF.   OBJECTIVE IMPAIRMENTS: decreased ROM, decreased strength, hypomobility, impaired UE functional use, and pain.   ACTIVITY LIMITATIONS: carrying, bathing, dressing, reach over head, and hygiene/grooming  PARTICIPATION LIMITATIONS: meal prep, cleaning, laundry, driving, community activity, occupation, and yard work  PERSONAL FACTORS: Age and 1 comorbidity: adhesive capsulitis  are also affecting patient's functional outcome.   REHAB POTENTIAL: Good  CLINICAL DECISION MAKING: Stable/uncomplicated  EVALUATION COMPLEXITY: Low   GOALS:   SHORT TERM GOALS: Target date: 11/16/2023  Pt will initiate PROM HEP after 10/27/2023  in order to improve L shoulder AROM while abiding by surgical protocols. Baseline: Initiate 2 weeks post-surgery (10/13/2023) Goal status: INITIAL  2.  Pt will begin to wean off sling use after 11/10/2023 in order to restore functional mobility in her L shoulder while abiding by surgical protocols. Baseline: Initiate 4 weeks post-surgery (10/13/2023) Goal status: INITIAL  3.  Pt will demonstrate 90 degrees of L shoulder flexion PROM and 30 degrees of L shoulder ER PROM by 11/02/2023 in order to achieve surgeon-specific PROM goals. Baseline: N/A Goal status: INITIAL   LONG TERM GOALS: Target date: 12/13/2022  Pt will improve FOTO score to at least 63 in order to demonstrate improvement in pain and function with ADL's. Baseline: 41 Goal status: INITIAL  2.  Pt will achieve 120 degrees of L shoulder flexion PROM and 45 degrees of L shoulder ER PROM in order to achieve surgeon-specific PROM goals.  Baseline: N/A Goal status: INITIAL  3.  Pt will report being able to function without sling by week 6-8 (12/12 - 12/26) in order improve function and use of her L UE. Baseline: N/A Goal status:  INITIAL  4.  Pt will improve L shoulder MMT scores to at least 4/5 to demonstrate improvement in L shoulder strength. Baseline: Not tested, to be assessed when appropriate going by protocol (post-op month 3 = January 31st) Goal status: INITIAL  5.  Pt will report <2/10 pain on NPS scale in order to show clinically significant improvement in pain at rest and with mobility. Baseline: 7/10 pain at rest Goal status: INITIAL   PLAN:  PT FREQUENCY: 2x/week  PT DURATION: 8 weeks  PLANNED INTERVENTIONS: 97164- PT Re-evaluation, 97110-Therapeutic exercises, 97530- Therapeutic activity, 97112- Neuromuscular re-education, 97140- Manual therapy, Joint mobilization, Cryotherapy, and Moist heat  PLAN FOR NEXT SESSION: Shoulder PROM abiding by protocol, assess incision site.  Follow MD protocol.  ISSUE NEW HEP  Cammie Mcgee, PT, DPT # [id number] 8:21 AM,11/07/23

## 2023-11-09 ENCOUNTER — Encounter: Payer: Self-pay | Admitting: Physical Therapy

## 2023-11-09 ENCOUNTER — Ambulatory Visit: Payer: BC Managed Care – PPO | Admitting: Physical Therapy

## 2023-11-09 DIAGNOSIS — Z9889 Other specified postprocedural states: Secondary | ICD-10-CM

## 2023-11-09 DIAGNOSIS — M25612 Stiffness of left shoulder, not elsewhere classified: Secondary | ICD-10-CM

## 2023-11-09 DIAGNOSIS — G8929 Other chronic pain: Secondary | ICD-10-CM

## 2023-11-09 NOTE — Therapy (Signed)
OUTPATIENT PHYSICAL THERAPY SHOULDER TREATMENT  Patient Name: Hannah Cross MRN: [id number] DOB:05/25/1956, 67 y.o., female Today's Date: 11/09/2023  END OF SESSION:  PT End of Session - 11/09/23 0738     Visit Number 8    Number of Visits 16    Date for PT Re-Evaluation 12/14/23    PT Start Time 0816    PT Stop Time 0907    PT Time Calculation (min) 51 min    Activity Tolerance Patient limited by pain    Behavior During Therapy [facility] for tasks assessed/performed            Past Surgical History:  Procedure Laterality Date   ABDOMINAL HYSTERECTOMY     OOPHORECTOMY     Patient Active Problem List   Diagnosis Date Noted   Syncope 08/06/2019    PCP: Titus Mould, NP  REFERRING PROVIDER: Fletcher Anon, MD  REFERRING DIAG: Incomplete rotator cuff tear or rupture of left shoulder, not specified as traumatic; adhesive capsulitis of left shoulder  THERAPY DIAG:  S/P arthroscopy of left shoulder  Chronic left shoulder pain  Stiffness of left shoulder, not elsewhere classified  S/P left rotator cuff repair  Rationale for Evaluation and Treatment: Rehabilitation  ONSET DATE: 10/13/2023    SUBJECTIVE:                                                                                                                                                                                      SUBJECTIVE STATEMENT: Pt reports to PT s/p L shoulder arthroscopy with rotator cuff repair, extensive debridement, tenodesis of long tendon of biceps, decompression of subacromial space with partial acromioplasty and distal claviculectomy (10/13/2023). Pt reports dealing with chronic bilateral shoulder pain for most of this year and received injections in both shoulders (L side in July); pt reported improvement in pain with her R shoulder but no improvement with her left, which is what resulted in her having the surgery. She says her pain is usually worse at night and she  is having trouble sleeping because of the pain. Pt reports all her sensation has returned since the numbing block wore off earlier this week.  Hand dominance: Right  PERTINENT HISTORY: Adhesive capsulitis, stress fx of L fibula (September 2024)  PAIN:  Are you having pain? Yes: NPRS scale: 7/10 now, 9/10 worst, 2/10 best Pain location: L shoulder Pain description: Sharp, burning Aggravating factors: Movement, having shoulder at rest without support Relieving factors: Ibuprofen  PRECAUTIONS: Shoulder  RED FLAGS: None   WEIGHT BEARING RESTRICTIONS: Yes NWB LUE for first 6 weeks after surgery  FALLS:  Has patient fallen in last 6 months? No  LIVING ENVIRONMENT: Lives with: lives with their spouse Lives in: House/apartment Stairs: Yes: External: 3 steps; none Has following equipment at home: None  OCCUPATION: Safety Specialist  PLOF: Independent  PATIENT GOALS:Get normal ROM back with no pain, get back to cooking for her family  NEXT MD VISIT: TBD  OBJECTIVE:  Note: Objective measures were completed at Evaluation unless otherwise noted.  DIAGNOSTIC FINDINGS:  N/A  PATIENT SURVEYS:  FOTO 41/63  COGNITION: Overall cognitive status: Within functional limits for tasks assessed     SENSATION: Not tested (pt reports sensation has returned since numbing block wore off)  POSTURE: No significant postural limitations noted  UPPER EXTREMITY ROM:   Passive/Active ROM Right Eval (Active)/In sitting Left eval  Shoulder flexion 166 90 (abiding by protocol)  Shoulder extension 75   Shoulder abduction 160   Shoulder adduction    Shoulder internal rotation [facility]   Shoulder external rotation WFL ~10 degrees (abiding by protocol)  Elbow flexion [facility] WFL  Elbow extension [facility] WFL  Wrist flexion    Wrist extension    Wrist ulnar deviation    Wrist radial deviation    Wrist pronation    Wrist supination    (Blank rows = not tested)  UPPER EXTREMITY MMT:  MMT  Right eval Left eval  Shoulder flexion 5   Shoulder extension 5   Shoulder abduction 5   Shoulder adduction    Shoulder internal rotation 5   Shoulder external rotation 4   Middle trapezius    Lower trapezius    Elbow flexion    Elbow extension    Wrist flexion    Wrist extension    Wrist ulnar deviation    Wrist radial deviation    Wrist pronation    Wrist supination    Grip strength (lbs) 36.9 lbs average   (Blank rows = not tested)     TODAY'S TREATMENT:                                                                                                                                         DATE: 11/09/2023  Subjective:  Pt. Will be 4 weeks post op tomorrow.  Pt. Reports no L shoulder when she initially wakes up.  Pt. Reports 5/10 L shoulder pain currently while walking into PT clinic.  Pt. Instructed to start weaning out of sling at home but wear sling out in community.     There.ex.:  Reviewed HEP  Scapular retraction 15x.  Walking in clinic/ hallway without use of sling and focus on recip. Gait pattern/ consistent arm swing.    Manual tx:  Seated L shoulder PROM in Flex/Abd/ER within the limits of Portocol. 3 sets of 10 reps with holds.  Pt. Allowed to passively elevate L shoulder to 120 deg./ ER in scapular plane to 45 deg.      Supine L shoulder PROM flexion (120 deg.)  and ER (45 deg.) in scaption position 10x with 10 sec. Holds.   Ice to L shoulder during L elbow ex.     PATIENT EDUCATION: Education details: Role of PT, PT plan of care, icing Person educated: Patient Education method: Explanation and Demonstration Education comprehension: verbalized understanding  HOME EXERCISE PROGRAM: Access Code: [email or web address] URL: [email or web address] Date: 10/26/2023 Prepared by: Dorene Grebe Exercises - Seated Scapular Retraction - 2 x daily - 7 x weekly - 1 sets - 10 reps - Seated Cervical Rotation AROM - 2 x daily - 7 x weekly - 1 sets - 10 reps - Seated  Cervical Sidebending AROM - 2 x daily - 7 x weekly - 1 sets - 10 reps - Standing Circular Shoulder Pendulum Supported with Arm Bent - 2 x daily - 7 x weekly - 1 sets - 10 reps - Seated Elbow Flexion AAROM - 2 x daily - 7 x weekly - 1 sets - 10 reps - Seated AAROM Elbow Supination/Pronation with Clasped Hands - 2 x daily - 7 x weekly - 1 sets - 10 reps  Access Code: [email or web address] URL: [email or web address] Date: 10/28/2023 Prepared by: Dorene Grebe  Exercises - Seated Scapular Retraction  - 2 x daily - 7 x weekly - 1 sets - 10 reps - Seated Cervical Rotation AROM  - 2 x daily - 7 x weekly - 1 sets - 10 reps - Seated Cervical Sidebending AROM  - 2 x daily - 7 x weekly - 1 sets - 10 reps - Standing Circular Shoulder Pendulum Supported with Arm Bent  - 2 x daily - 7 x weekly - 1 sets - 10 reps - Seated Elbow Flexion AAROM  - 2 x daily - 7 x weekly - 1 sets - 10 reps - Seated Shoulder Flexion Self PROM  - 3 x daily - 7 x weekly - 1 sets - 10 reps - 5-10 second hold - Supine Shoulder External Rotation with Dowel  - 3 x daily - 7 x weekly - 1 sets - 10 reps - 5-10 seconds hold   ASSESSMENT:  CLINICAL IMPRESSION: Pt. Able to progress L shoulder PROM at this time per protocol (see scanned document).  Good incision healing with minimal tenderness over L anterior sh.  Pt continues to be most limited with external rotation and abduction due to pain, she is able to get ~40 degrees passively. PT reviewed education on progression of MD protocol/ HEP.  Pt. Will continue to ice L shoulder after ex. And slowly wean out of sling.  Pt will benefit from continued PT services upon discharge to safely address deficits listed in patient problem list for decreased caregiver assistance and eventual return to PLOF.   OBJECTIVE IMPAIRMENTS: decreased ROM, decreased strength, hypomobility, impaired UE functional use, and pain.   ACTIVITY LIMITATIONS: carrying, bathing, dressing, reach over head, and  hygiene/grooming  PARTICIPATION LIMITATIONS: meal prep, cleaning, laundry, driving, community activity, occupation, and yard work  PERSONAL FACTORS: Age and 1 comorbidity: adhesive capsulitis  are also affecting patient's functional outcome.   REHAB POTENTIAL: Good  CLINICAL DECISION MAKING: Stable/uncomplicated  EVALUATION COMPLEXITY: Low   GOALS:   SHORT TERM GOALS: Target date: 11/16/2023  Pt will initiate PROM HEP after 10/27/2023 in order to improve L shoulder AROM while abiding by surgical protocols. Baseline: Initiate 2 weeks post-surgery (10/13/2023) Goal status: INITIAL  2.  Pt will begin to wean off sling use after 11/10/2023 in order to restore functional mobility in her L shoulder while abiding  by surgical protocols. Baseline: Initiate 4 weeks post-surgery (10/13/2023) Goal status: INITIAL  3.  Pt will demonstrate 90 degrees of L shoulder flexion PROM and 30 degrees of L shoulder ER PROM by 11/02/2023 in order to achieve surgeon-specific PROM goals. Baseline: N/A Goal status: INITIAL   LONG TERM GOALS: Target date: 12/13/2022  Pt will improve FOTO score to at least 63 in order to demonstrate improvement in pain and function with ADL's. Baseline: 41 Goal status: INITIAL  2.  Pt will achieve 120 degrees of L shoulder flexion PROM and 45 degrees of L shoulder ER PROM in order to achieve surgeon-specific PROM goals.  Baseline: N/A Goal status: INITIAL  3.  Pt will report being able to function without sling by week 6-8 (12/12 - 12/26) in order improve function and use of her L UE. Baseline: N/A Goal status: INITIAL  4.  Pt will improve L shoulder MMT scores to at least 4/5 to demonstrate improvement in L shoulder strength. Baseline: Not tested, to be assessed when appropriate going by protocol (post-op month 3 = January 31st) Goal status: INITIAL  5.  Pt will report <2/10 pain on NPS scale in order to show clinically significant improvement in pain at rest and  with mobility. Baseline: 7/10 pain at rest Goal status: INITIAL   PLAN:  PT FREQUENCY: 2x/week  PT DURATION: 8 weeks  PLANNED INTERVENTIONS: 97164- PT Re-evaluation, 97110-Therapeutic exercises, 97530- Therapeutic activity, 97112- Neuromuscular re-education, 97140- Manual therapy, Joint mobilization, Cryotherapy, and Moist heat  PLAN FOR NEXT SESSION: Shoulder PROM abiding by protocol, assess incision site.  Follow MD protocol.   Cammie Mcgee, PT, DPT # [phone] 9:12 AM,11/09/23

## 2023-11-14 ENCOUNTER — Ambulatory Visit: Payer: BC Managed Care – PPO | Admitting: Physical Therapy

## 2023-11-16 ENCOUNTER — Ambulatory Visit: Payer: BC Managed Care – PPO | Attending: Hand Surgery | Admitting: Physical Therapy

## 2023-11-16 ENCOUNTER — Encounter: Payer: Self-pay | Admitting: Physical Therapy

## 2023-11-16 DIAGNOSIS — G8929 Other chronic pain: Secondary | ICD-10-CM | POA: Insufficient documentation

## 2023-11-16 DIAGNOSIS — M25612 Stiffness of left shoulder, not elsewhere classified: Secondary | ICD-10-CM | POA: Insufficient documentation

## 2023-11-16 DIAGNOSIS — M25512 Pain in left shoulder: Secondary | ICD-10-CM | POA: Diagnosis present

## 2023-11-16 DIAGNOSIS — Z9889 Other specified postprocedural states: Secondary | ICD-10-CM | POA: Insufficient documentation

## 2023-11-16 NOTE — Therapy (Signed)
OUTPATIENT PHYSICAL THERAPY SHOULDER TREATMENT  Patient Name: Hannah Cross MRN: [id number] DOB:1955/12/31, 67 y.o., female Today's Date: 11/16/2023  END OF SESSION:  PT End of Session - 11/16/23 0743     Visit Number 9    Number of Visits 16    Date for PT Re-Evaluation 12/14/23    PT Start Time 0726    PT Stop Time 0816    PT Time Calculation (min) 50 min    Activity Tolerance Patient limited by pain    Behavior During Therapy [facility] for tasks assessed/performed            Past Surgical History:  Procedure Laterality Date   ABDOMINAL HYSTERECTOMY     OOPHORECTOMY     Patient Active Problem List   Diagnosis Date Noted   Syncope 08/06/2019    PCP: Titus Mould, NP  REFERRING PROVIDER: Fletcher Anon, MD  REFERRING DIAG: Incomplete rotator cuff tear or rupture of left shoulder, not specified as traumatic; adhesive capsulitis of left shoulder  THERAPY DIAG:  S/P arthroscopy of left shoulder  Chronic left shoulder pain  Stiffness of left shoulder, not elsewhere classified  S/P left rotator cuff repair  Rationale for Evaluation and Treatment: Rehabilitation  ONSET DATE: 10/13/2023    SUBJECTIVE:                                                                                                                                                                                      SUBJECTIVE STATEMENT: Pt reports to PT s/p L shoulder arthroscopy with rotator cuff repair, extensive debridement, tenodesis of long tendon of biceps, decompression of subacromial space with partial acromioplasty and distal claviculectomy (10/13/2023). Pt reports dealing with chronic bilateral shoulder pain for most of this year and received injections in both shoulders (L side in July); pt reported improvement in pain with her R shoulder but no improvement with her left, which is what resulted in her having the surgery. She says her pain is usually worse at night and she  is having trouble sleeping because of the pain. Pt reports all her sensation has returned since the numbing block wore off earlier this week.  Hand dominance: Right  PERTINENT HISTORY: Adhesive capsulitis, stress fx of L fibula (September 2024)  PAIN:  Are you having pain? Yes: NPRS scale: 7/10 now, 9/10 worst, 2/10 best Pain location: L shoulder Pain description: Sharp, burning Aggravating factors: Movement, having shoulder at rest without support Relieving factors: Ibuprofen  PRECAUTIONS: Shoulder  RED FLAGS: None   WEIGHT BEARING RESTRICTIONS: Yes NWB LUE for first 6 weeks after surgery  FALLS:  Has patient fallen in last 6 months? No  LIVING ENVIRONMENT: Lives with: lives with their spouse Lives in: House/apartment Stairs: Yes: External: 3 steps; none Has following equipment at home: None  OCCUPATION: Safety Specialist  PLOF: Independent  PATIENT GOALS:Get normal ROM back with no pain, get back to cooking for her family  NEXT MD VISIT: TBD  OBJECTIVE:  Note: Objective measures were completed at Evaluation unless otherwise noted.  DIAGNOSTIC FINDINGS:  N/A  PATIENT SURVEYS:  FOTO 41/63  COGNITION: Overall cognitive status: Within functional limits for tasks assessed     SENSATION: Not tested (pt reports sensation has returned since numbing block wore off)  POSTURE: No significant postural limitations noted  UPPER EXTREMITY ROM:   Passive/Active ROM Right Eval (Active)/In sitting Left eval  Shoulder flexion 166 90 (abiding by protocol)  Shoulder extension 75   Shoulder abduction 160   Shoulder adduction    Shoulder internal rotation [facility]   Shoulder external rotation WFL ~10 degrees (abiding by protocol)  Elbow flexion [facility] WFL  Elbow extension [facility] WFL  Wrist flexion    Wrist extension    Wrist ulnar deviation    Wrist radial deviation    Wrist pronation    Wrist supination    (Blank rows = not tested)  UPPER EXTREMITY MMT:  MMT  Right eval Left eval  Shoulder flexion 5   Shoulder extension 5   Shoulder abduction 5   Shoulder adduction    Shoulder internal rotation 5   Shoulder external rotation 4   Middle trapezius    Lower trapezius    Elbow flexion    Elbow extension    Wrist flexion    Wrist extension    Wrist ulnar deviation    Wrist radial deviation    Wrist pronation    Wrist supination    Grip strength (lbs) 36.9 lbs average   (Blank rows = not tested)     TODAY'S TREATMENT:                                                                                                                                         DATE: 11/16/2023  Subjective:  Pt. Will be 5 weeks post op tomorrow.  Pt. Reports no L shoulder when she initially wakes up.  Pt. Still sleeping in chair and planning to return to bed when she can sleep on L shoulder.  Pt. Reports 3/10 L shoulder pain currently while walking into PT clinic without use of sling.  Pt. Instructed to start weaning out of sling with community activity over next week.    There.ex.:  See updated HEP/ issued shoulder pulley  Standing wall ladder (L shoulder) 5x (marked with balloon sticker).    Standing scapular retraction 20x with mirror/ tactile feedback.    Walking in clinic/ hallway without use of sling and focus on recip. Gait pattern/ consistent arm swing.    Seated shoulder pulley ex.: flexion/ scaption/ abduction  20x.    Seated L shoulder PROM in Flex/Abd/ER within the limits of Port[place]. 10 reps with holds.  Pt. Allowed to passively elevate L shoulder to 120 deg./ ER in scapular plane to 45 deg.     STM to L UT/shoulder/scapular musculature 5 min.  No tenderness.  Pt. Will ice L shoulder at home as needed.     PATIENT EDUCATION: Education details: Role of PT, PT plan of care, icing Person educated: Patient Education method: Explanation and Demonstration Education comprehension: verbalized understanding  HOME EXERCISE PROGRAM: Access Code:  [email or web address] URL: [email or web address] Date: 10/26/2023 Prepared by: Dorene Grebe Exercises - Seated Scapular Retraction - 2 x daily - 7 x weekly - 1 sets - 10 reps - Seated Cervical Rotation AROM - 2 x daily - 7 x weekly - 1 sets - 10 reps - Seated Cervical Sidebending AROM - 2 x daily - 7 x weekly - 1 sets - 10 reps - Standing Circular Shoulder Pendulum Supported with Arm Bent - 2 x daily - 7 x weekly - 1 sets - 10 reps - Seated Elbow Flexion AAROM - 2 x daily - 7 x weekly - 1 sets - 10 reps - Seated AAROM Elbow Supination/Pronation with Clasped Hands - 2 x daily - 7 x weekly - 1 sets - 10 reps  Access Code: [email or web address] URL: [email or web address] Date: 10/28/2023 Prepared by: Dorene Grebe  Exercises - Seated Scapular Retraction  - 2 x daily - 7 x weekly - 1 sets - 10 reps - Seated Cervical Rotation AROM  - 2 x daily - 7 x weekly - 1 sets - 10 reps - Seated Cervical Sidebending AROM  - 2 x daily - 7 x weekly - 1 sets - 10 reps - Standing Circular Shoulder Pendulum Supported with Arm Bent  - 2 x daily - 7 x weekly - 1 sets - 10 reps - Seated Elbow Flexion AAROM  - 2 x daily - 7 x weekly - 1 sets - 10 reps - Seated Shoulder Flexion Self PROM  - 3 x daily - 7 x weekly - 1 sets - 10 reps - 5-10 second hold - Supine Shoulder External Rotation with Dowel  - 3 x daily - 7 x weekly - 1 sets - 10 reps - 5-10 seconds hold  Access Code: [email or web address] URL: [email or web address] Date: 11/16/2023 Prepared by: Dorene Grebe  Exercises - Seated Cervical Sidebending AROM  - 2 x daily - 7 x weekly - 1 sets - 10 reps - Circular Shoulder Pendulum with Table Support  - 2 x daily - 7 x weekly - 1 sets - 10 reps - Seated Shoulder Flexion AAROM with Pulley Behind  - 2 x daily - 7 x weekly - 1 sets - 20 reps - Seated Shoulder Scaption AAROM with Pulley at Side  - 2 x daily - 7 x weekly - 1 sets - 20 reps - Seated Shoulder Abduction AAROM with Pulley Behind  - 2 x daily - 7 x weekly  - 1 sets - 20 reps - Supine Shoulder External Rotation in 45 Degrees Abduction AAROM with Dowel  - 2 x daily - 7 x weekly - 1 sets - 10 reps   ASSESSMENT:  CLINICAL IMPRESSION: Pt. Able to progress L shoulder PROM at this time per protocol (see scanned document).  No tenderness over L sh./ UT musculature.  Pt continues to be most limited with external rotation and abduction due to discomfort, she is able to get ~40 degrees passively.  PT reviewed education on progression of MD protocol/ new HEP.  Pt. Will continue to ice L shoulder after ex. And slowly wean out of sling.  Pt will benefit from continued PT services upon discharge to safely address deficits listed in patient problem list for decreased caregiver assistance and eventual return to PLOF.   OBJECTIVE IMPAIRMENTS: decreased ROM, decreased strength, hypomobility, impaired UE functional use, and pain.   ACTIVITY LIMITATIONS: carrying, bathing, dressing, reach over head, and hygiene/grooming  PARTICIPATION LIMITATIONS: meal prep, cleaning, laundry, driving, community activity, occupation, and yard work  PERSONAL FACTORS: Age and 1 comorbidity: adhesive capsulitis  are also affecting patient's functional outcome.   REHAB POTENTIAL: Good  CLINICAL DECISION MAKING: Stable/uncomplicated  EVALUATION COMPLEXITY: Low   GOALS:   SHORT TERM GOALS: Target date: 11/16/2023  Pt will initiate PROM HEP after 10/27/2023 in order to improve L shoulder AROM while abiding by surgical protocols. Baseline: Initiate 2 weeks post-surgery (10/13/2023) Goal status: Goal met  2.  Pt will begin to wean off sling use after 11/10/2023 in order to restore functional mobility in her L shoulder while abiding by surgical protocols. Baseline: Initiate 4 weeks post-surgery (10/13/2023) Goal status: Goal met  3.  Pt will demonstrate 90 degrees of L shoulder flexion PROM and 30 degrees of L shoulder ER PROM by 11/02/2023 in order to achieve surgeon-specific  PROM goals. Baseline: N/A Goal status: Goal met   LONG TERM GOALS: Target date: 12/13/2022  Pt will improve FOTO score to at least 63 in order to demonstrate improvement in pain and function with ADL's. Baseline: 41 Goal status: INITIAL  2.  Pt will achieve 120 degrees of L shoulder flexion PROM and 45 degrees of L shoulder ER PROM in order to achieve surgeon-specific PROM goals.  Baseline: N/A Goal status: INITIAL  3.  Pt will report being able to function without sling by week 6-8 (12/12 - 12/26) in order improve function and use of her L UE. Baseline: N/A Goal status: INITIAL  4.  Pt will improve L shoulder MMT scores to at least 4/5 to demonstrate improvement in L shoulder strength. Baseline: Not tested, to be assessed when appropriate going by protocol (post-op month 3 = January 31st) Goal status: INITIAL  5.  Pt will report <2/10 pain on NPS scale in order to show clinically significant improvement in pain at rest and with mobility. Baseline: 7/10 pain at rest Goal status: INITIAL   PLAN:  PT FREQUENCY: 2x/week  PT DURATION: 8 weeks  PLANNED INTERVENTIONS: 97164- PT Re-evaluation, 97110-Therapeutic exercises, 97530- Therapeutic activity, 97112- Neuromuscular re-education, 97140- Manual therapy, Joint mobilization, Cryotherapy, and Moist heat  PLAN FOR NEXT SESSION: Follow MD protocol/ send MD progress note next week  Cammie Mcgee, PT, DPT # [phone] 8:42 AM,11/16/23

## 2023-11-21 ENCOUNTER — Ambulatory Visit: Payer: BC Managed Care – PPO | Admitting: Physical Therapy

## 2023-11-21 ENCOUNTER — Encounter: Payer: Self-pay | Admitting: Physical Therapy

## 2023-11-21 DIAGNOSIS — M25612 Stiffness of left shoulder, not elsewhere classified: Secondary | ICD-10-CM

## 2023-11-21 DIAGNOSIS — Z9889 Other specified postprocedural states: Secondary | ICD-10-CM | POA: Diagnosis not present

## 2023-11-21 DIAGNOSIS — G8929 Other chronic pain: Secondary | ICD-10-CM

## 2023-11-21 NOTE — Therapy (Signed)
OUTPATIENT PHYSICAL THERAPY SHOULDER TREATMENT Physical Therapy Progress Note  Dates of reporting period  10/19/23   to   11/21/23   Patient Name: Hannah Cross MRN: [id number] DOB:07-Sep-1956, 67 y.o., female Today's Date: 11/21/2023  END OF SESSION:  PT End of Session - 11/21/23 0734     Visit Number 10    Number of Visits 16    Date for PT Re-Evaluation 12/14/23    PT Start Time 0730    PT Stop Time 0817    PT Time Calculation (min) 47 min    Activity Tolerance Patient limited by pain    Behavior During Therapy [facility] for tasks assessed/performed            Past Surgical History:  Procedure Laterality Date   ABDOMINAL HYSTERECTOMY     OOPHORECTOMY     Patient Active Problem List   Diagnosis Date Noted   Syncope 08/06/2019    PCP: Titus Mould, NP  REFERRING PROVIDER: Fletcher Anon, MD  REFERRING DIAG: Incomplete rotator cuff tear or rupture of left shoulder, not specified as traumatic; adhesive capsulitis of left shoulder  THERAPY DIAG:  S/P arthroscopy of left shoulder  Chronic left shoulder pain  Stiffness of left shoulder, not elsewhere classified  S/P left rotator cuff repair  Rationale for Evaluation and Treatment: Rehabilitation  ONSET DATE: 10/13/2023    SUBJECTIVE:                                                                                                                                                                                      SUBJECTIVE STATEMENT: Pt reports to PT s/p L shoulder arthroscopy with rotator cuff repair, extensive debridement, tenodesis of long tendon of biceps, decompression of subacromial space with partial acromioplasty and distal claviculectomy (10/13/2023). Pt reports dealing with chronic bilateral shoulder pain for most of this year and received injections in both shoulders (L side in July); pt reported improvement in pain with her R shoulder but no improvement with her left, which is what  resulted in her having the surgery. She says her pain is usually worse at night and she is having trouble sleeping because of the pain. Pt reports all her sensation has returned since the numbing block wore off earlier this week.  Hand dominance: Right  PERTINENT HISTORY: Adhesive capsulitis, stress fx of L fibula (September 2024)  PAIN:  Are you having pain? Yes: NPRS scale: 7/10 now, 9/10 worst, 2/10 best Pain location: L shoulder Pain description: Sharp, burning Aggravating factors: Movement, having shoulder at rest without support Relieving factors: Ibuprofen  PRECAUTIONS: Shoulder  RED FLAGS: None   WEIGHT BEARING RESTRICTIONS: Yes NWB  LUE for first 6 weeks after surgery  FALLS:  Has patient fallen in last 6 months? No  LIVING ENVIRONMENT: Lives with: lives with their spouse Lives in: House/apartment Stairs: Yes: External: 3 steps; none Has following equipment at home: None  OCCUPATION: Safety Specialist  PLOF: Independent  PATIENT GOALS:Get normal ROM back with no pain, get back to cooking for her family  NEXT MD VISIT: TBD  OBJECTIVE:  Note: Objective measures were completed at Evaluation unless otherwise noted.  DIAGNOSTIC FINDINGS:  N/A  PATIENT SURVEYS:  FOTO 41/63  COGNITION: Overall cognitive status: Within functional limits for tasks assessed     SENSATION: Not tested (pt reports sensation has returned since numbing block wore off)  POSTURE: No significant postural limitations noted  UPPER EXTREMITY ROM:   Passive/Active ROM Right Eval (Active)/In sitting Left eval  Shoulder flexion 166 90 (abiding by protocol)  Shoulder extension 75   Shoulder abduction 160   Shoulder adduction    Shoulder internal rotation [facility]   Shoulder external rotation WFL ~10 degrees (abiding by protocol)  Elbow flexion [facility] WFL  Elbow extension [facility] WFL  Wrist flexion    Wrist extension    Wrist ulnar deviation    Wrist radial deviation    Wrist pronation     Wrist supination    (Blank rows = not tested)  UPPER EXTREMITY MMT:  MMT Right eval Left eval  Shoulder flexion 5   Shoulder extension 5   Shoulder abduction 5   Shoulder adduction    Shoulder internal rotation 5   Shoulder external rotation 4   Middle trapezius    Lower trapezius    Elbow flexion    Elbow extension    Wrist flexion    Wrist extension    Wrist ulnar deviation    Wrist radial deviation    Wrist pronation    Wrist supination    Grip strength (lbs) 36.9 lbs average   (Blank rows = not tested)     TODAY'S TREATMENT:                                                                                                                                         DATE: 11/21/2023  Subjective:  Pt. Will be 6 weeks post op this week.  Pt. Reports 5/10 L shoulder pain currently while walking into PT clinic without use of sling. Pt. Returns to MD tomorrow for 6 week f/u.  Pt. Has remained compliant with MD protocol/ HEP.  Increase c/o L shoulder generalized soreness this past weekend.    There.ex.:  Seated shoulder pulley (flexion/ abduction)- cuing to relax UT.  Reviewed/discussed use of pulley for HEP to avoid pain and focus on AA/PROM.    Seated/standing scapular retraction 20x with tactile feedback.    Walking in clinic/ hallway without use of sling and focus on recip. Gait pattern/ consistent arm swing.  Supine L shoulder PROM in Flex/Abd/ER within the limits of [place]. 10 reps with holds.  Pt. Allowed to passively elevate L shoulder to 120 deg./ ER in scapular plane to 45 deg. Pain limited.       STM to L UT/shoulder/scapular musculature 5 min.  Generalized soreness in L shoulder.    Reassessment of goals/ progress note.    Ice to L shoulder in sitting after tx. Session.     PATIENT EDUCATION: Education details: Role of PT, PT plan of care, icing Person educated: Patient Education method: Explanation and Demonstration Education comprehension: verbalized  understanding  HOME EXERCISE PROGRAM: Access Code: [email or web address] URL: [email or web address] Date: 10/26/2023 Prepared by: Dorene Grebe Exercises - Seated Scapular Retraction - 2 x daily - 7 x weekly - 1 sets - 10 reps - Seated Cervical Rotation AROM - 2 x daily - 7 x weekly - 1 sets - 10 reps - Seated Cervical Sidebending AROM - 2 x daily - 7 x weekly - 1 sets - 10 reps - Standing Circular Shoulder Pendulum Supported with Arm Bent - 2 x daily - 7 x weekly - 1 sets - 10 reps - Seated Elbow Flexion AAROM - 2 x daily - 7 x weekly - 1 sets - 10 reps - Seated AAROM Elbow Supination/Pronation with Clasped Hands - 2 x daily - 7 x weekly - 1 sets - 10 reps  Access Code: [email or web address] URL: [email or web address] Date: 10/28/2023 Prepared by: Dorene Grebe  Exercises - Seated Scapular Retraction  - 2 x daily - 7 x weekly - 1 sets - 10 reps - Seated Cervical Rotation AROM  - 2 x daily - 7 x weekly - 1 sets - 10 reps - Seated Cervical Sidebending AROM  - 2 x daily - 7 x weekly - 1 sets - 10 reps - Standing Circular Shoulder Pendulum Supported with Arm Bent  - 2 x daily - 7 x weekly - 1 sets - 10 reps - Seated Elbow Flexion AAROM  - 2 x daily - 7 x weekly - 1 sets - 10 reps - Seated Shoulder Flexion Self PROM  - 3 x daily - 7 x weekly - 1 sets - 10 reps - 5-10 second hold - Supine Shoulder External Rotation with Dowel  - 3 x daily - 7 x weekly - 1 sets - 10 reps - 5-10 seconds hold  Access Code: [email or web address] URL: [email or web address] Date: 11/16/2023 Prepared by: Dorene Grebe  Exercises - Seated Cervical Sidebending AROM  - 2 x daily - 7 x weekly - 1 sets - 10 reps - Circular Shoulder Pendulum with Table Support  - 2 x daily - 7 x weekly - 1 sets - 10 reps - Seated Shoulder Flexion AAROM with Pulley Behind  - 2 x daily - 7 x weekly - 1 sets - 20 reps - Seated Shoulder Scaption AAROM with Pulley at Side  - 2 x daily - 7 x weekly - 1 sets - 20 reps - Seated Shoulder  Abduction AAROM with Pulley Behind  - 2 x daily - 7 x weekly - 1 sets - 20 reps - Supine Shoulder External Rotation in 45 Degrees Abduction AAROM with Dowel  - 2 x daily - 7 x weekly - 1 sets - 10 reps   ASSESSMENT:  CLINICAL IMPRESSION: Pt. Able to progress L shoulder PROM at this time per protocol (see scanned document).  Generalized tenderness over L sh./ UT musculature today.  Pt continues to be most limited with external  rotation and abduction due to discomfort, she is able to get ~45 degrees passively. PT reviewed education on progression of MD protocol/ HEP.  Pt. Will continue to ice L shoulder after ex. And slowly wean out of sling.  Pt. Is not using sling in the home and only in the community.   Pt will benefit from continued PT services upon discharge to safely address deficits listed in patient problem list for decreased caregiver assistance and eventual return to PLOF.   OBJECTIVE IMPAIRMENTS: decreased ROM, decreased strength, hypomobility, impaired UE functional use, and pain.   ACTIVITY LIMITATIONS: carrying, bathing, dressing, reach over head, and hygiene/grooming  PARTICIPATION LIMITATIONS: meal prep, cleaning, laundry, driving, community activity, occupation, and yard work  PERSONAL FACTORS: Age and 1 comorbidity: adhesive capsulitis  are also affecting patient's functional outcome.   REHAB POTENTIAL: Good  CLINICAL DECISION MAKING: Stable/uncomplicated  EVALUATION COMPLEXITY: Low   GOALS:   SHORT TERM GOALS: Target date: 11/16/2023  Pt will initiate PROM HEP after 10/27/2023 in order to improve L shoulder AROM while abiding by surgical protocols. Baseline: Initiate 2 weeks post-surgery (10/13/2023) Goal status: Goal met  2.  Pt will begin to wean off sling use after 11/10/2023 in order to restore functional mobility in her L shoulder while abiding by surgical protocols. Baseline: Initiate 4 weeks post-surgery (10/13/2023) Goal status: Goal met  3.  Pt will  demonstrate 90 degrees of L shoulder flexion PROM and 30 degrees of L shoulder ER PROM by 11/02/2023 in order to achieve surgeon-specific PROM goals. Baseline: N/A Goal status: Goal met   LONG TERM GOALS: Target date: 12/13/2022  Pt will improve FOTO score to at least 63 in order to demonstrate improvement in pain and function with ADL's. Baseline: 41.  12/9: 42 Goal status: Not met  2.  Pt will achieve 120 degrees of L shoulder flexion PROM and 45 degrees of L shoulder ER PROM in order to achieve surgeon-specific PROM goals.  Baseline: 12/9: limited per protocol Goal status: Goal met  3.  Pt will report being able to function without sling by week 6-8 (12/12 - 12/26) in order improve function and use of her L UE. Baseline: N/A Goal status: Partially met  4.  Pt will improve L shoulder MMT scores to at least 4/5 to demonstrate improvement in L shoulder strength. Baseline: Not tested, to be assessed when appropriate going by protocol (post-op month 3 = January 31st) Goal status: Not met  5.  Pt will report <2/10 pain on NPS scale in order to show clinically significant improvement in pain at rest and with mobility. Baseline: 7/10 pain at rest.  12/9: 3-5/10 (slight increase in L shoulder pain this past weekend).   Goal status: Not met   PLAN:  PT FREQUENCY: 2x/week  PT DURATION: 8 weeks  PLANNED INTERVENTIONS: 97164- PT Re-evaluation, 97110-Therapeutic exercises, 97530- Therapeutic activity, [id number]- Neuromuscular re-education, 97140- Manual therapy, Joint mobilization, Cryotherapy, and Moist heat  PLAN FOR NEXT SESSION: Follow MD protocol/ discuss MD visit.  Progress HEP  Cammie Mcgee, PT, DPT # [phone] 8:29 AM,11/21/23

## 2023-11-22 NOTE — Therapy (Signed)
OUTPATIENT PHYSICAL THERAPY SHOULDER TREATMENT   Patient Name: Hannah Cross MRN: [id number] DOB:02/11/1956, 67 y.o., female Today's Date: 11/23/2023  END OF SESSION:  PT End of Session - 11/23/23 0728     Visit Number 11    Number of Visits 16    Date for PT Re-Evaluation 12/14/23    PT Start Time 0730    PT Stop Time 0812    PT Time Calculation (min) 42 min    Activity Tolerance Patient limited by pain    Behavior During Therapy [facility] for tasks assessed/performed             Past Surgical History:  Procedure Laterality Date   ABDOMINAL HYSTERECTOMY     OOPHORECTOMY     Patient Active Problem List   Diagnosis Date Noted   Syncope 08/06/2019    PCP: Titus Mould, NP  REFERRING PROVIDER: Fletcher Anon, MD  REFERRING DIAG: Incomplete rotator cuff tear or rupture of left shoulder, not specified as traumatic; adhesive capsulitis of left shoulder  THERAPY DIAG:  S/P arthroscopy of left shoulder  Chronic left shoulder pain  Stiffness of left shoulder, not elsewhere classified  Rationale for Evaluation and Treatment: Rehabilitation  ONSET DATE: 10/13/2023    SUBJECTIVE:                                                                                                                                                                                      SUBJECTIVE STATEMENT: Pt reports to PT s/p L shoulder arthroscopy with rotator cuff repair, extensive debridement, tenodesis of long tendon of biceps, decompression of subacromial space with partial acromioplasty and distal claviculectomy (10/13/2023). Pt reports dealing with chronic bilateral shoulder pain for most of this year and received injections in both shoulders (L side in July); pt reported improvement in pain with her R shoulder but no improvement with her left, which is what resulted in her having the surgery. She says her pain is usually worse at night and she is having trouble sleeping  because of the pain. Pt reports all her sensation has returned since the numbing block wore off earlier this week.  Hand dominance: Right  PERTINENT HISTORY: Adhesive capsulitis, stress fx of L fibula (September 2024)  PAIN:  Are you having pain? Yes: NPRS scale: 7/10 now, 9/10 worst, 2/10 best Pain location: L shoulder Pain description: Sharp, burning Aggravating factors: Movement, having shoulder at rest without support Relieving factors: Ibuprofen  PRECAUTIONS: Shoulder  RED FLAGS: None   WEIGHT BEARING RESTRICTIONS: Yes NWB LUE for first 6 weeks after surgery  FALLS:  Has patient fallen in last 6 months? No  LIVING ENVIRONMENT: Lives  with: lives with their spouse Lives in: House/apartment Stairs: Yes: External: 3 steps; none Has following equipment at home: None  OCCUPATION: Safety Specialist  PLOF: Independent  PATIENT GOALS:Get normal ROM back with no pain, get back to cooking for her family  NEXT MD VISIT: TBD  OBJECTIVE:  Note: Objective measures were completed at Evaluation unless otherwise noted.  DIAGNOSTIC FINDINGS:  N/A  PATIENT SURVEYS:  FOTO 41/63  COGNITION: Overall cognitive status: Within functional limits for tasks assessed     SENSATION: Not tested (pt reports sensation has returned since numbing block wore off)  POSTURE: No significant postural limitations noted  UPPER EXTREMITY ROM:   Passive/Active ROM Right Eval (Active)/In sitting Left eval  Shoulder flexion 166 90 (abiding by protocol)  Shoulder extension 75   Shoulder abduction 160   Shoulder adduction    Shoulder internal rotation [facility]   Shoulder external rotation WFL ~10 degrees (abiding by protocol)  Elbow flexion [facility] WFL  Elbow extension [facility] WFL  Wrist flexion    Wrist extension    Wrist ulnar deviation    Wrist radial deviation    Wrist pronation    Wrist supination    (Blank rows = not tested)  UPPER EXTREMITY MMT:  MMT Right eval Left eval  Shoulder  flexion 5   Shoulder extension 5   Shoulder abduction 5   Shoulder adduction    Shoulder internal rotation 5   Shoulder external rotation 4   Middle trapezius    Lower trapezius    Elbow flexion    Elbow extension    Wrist flexion    Wrist extension    Wrist ulnar deviation    Wrist radial deviation    Wrist pronation    Wrist supination    Grip strength (lbs) 36.9 lbs average   (Blank rows = not tested)     TODAY'S TREATMENT:                                                                                                                                         DATE: 11/23/2023    Subjective:  Patient states she got her dates mixed up for her MD follow up and it is this date (12/11).     There.ex.:  Seated shoulder pulley (flexion/ abduction)- cuing to relax UT.  Reviewed/discussed use of pulley for HEP to avoid pain and focus on AA/PROM.    Seated/standing scapular retraction 20x with tactile feedback.     Walking in clinic/ hallway without use of sling and focus on recip. Gait pattern/ consistent arm swing.    Supine L shoulder PROM in Flex/Abd/ER within the limits of [place]. 10 reps with holds.  Pt. Allowed to passively elevate L shoulder to 120 deg./ ER in scapular plane to 45 deg. Pain limited.       STM to L UT/shoulder/scapular musculature 5 min.  Generalized soreness in  L shoulder.    Supine L shoulder AAROM with dowel in flexion/abduction within limits of protocol   Standing L shoulder flexion on wall ladder x 10, abduction x 5   Patient to ice at home    PATIENT EDUCATION: Education details: Role of PT, PT plan of care, icing Person educated: Patient Education method: Explanation and Demonstration Education comprehension: verbalized understanding  HOME EXERCISE PROGRAM: Access Code: [email or web address] URL: [email or web address] Date: 10/26/2023 Prepared by: Dorene Grebe Exercises - Seated Scapular Retraction - 2 x daily - 7 x weekly - 1 sets - 10  reps - Seated Cervical Rotation AROM - 2 x daily - 7 x weekly - 1 sets - 10 reps - Seated Cervical Sidebending AROM - 2 x daily - 7 x weekly - 1 sets - 10 reps - Standing Circular Shoulder Pendulum Supported with Arm Bent - 2 x daily - 7 x weekly - 1 sets - 10 reps - Seated Elbow Flexion AAROM - 2 x daily - 7 x weekly - 1 sets - 10 reps - Seated AAROM Elbow Supination/Pronation with Clasped Hands - 2 x daily - 7 x weekly - 1 sets - 10 reps  Access Code: [email or web address] URL: [email or web address] Date: 10/28/2023 Prepared by: Dorene Grebe  Exercises - Seated Scapular Retraction  - 2 x daily - 7 x weekly - 1 sets - 10 reps - Seated Cervical Rotation AROM  - 2 x daily - 7 x weekly - 1 sets - 10 reps - Seated Cervical Sidebending AROM  - 2 x daily - 7 x weekly - 1 sets - 10 reps - Standing Circular Shoulder Pendulum Supported with Arm Bent  - 2 x daily - 7 x weekly - 1 sets - 10 reps - Seated Elbow Flexion AAROM  - 2 x daily - 7 x weekly - 1 sets - 10 reps - Seated Shoulder Flexion Self PROM  - 3 x daily - 7 x weekly - 1 sets - 10 reps - 5-10 second hold - Supine Shoulder External Rotation with Dowel  - 3 x daily - 7 x weekly - 1 sets - 10 reps - 5-10 seconds hold  Access Code: [email or web address] URL: [email or web address] Date: 11/16/2023 Prepared by: Dorene Grebe  Exercises - Seated Cervical Sidebending AROM  - 2 x daily - 7 x weekly - 1 sets - 10 reps - Circular Shoulder Pendulum with Table Support  - 2 x daily - 7 x weekly - 1 sets - 10 reps - Seated Shoulder Flexion AAROM with Pulley Behind  - 2 x daily - 7 x weekly - 1 sets - 20 reps - Seated Shoulder Scaption AAROM with Pulley at Side  - 2 x daily - 7 x weekly - 1 sets - 20 reps - Seated Shoulder Abduction AAROM with Pulley Behind  - 2 x daily - 7 x weekly - 1 sets - 20 reps - Supine Shoulder External Rotation in 45 Degrees Abduction AAROM with Dowel  - 2 x daily - 7 x weekly - 1 sets - 10 reps   ASSESSMENT:  CLINICAL  IMPRESSION:    Patient arrives to treatment session without sling and reports she is spending more time out of the sling except with grandchildren and in public. Session focused on L shoulder PROM within protocol and introduction of AAROM. Patient with general L shoulder soreness with STM and movement. Pt will benefit from continued PT services upon discharge to safely address deficits listed in patient problem list for decreased caregiver  assistance and eventual return to PLOF.    OBJECTIVE IMPAIRMENTS: decreased ROM, decreased strength, hypomobility, impaired UE functional use, and pain.   ACTIVITY LIMITATIONS: carrying, bathing, dressing, reach over head, and hygiene/grooming  PARTICIPATION LIMITATIONS: meal prep, cleaning, laundry, driving, community activity, occupation, and yard work  PERSONAL FACTORS: Age and 1 comorbidity: adhesive capsulitis  are also affecting patient's functional outcome.   REHAB POTENTIAL: Good  CLINICAL DECISION MAKING: Stable/uncomplicated  EVALUATION COMPLEXITY: Low   GOALS:   SHORT TERM GOALS: Target date: 11/16/2023  Pt will initiate PROM HEP after 10/27/2023 in order to improve L shoulder AROM while abiding by surgical protocols. Baseline: Initiate 2 weeks post-surgery (10/13/2023) Goal status: Goal met  2.  Pt will begin to wean off sling use after 11/10/2023 in order to restore functional mobility in her L shoulder while abiding by surgical protocols. Baseline: Initiate 4 weeks post-surgery (10/13/2023) Goal status: Goal met  3.  Pt will demonstrate 90 degrees of L shoulder flexion PROM and 30 degrees of L shoulder ER PROM by 11/02/2023 in order to achieve surgeon-specific PROM goals. Baseline: N/A Goal status: Goal met   LONG TERM GOALS: Target date: 12/13/2022  Pt will improve FOTO score to at least 63 in order to demonstrate improvement in pain and function with ADL's. Baseline: 41.  12/9: 42 Goal status: Not met  2.  Pt will achieve  120 degrees of L shoulder flexion PROM and 45 degrees of L shoulder ER PROM in order to achieve surgeon-specific PROM goals.  Baseline: 12/9: limited per protocol Goal status: Goal met  3.  Pt will report being able to function without sling by week 6-8 (12/12 - 12/26) in order improve function and use of her L UE. Baseline: N/A Goal status: Partially met  4.  Pt will improve L shoulder MMT scores to at least 4/5 to demonstrate improvement in L shoulder strength. Baseline: Not tested, to be assessed when appropriate going by protocol (post-op month 3 = January 31st) Goal status: Not met  5.  Pt will report <2/10 pain on NPS scale in order to show clinically significant improvement in pain at rest and with mobility. Baseline: 7/10 pain at rest.  12/9: 3-5/10 (slight increase in L shoulder pain this past weekend).   Goal status: Not met   PLAN:  PT FREQUENCY: 2x/week  PT DURATION: 8 weeks  PLANNED INTERVENTIONS: 97164- PT Re-evaluation, 97110-Therapeutic exercises, 97530- Therapeutic activity, [id number]- Neuromuscular re-education, 97140- Manual therapy, Joint mobilization, Cryotherapy, and Moist heat  PLAN FOR NEXT SESSION: Follow MD protocol/ discuss MD visit.  Progress HEP  Maylon Peppers, PT, DPT Physical Therapist - [place]  [facility]  7:29 AM,11/23/23

## 2023-11-23 ENCOUNTER — Encounter: Payer: Self-pay | Admitting: Physical Therapy

## 2023-11-23 ENCOUNTER — Ambulatory Visit: Payer: BC Managed Care – PPO

## 2023-11-23 DIAGNOSIS — Z9889 Other specified postprocedural states: Secondary | ICD-10-CM | POA: Diagnosis not present

## 2023-11-23 DIAGNOSIS — M25612 Stiffness of left shoulder, not elsewhere classified: Secondary | ICD-10-CM

## 2023-11-23 DIAGNOSIS — G8929 Other chronic pain: Secondary | ICD-10-CM

## 2023-11-28 ENCOUNTER — Ambulatory Visit: Payer: BC Managed Care – PPO | Admitting: Physical Therapy

## 2023-11-28 NOTE — Therapy (Signed)
OUTPATIENT PHYSICAL THERAPY SHOULDER TREATMENT   Patient Name: Hannah Cross MRN: [id number] DOB:Jul 12, 1956, 67 y.o., female Today's Date: 11/28/2023  END OF SESSION:  PT End of Session - 11/28/23 0703     Visit Number 11    Number of Visits 16    Date for PT Re-Evaluation 12/14/23    Activity Tolerance Patient limited by pain    Behavior During Therapy [facility] for tasks assessed/performed             Past Surgical History:  Procedure Laterality Date   ABDOMINAL HYSTERECTOMY     OOPHORECTOMY     Patient Active Problem List   Diagnosis Date Noted   Syncope 08/06/2019    PCP: Titus Mould, NP  REFERRING PROVIDER: Fletcher Anon, MD  REFERRING DIAG: Incomplete rotator cuff tear or rupture of left shoulder, not specified as traumatic; adhesive capsulitis of left shoulder  THERAPY DIAG:  S/P arthroscopy of left shoulder  Chronic left shoulder pain  Stiffness of left shoulder, not elsewhere classified  S/P left rotator cuff repair  Rationale for Evaluation and Treatment: Rehabilitation  ONSET DATE: 10/13/2023    SUBJECTIVE:                                                                                                                                                                                      SUBJECTIVE STATEMENT: Pt reports to PT s/p L shoulder arthroscopy with rotator cuff repair, extensive debridement, tenodesis of long tendon of biceps, decompression of subacromial space with partial acromioplasty and distal claviculectomy (10/13/2023). Pt reports dealing with chronic bilateral shoulder pain for most of this year and received injections in both shoulders (L side in July); pt reported improvement in pain with her R shoulder but no improvement with her left, which is what resulted in her having the surgery. She says her pain is usually worse at night and she is having trouble sleeping because of the pain. Pt reports all her sensation  has returned since the numbing block wore off earlier this week.  Hand dominance: Right  PERTINENT HISTORY: Adhesive capsulitis, stress fx of L fibula (September 2024)  PAIN:  Are you having pain? Yes: NPRS scale: 7/10 now, 9/10 worst, 2/10 best Pain location: L shoulder Pain description: Sharp, burning Aggravating factors: Movement, having shoulder at rest without support Relieving factors: Ibuprofen  PRECAUTIONS: Shoulder  RED FLAGS: None   WEIGHT BEARING RESTRICTIONS: Yes NWB LUE for first 6 weeks after surgery  FALLS:  Has patient fallen in last 6 months? No  LIVING ENVIRONMENT: Lives with: lives with their spouse Lives in: House/apartment Stairs: Yes: External: 3 steps; none Has following equipment  at home: None  OCCUPATION: Safety Specialist  PLOF: Independent  PATIENT GOALS:Get normal ROM back with no pain, get back to cooking for her family  NEXT MD VISIT: TBD  OBJECTIVE:  Note: Objective measures were completed at Evaluation unless otherwise noted.  DIAGNOSTIC FINDINGS:  N/A  PATIENT SURVEYS:  FOTO 41/63  COGNITION: Overall cognitive status: Within functional limits for tasks assessed     SENSATION: Not tested (pt reports sensation has returned since numbing block wore off)  POSTURE: No significant postural limitations noted  UPPER EXTREMITY ROM:   Passive/Active ROM Right Eval (Active)/In sitting Left eval  Shoulder flexion 166 90 (abiding by protocol)  Shoulder extension 75   Shoulder abduction 160   Shoulder adduction    Shoulder internal rotation [facility]   Shoulder external rotation WFL ~10 degrees (abiding by protocol)  Elbow flexion [facility] WFL  Elbow extension [facility] WFL  Wrist flexion    Wrist extension    Wrist ulnar deviation    Wrist radial deviation    Wrist pronation    Wrist supination    (Blank rows = not tested)  UPPER EXTREMITY MMT:  MMT Right eval Left eval  Shoulder flexion 5   Shoulder extension 5   Shoulder  abduction 5   Shoulder adduction    Shoulder internal rotation 5   Shoulder external rotation 4   Middle trapezius    Lower trapezius    Elbow flexion    Elbow extension    Wrist flexion    Wrist extension    Wrist ulnar deviation    Wrist radial deviation    Wrist pronation    Wrist supination    Grip strength (lbs) 36.9 lbs average   (Blank rows = not tested)     TODAY'S TREATMENT:                                                                                                                                         DATE: 11/28/2023    Subjective:  Pt. Had good f/u with MD (virtual visit).  Pt. Called to cancel secondary to overslept.  Pt. Rescheduled for tomorrow.     There.ex.:  Emailed HEP with added shoulder ER in sidelying/ standing shoulder flexion at wall.     PATIENT EDUCATION: Education details: Role of PT, PT plan of care, icing Person educated: Patient Education method: Explanation and Demonstration Education comprehension: verbalized understanding  HOME EXERCISE PROGRAM: Access Code: [email or web address] URL: [email or web address] Date: 10/26/2023 Prepared by: Dorene Grebe Exercises - Seated Scapular Retraction - 2 x daily - 7 x weekly - 1 sets - 10 reps - Seated Cervical Rotation AROM - 2 x daily - 7 x weekly - 1 sets - 10 reps - Seated Cervical Sidebending AROM - 2 x daily - 7 x weekly - 1 sets - 10 reps - Standing Circular Shoulder Pendulum Supported with Arm Bent - 2  x daily - 7 x weekly - 1 sets - 10 reps - Seated Elbow Flexion AAROM - 2 x daily - 7 x weekly - 1 sets - 10 reps - Seated AAROM Elbow Supination/Pronation with Clasped Hands - 2 x daily - 7 x weekly - 1 sets - 10 reps  Access Code: [email or web address] URL: [email or web address] Date: 10/28/2023 Prepared by: Dorene Grebe  Exercises - Seated Scapular Retraction  - 2 x daily - 7 x weekly - 1 sets - 10 reps - Seated Cervical Rotation AROM  - 2 x daily - 7 x weekly - 1 sets - 10 reps -  Seated Cervical Sidebending AROM  - 2 x daily - 7 x weekly - 1 sets - 10 reps - Standing Circular Shoulder Pendulum Supported with Arm Bent  - 2 x daily - 7 x weekly - 1 sets - 10 reps - Seated Elbow Flexion AAROM  - 2 x daily - 7 x weekly - 1 sets - 10 reps - Seated Shoulder Flexion Self PROM  - 3 x daily - 7 x weekly - 1 sets - 10 reps - 5-10 second hold - Supine Shoulder External Rotation with Dowel  - 3 x daily - 7 x weekly - 1 sets - 10 reps - 5-10 seconds hold  Access Code: [email or web address] URL: [email or web address] Date: 11/16/2023 Prepared by: Dorene Grebe  Exercises - Seated Cervical Sidebending AROM  - 2 x daily - 7 x weekly - 1 sets - 10 reps - Circular Shoulder Pendulum with Table Support  - 2 x daily - 7 x weekly - 1 sets - 10 reps - Seated Shoulder Flexion AAROM with Pulley Behind  - 2 x daily - 7 x weekly - 1 sets - 20 reps - Seated Shoulder Scaption AAROM with Pulley at Side  - 2 x daily - 7 x weekly - 1 sets - 20 reps - Seated Shoulder Abduction AAROM with Pulley Behind  - 2 x daily - 7 x weekly - 1 sets - 20 reps - Supine Shoulder External Rotation in 45 Degrees Abduction AAROM with Dowel  - 2 x daily - 7 x weekly - 1 sets - 10 reps  Access Code: [email or web address] URL: [email or web address] Date: 11/28/2023 Prepared by: Dorene Grebe  Exercises - Seated Shoulder Flexion AAROM with Pulley Behind  - 2 x daily - 7 x weekly - 1 sets - 20 reps - Seated Shoulder Scaption AAROM with Pulley at Side  - 2 x daily - 7 x weekly - 1 sets - 20 reps - Seated Shoulder Abduction AAROM with Pulley Behind  - 2 x daily - 7 x weekly - 1 sets - 20 reps - Supine Shoulder Flexion with Dowel  - 1 x daily - 7 x weekly - 2 sets - 10 reps - Supine Shoulder Press AAROM in Abduction with Dowel  - 2 x daily - 7 x weekly - 2 sets - 10 reps - Standing Shoulder Flexion Wall Walk  - 2 x daily - 7 x weekly - 2 sets - 10 reps - Sidelying Shoulder External Rotation  - 2 x daily - 7 x weekly -  2 sets - 10 reps   ASSESSMENT:  CLINICAL IMPRESSION:    No treatment today and pt. Rescheduled for tomorrow.  Pt. Emailed updated HEP per MD protocol.   Pt will benefit from continued PT services upon discharge to safely address deficits listed in patient problem list for decreased caregiver assistance and eventual return to PLOF.  OBJECTIVE IMPAIRMENTS: decreased ROM, decreased strength, hypomobility, impaired UE functional use, and pain.   ACTIVITY LIMITATIONS: carrying, bathing, dressing, reach over head, and hygiene/grooming  PARTICIPATION LIMITATIONS: meal prep, cleaning, laundry, driving, community activity, occupation, and yard work  PERSONAL FACTORS: Age and 1 comorbidity: adhesive capsulitis  are also affecting patient's functional outcome.   REHAB POTENTIAL: Good  CLINICAL DECISION MAKING: Stable/uncomplicated  EVALUATION COMPLEXITY: Low   GOALS:   SHORT TERM GOALS: Target date: 11/16/2023  Pt will initiate PROM HEP after 10/27/2023 in order to improve L shoulder AROM while abiding by surgical protocols. Baseline: Initiate 2 weeks post-surgery (10/13/2023) Goal status: Goal met  2.  Pt will begin to wean off sling use after 11/10/2023 in order to restore functional mobility in her L shoulder while abiding by surgical protocols. Baseline: Initiate 4 weeks post-surgery (10/13/2023) Goal status: Goal met  3.  Pt will demonstrate 90 degrees of L shoulder flexion PROM and 30 degrees of L shoulder ER PROM by 11/02/2023 in order to achieve surgeon-specific PROM goals. Baseline: N/A Goal status: Goal met   LONG TERM GOALS: Target date: 12/13/2022  Pt will improve FOTO score to at least 63 in order to demonstrate improvement in pain and function with ADL's. Baseline: 41.  12/9: 42 Goal status: Not met  2.  Pt will achieve 120 degrees of L shoulder flexion PROM and 45 degrees of L shoulder ER PROM in order to achieve surgeon-specific PROM goals.  Baseline: 12/9:  limited per protocol Goal status: Goal met  3.  Pt will report being able to function without sling by week 6-8 (12/12 - 12/26) in order improve function and use of her L UE. Baseline: N/A Goal status: Partially met  4.  Pt will improve L shoulder MMT scores to at least 4/5 to demonstrate improvement in L shoulder strength. Baseline: Not tested, to be assessed when appropriate going by protocol (post-op month 3 = January 31st) Goal status: Not met  5.  Pt will report <2/10 pain on NPS scale in order to show clinically significant improvement in pain at rest and with mobility. Baseline: 7/10 pain at rest.  12/9: 3-5/10 (slight increase in L shoulder pain this past weekend).   Goal status: Not met   PLAN:  PT FREQUENCY: 2x/week  PT DURATION: 8 weeks  PLANNED INTERVENTIONS: 97164- PT Re-evaluation, 97110-Therapeutic exercises, 97530- Therapeutic activity, 97112- Neuromuscular re-education, 97140- Manual therapy, Joint mobilization, Cryotherapy, and Moist heat  PLAN FOR NEXT SESSION: Follow MD protocol/  Progress HEP  Cammie Mcgee, PT, DPT # [id number] Physical Therapist - [place]  [facility]  7:22 AM,11/28/23

## 2023-11-29 ENCOUNTER — Ambulatory Visit: Payer: BC Managed Care – PPO | Admitting: Physical Therapy

## 2023-11-29 ENCOUNTER — Encounter: Payer: Self-pay | Admitting: Physical Therapy

## 2023-11-29 DIAGNOSIS — Z9889 Other specified postprocedural states: Secondary | ICD-10-CM

## 2023-11-29 DIAGNOSIS — M25612 Stiffness of left shoulder, not elsewhere classified: Secondary | ICD-10-CM

## 2023-11-29 DIAGNOSIS — G8929 Other chronic pain: Secondary | ICD-10-CM

## 2023-11-29 NOTE — Therapy (Addendum)
OUTPATIENT PHYSICAL THERAPY SHOULDER TREATMENT   Patient Name: Hannah Cross MRN: [id number] DOB:May 18, 1956, 67 y.o., female Today's Date: 11/29/2023  END OF SESSION:  PT End of Session - 11/29/23 0945     Visit Number 12    Number of Visits 16    Date for PT Re-Evaluation 12/14/23    PT Start Time 0945    PT Stop Time 1033    PT Time Calculation (min) 48 min    Activity Tolerance Patient limited by pain    Behavior During Therapy [facility] for tasks assessed/performed             Past Surgical History:  Procedure Laterality Date   ABDOMINAL HYSTERECTOMY     OOPHORECTOMY     Patient Active Problem List   Diagnosis Date Noted   Syncope 08/06/2019    PCP: Titus Mould, NP  REFERRING PROVIDER: Fletcher Anon, MD  REFERRING DIAG: Incomplete rotator cuff tear or rupture of left shoulder, not specified as traumatic; adhesive capsulitis of left shoulder  THERAPY DIAG:  S/P arthroscopy of left shoulder  Chronic left shoulder pain  Stiffness of left shoulder, not elsewhere classified  S/P left rotator cuff repair  Rationale for Evaluation and Treatment: Rehabilitation  ONSET DATE: 10/13/2023    SUBJECTIVE:                                                                                                                                                                                      SUBJECTIVE STATEMENT: Pt reports to PT s/p L shoulder arthroscopy with rotator cuff repair, extensive debridement, tenodesis of long tendon of biceps, decompression of subacromial space with partial acromioplasty and distal claviculectomy (10/13/2023). Pt reports dealing with chronic bilateral shoulder pain for most of this year and received injections in both shoulders (L side in July); pt reported improvement in pain with her R shoulder but no improvement with her left, which is what resulted in her having the surgery. She says her pain is usually worse at night and  she is having trouble sleeping because of the pain. Pt reports all her sensation has returned since the numbing block wore off earlier this week.  Hand dominance: Right  PERTINENT HISTORY: Adhesive capsulitis, stress fx of L fibula (September 2024)  PAIN:  Are you having pain? Yes: NPRS scale: 7/10 now, 9/10 worst, 2/10 best Pain location: L shoulder Pain description: Sharp, burning Aggravating factors: Movement, having shoulder at rest without support Relieving factors: Ibuprofen  PRECAUTIONS: Shoulder  RED FLAGS: None   WEIGHT BEARING RESTRICTIONS: Yes NWB LUE for first 6 weeks after surgery  FALLS:  Has patient fallen in last 6  months? No  LIVING ENVIRONMENT: Lives with: lives with their spouse Lives in: House/apartment Stairs: Yes: External: 3 steps; none Has following equipment at home: None  OCCUPATION: Safety Specialist  PLOF: Independent  PATIENT GOALS:Get normal ROM back with no pain, get back to cooking for her family  NEXT MD VISIT: TBD  OBJECTIVE:  Note: Objective measures were completed at Evaluation unless otherwise noted.  DIAGNOSTIC FINDINGS:  N/A  PATIENT SURVEYS:  FOTO 41/63  COGNITION: Overall cognitive status: Within functional limits for tasks assessed     SENSATION: Not tested (pt reports sensation has returned since numbing block wore off)  POSTURE: No significant postural limitations noted  UPPER EXTREMITY ROM:   Passive/Active ROM Right Eval (Active)/In sitting Left eval  Shoulder flexion 166 90 (abiding by protocol)  Shoulder extension 75   Shoulder abduction 160   Shoulder adduction    Shoulder internal rotation [facility]   Shoulder external rotation WFL ~10 degrees (abiding by protocol)  Elbow flexion [facility] WFL  Elbow extension [facility] WFL  Wrist flexion    Wrist extension    Wrist ulnar deviation    Wrist radial deviation    Wrist pronation    Wrist supination    (Blank rows = not tested)  UPPER EXTREMITY MMT:  MMT  Right eval Left eval  Shoulder flexion 5   Shoulder extension 5   Shoulder abduction 5   Shoulder adduction    Shoulder internal rotation 5   Shoulder external rotation 4   Middle trapezius    Lower trapezius    Elbow flexion    Elbow extension    Wrist flexion    Wrist extension    Wrist ulnar deviation    Wrist radial deviation    Wrist pronation    Wrist supination    Grip strength (lbs) 36.9 lbs average   (Blank rows = not tested)     TODAY'S TREATMENT:                                                                                                                                         DATE: 11/29/2023    Subjective:  Patient reports MD is happy with progress and returns to MD in 4 weeks (12/28/23).  Pt. C/o 8/10 L shoulder pain rest.  Pt. Able to wash coffee cup yesterday with no issues.    There.ex.:  Standing wall ladder sh. Flexion to red balloon.  Good technique/ control.   Seated L shoulder AAROM (flexion/ abduction) with cuing for isometric holds/ eccentric control (PT assist for safety)- 10x each.   Reviewed shoulder pulley flexion/ abduction with pulley and added isometric holds/ eccentric control.    Seated/standing scapular retraction 20x with tactile feedback.     Walking in clinic/ hallway without use of sling and focus on recip. Gait pattern/ consistent arm swing.    Seated B shoulder wand ex.: chest press/  sh. Flexion/ abduction 10x each with holds.        STM to L UT/shoulder/scapular musculature 5 min.  Generalized soreness in L shoulder.    Grip strength: R 51#, L 32#.    Patient to ice at home    PATIENT EDUCATION: Education details: Role of PT, PT plan of care, icing Person educated: Patient Education method: Explanation and Demonstration Education comprehension: verbalized understanding  HOME EXERCISE PROGRAM: Access Code: [email or web address] URL: [email or web address] Date: 10/26/2023 Prepared by: Dorene Grebe Exercises - Seated  Scapular Retraction - 2 x daily - 7 x weekly - 1 sets - 10 reps - Seated Cervical Rotation AROM - 2 x daily - 7 x weekly - 1 sets - 10 reps - Seated Cervical Sidebending AROM - 2 x daily - 7 x weekly - 1 sets - 10 reps - Standing Circular Shoulder Pendulum Supported with Arm Bent - 2 x daily - 7 x weekly - 1 sets - 10 reps - Seated Elbow Flexion AAROM - 2 x daily - 7 x weekly - 1 sets - 10 reps - Seated AAROM Elbow Supination/Pronation with Clasped Hands - 2 x daily - 7 x weekly - 1 sets - 10 reps  Access Code: [email or web address] URL: [email or web address] Date: 10/28/2023 Prepared by: Dorene Grebe  Exercises - Seated Scapular Retraction  - 2 x daily - 7 x weekly - 1 sets - 10 reps - Seated Cervical Rotation AROM  - 2 x daily - 7 x weekly - 1 sets - 10 reps - Seated Cervical Sidebending AROM  - 2 x daily - 7 x weekly - 1 sets - 10 reps - Standing Circular Shoulder Pendulum Supported with Arm Bent  - 2 x daily - 7 x weekly - 1 sets - 10 reps - Seated Elbow Flexion AAROM  - 2 x daily - 7 x weekly - 1 sets - 10 reps - Seated Shoulder Flexion Self PROM  - 3 x daily - 7 x weekly - 1 sets - 10 reps - 5-10 second hold - Supine Shoulder External Rotation with Dowel  - 3 x daily - 7 x weekly - 1 sets - 10 reps - 5-10 seconds hold  Access Code: [email or web address] URL: [email or web address] Date: 11/16/2023 Prepared by: Dorene Grebe  Exercises - Seated Cervical Sidebending AROM  - 2 x daily - 7 x weekly - 1 sets - 10 reps - Circular Shoulder Pendulum with Table Support  - 2 x daily - 7 x weekly - 1 sets - 10 reps - Seated Shoulder Flexion AAROM with Pulley Behind  - 2 x daily - 7 x weekly - 1 sets - 20 reps - Seated Shoulder Scaption AAROM with Pulley at Side  - 2 x daily - 7 x weekly - 1 sets - 20 reps - Seated Shoulder Abduction AAROM with Pulley Behind  - 2 x daily - 7 x weekly - 1 sets - 20 reps - Supine Shoulder External Rotation in 45 Degrees Abduction AAROM with Dowel  - 2 x daily -  7 x weekly - 1 sets - 10 reps   ASSESSMENT:  CLINICAL IMPRESSION:    Patient arrives to treatment session without sling and reports she is spending more time out of the sling except with grandchildren and in public. Session focused on L shoulder A/AROM within protocol and introduction of isometric holds/ex. Patient with general L shoulder soreness with STM and movement. Pt will benefit from continued PT services upon discharge to safely address deficits listed  in patient problem list for decreased caregiver assistance and eventual return to PLOF.    OBJECTIVE IMPAIRMENTS: decreased ROM, decreased strength, hypomobility, impaired UE functional use, and pain.   ACTIVITY LIMITATIONS: carrying, bathing, dressing, reach over head, and hygiene/grooming  PARTICIPATION LIMITATIONS: meal prep, cleaning, laundry, driving, community activity, occupation, and yard work  PERSONAL FACTORS: Age and 1 comorbidity: adhesive capsulitis  are also affecting patient's functional outcome.   REHAB POTENTIAL: Good  CLINICAL DECISION MAKING: Stable/uncomplicated  EVALUATION COMPLEXITY: Low   GOALS:   SHORT TERM GOALS: Target date: 11/16/2023  Pt will initiate PROM HEP after 10/27/2023 in order to improve L shoulder AROM while abiding by surgical protocols. Baseline: Initiate 2 weeks post-surgery (10/13/2023) Goal status: Goal met  2.  Pt will begin to wean off sling use after 11/10/2023 in order to restore functional mobility in her L shoulder while abiding by surgical protocols. Baseline: Initiate 4 weeks post-surgery (10/13/2023) Goal status: Goal met  3.  Pt will demonstrate 90 degrees of L shoulder flexion PROM and 30 degrees of L shoulder ER PROM by 11/02/2023 in order to achieve surgeon-specific PROM goals. Baseline: N/A Goal status: Goal met   LONG TERM GOALS: Target date: 12/13/2022  Pt will improve FOTO score to at least 63 in order to demonstrate improvement in pain and function with  ADL's. Baseline: 41.  12/9: 42 Goal status: Not met  2.  Pt will achieve 120 degrees of L shoulder flexion PROM and 45 degrees of L shoulder ER PROM in order to achieve surgeon-specific PROM goals.  Baseline: 12/9: limited per protocol Goal status: Goal met  3.  Pt will report being able to function without sling by week 6-8 (12/12 - 12/26) in order improve function and use of her L UE. Baseline: N/A Goal status: Partially met  4.  Pt will improve L shoulder MMT scores to at least 4/5 to demonstrate improvement in L shoulder strength. Baseline: Not tested, to be assessed when appropriate going by protocol (post-op month 3 = January 31st) Goal status: Not met  5.  Pt will report <2/10 pain on NPS scale in order to show clinically significant improvement in pain at rest and with mobility. Baseline: 7/10 pain at rest.  12/9: 3-5/10 (slight increase in L shoulder pain this past weekend).   Goal status: Not met   PLAN:  PT FREQUENCY: 2x/week  PT DURATION: 8 weeks  PLANNED INTERVENTIONS: 97164- PT Re-evaluation, 97110-Therapeutic exercises, 97530- Therapeutic activity, 97112- Neuromuscular re-education, 97140- Manual therapy, Joint mobilization, Cryotherapy, and Moist heat  PLAN FOR NEXT SESSION: Follow MD protocol/ Progress HEP  Cammie Mcgee, PT, DPT # [phone] Physical Therapist - [place]  [facility]  6:11 PM,11/29/23

## 2023-12-01 ENCOUNTER — Encounter: Payer: Self-pay | Admitting: Physical Therapy

## 2023-12-01 ENCOUNTER — Ambulatory Visit: Payer: BC Managed Care – PPO | Admitting: Physical Therapy

## 2023-12-01 DIAGNOSIS — M25612 Stiffness of left shoulder, not elsewhere classified: Secondary | ICD-10-CM

## 2023-12-01 DIAGNOSIS — Z9889 Other specified postprocedural states: Secondary | ICD-10-CM | POA: Diagnosis not present

## 2023-12-01 DIAGNOSIS — G8929 Other chronic pain: Secondary | ICD-10-CM

## 2023-12-01 NOTE — Therapy (Signed)
OUTPATIENT PHYSICAL THERAPY SHOULDER TREATMENT   Patient Name: Hannah Cross MRN: [id number] DOB:Apr 06, 1956, 67 y.o., female Today's Date: 12/01/2023  END OF SESSION:  PT End of Session - 12/01/23 0944     Visit Number 13    Number of Visits 16    Date for PT Re-Evaluation 12/14/23    PT Start Time 0944    Activity Tolerance Patient limited by pain    Behavior During Therapy [facility] for tasks assessed/performed            0944 to 1034  (50 minutes)  Past Surgical History:  Procedure Laterality Date   ABDOMINAL HYSTERECTOMY     OOPHORECTOMY     Patient Active Problem List   Diagnosis Date Noted   Syncope 08/06/2019    PCP: Titus Mould, NP  REFERRING PROVIDER: Fletcher Anon, MD  REFERRING DIAG: Incomplete rotator cuff tear or rupture of left shoulder, not specified as traumatic; adhesive capsulitis of left shoulder  THERAPY DIAG:  S/P arthroscopy of left shoulder  Chronic left shoulder pain  Stiffness of left shoulder, not elsewhere classified  S/P left rotator cuff repair  Rationale for Evaluation and Treatment: Rehabilitation  ONSET DATE: 10/13/2023    SUBJECTIVE:                                                                                                                                                                                      SUBJECTIVE STATEMENT: Pt reports to PT s/p L shoulder arthroscopy with rotator cuff repair, extensive debridement, tenodesis of long tendon of biceps, decompression of subacromial space with partial acromioplasty and distal claviculectomy (10/13/2023). Pt reports dealing with chronic bilateral shoulder pain for most of this year and received injections in both shoulders (L side in July); pt reported improvement in pain with her R shoulder but no improvement with her left, which is what resulted in her having the surgery. She says her pain is usually worse at night and she is having trouble sleeping  because of the pain. Pt reports all her sensation has returned since the numbing block wore off earlier this week.  Hand dominance: Right  PERTINENT HISTORY: Adhesive capsulitis, stress fx of L fibula (September 2024)  PAIN:  Are you having pain? Yes: NPRS scale: 7/10 now, 9/10 worst, 2/10 best Pain location: L shoulder Pain description: Sharp, burning Aggravating factors: Movement, having shoulder at rest without support Relieving factors: Ibuprofen  PRECAUTIONS: Shoulder  RED FLAGS: None   WEIGHT BEARING RESTRICTIONS: Yes NWB LUE for first 6 weeks after surgery  FALLS:  Has patient fallen in last 6 months? No  LIVING ENVIRONMENT: Lives with: lives with their  spouse Lives in: House/apartment Stairs: Yes: External: 3 steps; none Has following equipment at home: None  OCCUPATION: Safety Specialist  PLOF: Independent  PATIENT GOALS:Get normal ROM back with no pain, get back to cooking for her family  NEXT MD VISIT: TBD  OBJECTIVE:  Note: Objective measures were completed at Evaluation unless otherwise noted.  DIAGNOSTIC FINDINGS:  N/A  PATIENT SURVEYS:  FOTO 41/63  COGNITION: Overall cognitive status: Within functional limits for tasks assessed     SENSATION: Not tested (pt reports sensation has returned since numbing block wore off)  POSTURE: No significant postural limitations noted  UPPER EXTREMITY ROM:   Passive/Active ROM Right Eval (Active)/In sitting Left eval  Shoulder flexion 166 90 (abiding by protocol)  Shoulder extension 75   Shoulder abduction 160   Shoulder adduction    Shoulder internal rotation [facility]   Shoulder external rotation WFL ~10 degrees (abiding by protocol)  Elbow flexion [facility] WFL  Elbow extension [facility] WFL  Wrist flexion    Wrist extension    Wrist ulnar deviation    Wrist radial deviation    Wrist pronation    Wrist supination    (Blank rows = not tested)  UPPER EXTREMITY MMT:  MMT Right eval Left eval  Shoulder  flexion 5   Shoulder extension 5   Shoulder abduction 5   Shoulder adduction    Shoulder internal rotation 5   Shoulder external rotation 4   Middle trapezius    Lower trapezius    Elbow flexion    Elbow extension    Wrist flexion    Wrist extension    Wrist ulnar deviation    Wrist radial deviation    Wrist pronation    Wrist supination    Grip strength (lbs) 36.9 lbs average   (Blank rows = not tested)  12/17: Grip strength: R 51#, L 32#.     TODAY'S TREATMENT:                                                                                                                                         DATE: 12/01/2023    Subjective:  Patient reports MD is happy with progress and returns to MD in 4 weeks (12/28/23).  Pt. C/o 3/10 L shoulder pain rest.  Pt. Sore after last tx. But not too bad.    There.ex.:  Standing wall ladder sh. Flexion to red balloon.  Good technique/ control.   Standing shoulder pulley ex.: flexion/ abduction 10x.  Cuing for isometric holds.  Pain limited.    Seated L shoulder A/AROM with cane (flexion/ abduction) with cuing for isometric holds/ eccentric control (PT assist and use of cand for safety)- 10x each.   R sidelying L shoulder ER/ abduction AAROM with isometric holds at tolerable range (10x).     Standing scapular retraction with RTB 20x with tactile feedback.     Supine L  serratus punches AROM/ flexion AAROM to tolerable end-range (115 deg.)- pain limited range.   Seated B shoulder wand ex.: chest press/ sh. Flexion/ abduction 10x each with holds.        STM to L UT/shoulder/scapular musculature 4 min.  Generalized soreness in L shoulder.     Patient to ice at home    PATIENT EDUCATION: Education details: Role of PT, PT plan of care, icing Person educated: Patient Education method: Explanation and Demonstration Education comprehension: verbalized understanding  HOME EXERCISE PROGRAM: Access Code: [email or web address] URL:  [email or web address] Date: 10/26/2023 Prepared by: Dorene Grebe Exercises - Seated Scapular Retraction - 2 x daily - 7 x weekly - 1 sets - 10 reps - Seated Cervical Rotation AROM - 2 x daily - 7 x weekly - 1 sets - 10 reps - Seated Cervical Sidebending AROM - 2 x daily - 7 x weekly - 1 sets - 10 reps - Standing Circular Shoulder Pendulum Supported with Arm Bent - 2 x daily - 7 x weekly - 1 sets - 10 reps - Seated Elbow Flexion AAROM - 2 x daily - 7 x weekly - 1 sets - 10 reps - Seated AAROM Elbow Supination/Pronation with Clasped Hands - 2 x daily - 7 x weekly - 1 sets - 10 reps  Access Code: [email or web address] URL: [email or web address] Date: 10/28/2023 Prepared by: Dorene Grebe  Exercises - Seated Scapular Retraction  - 2 x daily - 7 x weekly - 1 sets - 10 reps - Seated Cervical Rotation AROM  - 2 x daily - 7 x weekly - 1 sets - 10 reps - Seated Cervical Sidebending AROM  - 2 x daily - 7 x weekly - 1 sets - 10 reps - Standing Circular Shoulder Pendulum Supported with Arm Bent  - 2 x daily - 7 x weekly - 1 sets - 10 reps - Seated Elbow Flexion AAROM  - 2 x daily - 7 x weekly - 1 sets - 10 reps - Seated Shoulder Flexion Self PROM  - 3 x daily - 7 x weekly - 1 sets - 10 reps - 5-10 second hold - Supine Shoulder External Rotation with Dowel  - 3 x daily - 7 x weekly - 1 sets - 10 reps - 5-10 seconds hold  Access Code: [email or web address] URL: [email or web address] Date: 11/16/2023 Prepared by: Dorene Grebe  Exercises - Seated Cervical Sidebending AROM  - 2 x daily - 7 x weekly - 1 sets - 10 reps - Circular Shoulder Pendulum with Table Support  - 2 x daily - 7 x weekly - 1 sets - 10 reps - Seated Shoulder Flexion AAROM with Pulley Behind  - 2 x daily - 7 x weekly - 1 sets - 20 reps - Seated Shoulder Scaption AAROM with Pulley at Side  - 2 x daily - 7 x weekly - 1 sets - 20 reps - Seated Shoulder Abduction AAROM with Pulley Behind  - 2 x daily - 7 x weekly - 1 sets - 20  reps - Supine Shoulder External Rotation in 45 Degrees Abduction AAROM with Dowel  - 2 x daily - 7 x weekly - 1 sets - 10 reps   ASSESSMENT:  CLINICAL IMPRESSION:    Patient arrives to treatment session without sling and improved arm swing.  Pt. Still guards L shoulder close to body with IR occasionally. Session focused on L shoulder A/AROM within protocol and progression of isometric holds/ex. Patient with general L shoulder soreness with STM and movement. Pt  will benefit from continued PT services upon discharge to safely address deficits listed in patient problem list for decreased caregiver assistance and eventual return to PLOF.    OBJECTIVE IMPAIRMENTS: decreased ROM, decreased strength, hypomobility, impaired UE functional use, and pain.   ACTIVITY LIMITATIONS: carrying, bathing, dressing, reach over head, and hygiene/grooming  PARTICIPATION LIMITATIONS: meal prep, cleaning, laundry, driving, community activity, occupation, and yard work  PERSONAL FACTORS: Age and 1 comorbidity: adhesive capsulitis  are also affecting patient's functional outcome.   REHAB POTENTIAL: Good  CLINICAL DECISION MAKING: Stable/uncomplicated  EVALUATION COMPLEXITY: Low   GOALS:   SHORT TERM GOALS: Target date: 11/16/2023  Pt will initiate PROM HEP after 10/27/2023 in order to improve L shoulder AROM while abiding by surgical protocols. Baseline: Initiate 2 weeks post-surgery (10/13/2023) Goal status: Goal met  2.  Pt will begin to wean off sling use after 11/10/2023 in order to restore functional mobility in her L shoulder while abiding by surgical protocols. Baseline: Initiate 4 weeks post-surgery (10/13/2023) Goal status: Goal met  3.  Pt will demonstrate 90 degrees of L shoulder flexion PROM and 30 degrees of L shoulder ER PROM by 11/02/2023 in order to achieve surgeon-specific PROM goals. Baseline: N/A Goal status: Goal met   LONG TERM GOALS: Target date: 12/13/2022  Pt will improve  FOTO score to at least 63 in order to demonstrate improvement in pain and function with ADL's. Baseline: 41.  12/9: 42 Goal status: Not met  2.  Pt will achieve 120 degrees of L shoulder flexion PROM and 45 degrees of L shoulder ER PROM in order to achieve surgeon-specific PROM goals.  Baseline: 12/9: limited per protocol Goal status: Goal met  3.  Pt will report being able to function without sling by week 6-8 (12/12 - 12/26) in order improve function and use of her L UE. Baseline: N/A Goal status: Partially met  4.  Pt will improve L shoulder MMT scores to at least 4/5 to demonstrate improvement in L shoulder strength. Baseline: Not tested, to be assessed when appropriate going by protocol (post-op month 3 = January 31st) Goal status: Not met  5.  Pt will report <2/10 pain on NPS scale in order to show clinically significant improvement in pain at rest and with mobility. Baseline: 7/10 pain at rest.  12/9: 3-5/10 (slight increase in L shoulder pain this past weekend).   Goal status: Not met   PLAN:  PT FREQUENCY: 2x/week  PT DURATION: 8 weeks  PLANNED INTERVENTIONS: 97164- PT Re-evaluation, 97110-Therapeutic exercises, 97530- Therapeutic activity, 97112- Neuromuscular re-education, 97140- Manual therapy, Joint mobilization, Cryotherapy, and Moist heat  PLAN FOR NEXT SESSION: Follow MD protocol/ Progress HEP  Cammie Mcgee, PT, DPT # [phone] Physical Therapist - [place]  [facility]  9:44 AM,12/01/23

## 2023-12-05 ENCOUNTER — Ambulatory Visit: Payer: BC Managed Care – PPO | Admitting: Physical Therapy

## 2023-12-08 ENCOUNTER — Ambulatory Visit: Payer: BC Managed Care – PPO | Admitting: Physical Therapy

## 2023-12-08 DIAGNOSIS — G8929 Other chronic pain: Secondary | ICD-10-CM

## 2023-12-08 DIAGNOSIS — M25612 Stiffness of left shoulder, not elsewhere classified: Secondary | ICD-10-CM

## 2023-12-08 DIAGNOSIS — Z9889 Other specified postprocedural states: Secondary | ICD-10-CM

## 2023-12-09 NOTE — Therapy (Signed)
OUTPATIENT PHYSICAL THERAPY SHOULDER TREATMENT  Patient Name: Hannah Cross MRN: [id number] DOB:October 27, 1956, 67 y.o., female Today's Date: 12/09/2023  END OF SESSION:  PT End of Session - 12/09/23 1728     Visit Number 14    Number of Visits 16    Date for PT Re-Evaluation 12/14/23    PT Start Time 1114    PT Stop Time 1202    PT Time Calculation (min) 48 min    Activity Tolerance Patient limited by pain    Behavior During Therapy [facility] for tasks assessed/performed            Past Surgical History:  Procedure Laterality Date   ABDOMINAL HYSTERECTOMY     OOPHORECTOMY     Patient Active Problem List   Diagnosis Date Noted   Syncope 08/06/2019    PCP: Titus Mould, NP  REFERRING PROVIDER: Fletcher Anon, MD  REFERRING DIAG: Incomplete rotator cuff tear or rupture of left shoulder, not specified as traumatic; adhesive capsulitis of left shoulder  THERAPY DIAG:  S/P arthroscopy of left shoulder  Chronic left shoulder pain  Stiffness of left shoulder, not elsewhere classified  S/P left rotator cuff repair  Rationale for Evaluation and Treatment: Rehabilitation  ONSET DATE: 10/13/2023    SUBJECTIVE:                                                                                                                                                                                      SUBJECTIVE STATEMENT: Pt reports to PT s/p L shoulder arthroscopy with rotator cuff repair, extensive debridement, tenodesis of long tendon of biceps, decompression of subacromial space with partial acromioplasty and distal claviculectomy (10/13/2023). Pt reports dealing with chronic bilateral shoulder pain for most of this year and received injections in both shoulders (L side in July); pt reported improvement in pain with her R shoulder but no improvement with her left, which is what resulted in her having the surgery. She says her pain is usually worse at night and she  is having trouble sleeping because of the pain. Pt reports all her sensation has returned since the numbing block wore off earlier this week.  Hand dominance: Right  PERTINENT HISTORY: Adhesive capsulitis, stress fx of L fibula (September 2024)  PAIN:  Are you having pain? Yes: NPRS scale: 7/10 now, 9/10 worst, 2/10 best Pain location: L shoulder Pain description: Sharp, burning Aggravating factors: Movement, having shoulder at rest without support Relieving factors: Ibuprofen  PRECAUTIONS: Shoulder  RED FLAGS: None   WEIGHT BEARING RESTRICTIONS: Yes NWB LUE for first 6 weeks after surgery  FALLS:  Has patient fallen in last 6 months? No  LIVING ENVIRONMENT: Lives with: lives with their spouse Lives in: House/apartment Stairs: Yes: External: 3 steps; none Has following equipment at home: None  OCCUPATION: Safety Specialist  PLOF: Independent  PATIENT GOALS:Get normal ROM back with no pain, get back to cooking for her family  NEXT MD VISIT: TBD  OBJECTIVE:  Note: Objective measures were completed at Evaluation unless otherwise noted.  DIAGNOSTIC FINDINGS:  N/A  PATIENT SURVEYS:  FOTO 41/63  COGNITION: Overall cognitive status: Within functional limits for tasks assessed     SENSATION: Not tested (pt reports sensation has returned since numbing block wore off)  POSTURE: No significant postural limitations noted  UPPER EXTREMITY ROM:   Passive/Active ROM Right Eval (Active)/In sitting Left eval  Shoulder flexion 166 90 (abiding by protocol)  Shoulder extension 75   Shoulder abduction 160   Shoulder adduction    Shoulder internal rotation [facility]   Shoulder external rotation WFL ~10 degrees (abiding by protocol)  Elbow flexion [facility] WFL  Elbow extension [facility] WFL  Wrist flexion    Wrist extension    Wrist ulnar deviation    Wrist radial deviation    Wrist pronation    Wrist supination    (Blank rows = not tested)  UPPER EXTREMITY MMT:  MMT  Right eval Left eval  Shoulder flexion 5   Shoulder extension 5   Shoulder abduction 5   Shoulder adduction    Shoulder internal rotation 5   Shoulder external rotation 4   Middle trapezius    Lower trapezius    Elbow flexion    Elbow extension    Wrist flexion    Wrist extension    Wrist ulnar deviation    Wrist radial deviation    Wrist pronation    Wrist supination    Grip strength (lbs) 36.9 lbs average   (Blank rows = not tested)  12/17: Grip strength: R 51#, L 32#.     TODAY'S TREATMENT:                                                                                                                                         DATE: 12/09/2023    Subjective:  Pt. returns to MD in 3 weeks (12/28/23).  Pt. C/o 3/10 L shoulder pain rest and reports she has been sick the past several days.  Pt. Has been limited with HEP due to being sick and busy over holiday.    There.ex.:  Discussed/ reviewed HEP   Seated L shoulder A/AROM with cane (flexion/ abduction) with cuing for isometric holds/ eccentric control (PT assist and use of cane for safety)- 10x each.    Supine L serratus punches AROM/ flexion AAROM to tolerable end-range (115 deg.)- pain limited range.   Supine L shoulder isometrics (flexion/ abduction/ IR/ ER)- 10x each with 5-10 sec. Holds.         STM to L UT/shoulder/scapular musculature  5 min.  Generalized soreness in L shoulder.    Reassessment of L shoulder AAROM in seated posture (flexion/ abduction).    Patient to ice at home    PATIENT EDUCATION: Education details: Role of PT, PT plan of care, icing Person educated: Patient Education method: Explanation and Demonstration Education comprehension: verbalized understanding  HOME EXERCISE PROGRAM: Access Code: [email or web address] URL: [email or web address] Date: 10/26/2023 Prepared by: Dorene Grebe Exercises - Seated Scapular Retraction - 2 x daily - 7 x weekly - 1 sets - 10 reps - Seated Cervical Rotation  AROM - 2 x daily - 7 x weekly - 1 sets - 10 reps - Seated Cervical Sidebending AROM - 2 x daily - 7 x weekly - 1 sets - 10 reps - Standing Circular Shoulder Pendulum Supported with Arm Bent - 2 x daily - 7 x weekly - 1 sets - 10 reps - Seated Elbow Flexion AAROM - 2 x daily - 7 x weekly - 1 sets - 10 reps - Seated AAROM Elbow Supination/Pronation with Clasped Hands - 2 x daily - 7 x weekly - 1 sets - 10 reps  Access Code: [email or web address] URL: [email or web address] Date: 10/28/2023 Prepared by: Dorene Grebe  Exercises - Seated Scapular Retraction  - 2 x daily - 7 x weekly - 1 sets - 10 reps - Seated Cervical Rotation AROM  - 2 x daily - 7 x weekly - 1 sets - 10 reps - Seated Cervical Sidebending AROM  - 2 x daily - 7 x weekly - 1 sets - 10 reps - Standing Circular Shoulder Pendulum Supported with Arm Bent  - 2 x daily - 7 x weekly - 1 sets - 10 reps - Seated Elbow Flexion AAROM  - 2 x daily - 7 x weekly - 1 sets - 10 reps - Seated Shoulder Flexion Self PROM  - 3 x daily - 7 x weekly - 1 sets - 10 reps - 5-10 second hold - Supine Shoulder External Rotation with Dowel  - 3 x daily - 7 x weekly - 1 sets - 10 reps - 5-10 seconds hold  Access Code: [email or web address] URL: [email or web address] Date: 11/16/2023 Prepared by: Dorene Grebe  Exercises - Seated Cervical Sidebending AROM  - 2 x daily - 7 x weekly - 1 sets - 10 reps - Circular Shoulder Pendulum with Table Support  - 2 x daily - 7 x weekly - 1 sets - 10 reps - Seated Shoulder Flexion AAROM with Pulley Behind  - 2 x daily - 7 x weekly - 1 sets - 20 reps - Seated Shoulder Scaption AAROM with Pulley at Side  - 2 x daily - 7 x weekly - 1 sets - 20 reps - Seated Shoulder Abduction AAROM with Pulley Behind  - 2 x daily - 7 x weekly - 1 sets - 20 reps - Supine Shoulder External Rotation in 45 Degrees Abduction AAROM with Dowel  - 2 x daily - 7 x weekly - 1 sets - 10 reps   ASSESSMENT:  CLINICAL IMPRESSION:    Patient arrives  to treatment session without sling and improved arm swing.  Pt. Still guards L shoulder close to body with IR occasionally. Session focused on L shoulder A/AROM within protocol and progression of isometric holds/ex. Patient with general L shoulder soreness with STM and movement. Pt will benefit from continued PT services upon discharge to safely address deficits listed in patient problem list for decreased caregiver assistance and eventual return to PLOF.  OBJECTIVE IMPAIRMENTS: decreased ROM, decreased strength, hypomobility, impaired UE functional use, and pain.   ACTIVITY LIMITATIONS: carrying, bathing, dressing, reach over head, and hygiene/grooming  PARTICIPATION LIMITATIONS: meal prep, cleaning, laundry, driving, community activity, occupation, and yard work  PERSONAL FACTORS: Age and 1 comorbidity: adhesive capsulitis  are also affecting patient's functional outcome.   REHAB POTENTIAL: Good  CLINICAL DECISION MAKING: Stable/uncomplicated  EVALUATION COMPLEXITY: Low   GOALS:   SHORT TERM GOALS: Target date: 11/16/2023  Pt will initiate PROM HEP after 10/27/2023 in order to improve L shoulder AROM while abiding by surgical protocols. Baseline: Initiate 2 weeks post-surgery (10/13/2023) Goal status: Goal met  2.  Pt will begin to wean off sling use after 11/10/2023 in order to restore functional mobility in her L shoulder while abiding by surgical protocols. Baseline: Initiate 4 weeks post-surgery (10/13/2023) Goal status: Goal met  3.  Pt will demonstrate 90 degrees of L shoulder flexion PROM and 30 degrees of L shoulder ER PROM by 11/02/2023 in order to achieve surgeon-specific PROM goals. Baseline: N/A Goal status: Goal met   LONG TERM GOALS: Target date: 12/13/2022  Pt will improve FOTO score to at least 63 in order to demonstrate improvement in pain and function with ADL's. Baseline: 41.  12/9: 42 Goal status: Not met  2.  Pt will achieve 120 degrees of L shoulder  flexion PROM and 45 degrees of L shoulder ER PROM in order to achieve surgeon-specific PROM goals.  Baseline: 12/9: limited per protocol Goal status: Goal met  3.  Pt will report being able to function without sling by week 6-8 (12/12 - 12/26) in order improve function and use of her L UE. Baseline: N/A Goal status: Partially met  4.  Pt will improve L shoulder MMT scores to at least 4/5 to demonstrate improvement in L shoulder strength. Baseline: Not tested, to be assessed when appropriate going by protocol (post-op month 3 = January 31st) Goal status: Not met  5.  Pt will report <2/10 pain on NPS scale in order to show clinically significant improvement in pain at rest and with mobility. Baseline: 7/10 pain at rest.  12/9: 3-5/10 (slight increase in L shoulder pain this past weekend).   Goal status: Not met   PLAN:  PT FREQUENCY: 2x/week  PT DURATION: 8 weeks  PLANNED INTERVENTIONS: 97164- PT Re-evaluation, 97110-Therapeutic exercises, 97530- Therapeutic activity, 97112- Neuromuscular re-education, 97140- Manual therapy, Joint mobilization, Cryotherapy, and Moist heat  PLAN FOR NEXT SESSION: Follow MD protocol/ CHECK GOALS/ issued updated HEP  Cammie Mcgee, PT, DPT # [id number] Physical Therapist - [place]  [facility]  5:29 PM,12/09/23

## 2023-12-12 ENCOUNTER — Encounter: Payer: Self-pay | Admitting: Physical Therapy

## 2023-12-12 ENCOUNTER — Ambulatory Visit: Payer: BC Managed Care – PPO | Admitting: Physical Therapy

## 2023-12-12 DIAGNOSIS — Z9889 Other specified postprocedural states: Secondary | ICD-10-CM | POA: Diagnosis not present

## 2023-12-12 DIAGNOSIS — G8929 Other chronic pain: Secondary | ICD-10-CM

## 2023-12-12 DIAGNOSIS — M25612 Stiffness of left shoulder, not elsewhere classified: Secondary | ICD-10-CM

## 2023-12-12 NOTE — Therapy (Signed)
OUTPATIENT PHYSICAL THERAPY SHOULDER TREATMENT  Patient Name: Hannah Cross MRN: [id number] DOB:Sep 17, 1956, 67 y.o., female Today's Date: 12/12/2023  END OF SESSION:  PT End of Session - 12/12/23 1001     Visit Number 15    Number of Visits 16    Date for PT Re-Evaluation 12/14/23    PT Start Time 0947    PT Stop Time 1033    PT Time Calculation (min) 46 min    Activity Tolerance Patient limited by pain    Behavior During Therapy [facility] for tasks assessed/performed            Past Surgical History:  Procedure Laterality Date   ABDOMINAL HYSTERECTOMY     OOPHORECTOMY     Patient Active Problem List   Diagnosis Date Noted   Syncope 08/06/2019    PCP: Titus Mould, NP  REFERRING PROVIDER: Fletcher Anon, MD  REFERRING DIAG: Incomplete rotator cuff tear or rupture of left shoulder, not specified as traumatic; adhesive capsulitis of left shoulder  THERAPY DIAG:  S/P arthroscopy of left shoulder  Chronic left shoulder pain  Stiffness of left shoulder, not elsewhere classified  S/P left rotator cuff repair  Rationale for Evaluation and Treatment: Rehabilitation  ONSET DATE: 10/13/2023    SUBJECTIVE:                                                                                                                                                                                      SUBJECTIVE STATEMENT: Pt reports to PT s/p L shoulder arthroscopy with rotator cuff repair, extensive debridement, tenodesis of long tendon of biceps, decompression of subacromial space with partial acromioplasty and distal claviculectomy (10/13/2023). Pt reports dealing with chronic bilateral shoulder pain for most of this year and received injections in both shoulders (L side in July); pt reported improvement in pain with her R shoulder but no improvement with her left, which is what resulted in her having the surgery. She says her pain is usually worse at night and she  is having trouble sleeping because of the pain. Pt reports all her sensation has returned since the numbing block wore off earlier this week.  Hand dominance: Right  PERTINENT HISTORY: Adhesive capsulitis, stress fx of L fibula (September 2024)  PAIN:  Are you having pain? Yes: NPRS scale: 7/10 now, 9/10 worst, 2/10 best Pain location: L shoulder Pain description: Sharp, burning Aggravating factors: Movement, having shoulder at rest without support Relieving factors: Ibuprofen  PRECAUTIONS: Shoulder  RED FLAGS: None   WEIGHT BEARING RESTRICTIONS: Yes NWB LUE for first 6 weeks after surgery  FALLS:  Has patient fallen in last 6 months? No  LIVING ENVIRONMENT: Lives with: lives with their spouse Lives in: House/apartment Stairs: Yes: External: 3 steps; none Has following equipment at home: None  OCCUPATION: Safety Specialist  PLOF: Independent  PATIENT GOALS:Get normal ROM back with no pain, get back to cooking for her family  NEXT MD VISIT: TBD  OBJECTIVE:  Note: Objective measures were completed at Evaluation unless otherwise noted.  DIAGNOSTIC FINDINGS:  N/A  PATIENT SURVEYS:  FOTO 41/63  COGNITION: Overall cognitive status: Within functional limits for tasks assessed     SENSATION: Not tested (pt reports sensation has returned since numbing block wore off)  POSTURE: No significant postural limitations noted  UPPER EXTREMITY ROM:   Passive/Active ROM Right Eval (Active)/In sitting Left eval  Shoulder flexion 166 90 (abiding by protocol)  Shoulder extension 75   Shoulder abduction 160   Shoulder adduction    Shoulder internal rotation [facility]   Shoulder external rotation WFL ~10 degrees (abiding by protocol)  Elbow flexion [facility] WFL  Elbow extension [facility] WFL  Wrist flexion    Wrist extension    Wrist ulnar deviation    Wrist radial deviation    Wrist pronation    Wrist supination    (Blank rows = not tested)  UPPER EXTREMITY MMT:  MMT  Right eval Left eval  Shoulder flexion 5   Shoulder extension 5   Shoulder abduction 5   Shoulder adduction    Shoulder internal rotation 5   Shoulder external rotation 4   Middle trapezius    Lower trapezius    Elbow flexion    Elbow extension    Wrist flexion    Wrist extension    Wrist ulnar deviation    Wrist radial deviation    Wrist pronation    Wrist supination    Grip strength (lbs) 36.9 lbs average   (Blank rows = not tested)  12/17: Grip strength: R 51#, L 32#.     TODAY'S TREATMENT:                                                                                                                                         DATE: 12/12/2023    Subjective:  Pt. returns to MD in 2 weeks (12/28/23).  Pt. C/o 3-4/10 L shoulder pain during L shoulder reassessment.  Pt. Has been limited with HEP due to being sick and busy over holiday.    There.ex.:  Seated/ standing L shoulder A/AROM with cane (flexion/ abduction/ER) with cuing for isometric holds/ eccentric control (PT assist and use of cane for safety)- 10x each.   Standing wall ladder: sh. Flexion/ scaption/ abduction 5x each.  Challenged with abduction.     Nautilus: seated lat. Pull down with palms up/down 30# (with shoulder flexion static holds)- 20x.  No increase c/o pain.    Supine L shoulder isometrics (flexion/ abduction/ IR/ ER)- 10x each with 5-10 sec. Holds.  STM to L UT/shoulder/scapular musculature 5 min.  Generalized soreness in L shoulder.    Reassessment of L shoulder AAROM in seated posture (flexion/ abduction).    SEE UPDATED HEP  Patient to ice at home    PATIENT EDUCATION: Education details: Role of PT, PT plan of care, icing Person educated: Patient Education method: Explanation and Demonstration Education comprehension: verbalized understanding  HOME EXERCISE PROGRAM: Access Code: [email or web address] URL: [email or web address] Date: 10/26/2023 Prepared by: Dorene Grebe Exercises -  Seated Scapular Retraction - 2 x daily - 7 x weekly - 1 sets - 10 reps - Seated Cervical Rotation AROM - 2 x daily - 7 x weekly - 1 sets - 10 reps - Seated Cervical Sidebending AROM - 2 x daily - 7 x weekly - 1 sets - 10 reps - Standing Circular Shoulder Pendulum Supported with Arm Bent - 2 x daily - 7 x weekly - 1 sets - 10 reps - Seated Elbow Flexion AAROM - 2 x daily - 7 x weekly - 1 sets - 10 reps - Seated AAROM Elbow Supination/Pronation with Clasped Hands - 2 x daily - 7 x weekly - 1 sets - 10 reps  Access Code: [email or web address] URL: [email or web address] Date: 10/28/2023 Prepared by: Dorene Grebe  Exercises - Seated Scapular Retraction  - 2 x daily - 7 x weekly - 1 sets - 10 reps - Seated Cervical Rotation AROM  - 2 x daily - 7 x weekly - 1 sets - 10 reps - Seated Cervical Sidebending AROM  - 2 x daily - 7 x weekly - 1 sets - 10 reps - Standing Circular Shoulder Pendulum Supported with Arm Bent  - 2 x daily - 7 x weekly - 1 sets - 10 reps - Seated Elbow Flexion AAROM  - 2 x daily - 7 x weekly - 1 sets - 10 reps - Seated Shoulder Flexion Self PROM  - 3 x daily - 7 x weekly - 1 sets - 10 reps - 5-10 second hold - Supine Shoulder External Rotation with Dowel  - 3 x daily - 7 x weekly - 1 sets - 10 reps - 5-10 seconds hold  Access Code: [email or web address] URL: [email or web address] Date: 11/16/2023 Prepared by: Dorene Grebe  Exercises - Seated Cervical Sidebending AROM  - 2 x daily - 7 x weekly - 1 sets - 10 reps - Circular Shoulder Pendulum with Table Support  - 2 x daily - 7 x weekly - 1 sets - 10 reps - Seated Shoulder Flexion AAROM with Pulley Behind  - 2 x daily - 7 x weekly - 1 sets - 20 reps - Seated Shoulder Scaption AAROM with Pulley at Side  - 2 x daily - 7 x weekly - 1 sets - 20 reps - Seated Shoulder Abduction AAROM with Pulley Behind  - 2 x daily - 7 x weekly - 1 sets - 20 reps - Supine Shoulder External Rotation in 45 Degrees Abduction AAROM with Dowel  - 2 x  daily - 7 x weekly - 1 sets - 10 reps  Access Code: [email or web address] URL: [email or web address] Date: 12/12/2023 Prepared by: Dorene Grebe  Exercises - Seated Shoulder Flexion AAROM with Pulley Behind  - 2 x daily - 7 x weekly - 1 sets - 20 reps - Seated Shoulder Scaption AAROM with Pulley at Side  - 2 x daily - 7 x weekly - 1 sets - 20 reps - Seated Shoulder Abduction AAROM with Pulley Behind  - 2 x daily - 7  x weekly - 1 sets - 20 reps - Standing Shoulder Flexion Wall Walk  - 2 x daily - 7 x weekly - 2 sets - 10 reps - Sidelying Shoulder External Rotation  - 2 x daily - 7 x weekly - 2 sets - 10 reps - Supine Pectoralis Stretch  - 1 x daily - 7 x weekly - 1 sets - 10 reps - Standing Isometric Shoulder External Rotation with Doorway  - 1 x daily - 7 x weekly - 2 sets - 10 reps - 5 seconds hold - Standing Isometric Shoulder Flexion with Doorway - Arm Bent  - 1 x daily - 7 x weekly - 2 sets - 10 reps - 5 seconds hold   ASSESSMENT:  CLINICAL IMPRESSION:    Pt. Still guards L shoulder close to body with IR occasionally.  Session focused on L shoulder A/AROM within protocol and progression of isometric holds/ex. Patient with general L shoulder soreness with STM and movement. Pt will benefit from continued PT services upon discharge to safely address deficits listed in patient problem list for decreased caregiver assistance and eventual return to PLOF.    OBJECTIVE IMPAIRMENTS: decreased ROM, decreased strength, hypomobility, impaired UE functional use, and pain.   ACTIVITY LIMITATIONS: carrying, bathing, dressing, reach over head, and hygiene/grooming  PARTICIPATION LIMITATIONS: meal prep, cleaning, laundry, driving, community activity, occupation, and yard work  PERSONAL FACTORS: Age and 1 comorbidity: adhesive capsulitis  are also affecting patient's functional outcome.   REHAB POTENTIAL: Good  CLINICAL DECISION MAKING: Stable/uncomplicated  EVALUATION COMPLEXITY:  Low   GOALS:   SHORT TERM GOALS: Target date: 11/16/2023  Pt will initiate PROM HEP after 10/27/2023 in order to improve L shoulder AROM while abiding by surgical protocols. Baseline: Initiate 2 weeks post-surgery (10/13/2023) Goal status: Goal met  2.  Pt will begin to wean off sling use after 11/10/2023 in order to restore functional mobility in her L shoulder while abiding by surgical protocols. Baseline: Initiate 4 weeks post-surgery (10/13/2023) Goal status: Goal met  3.  Pt will demonstrate 90 degrees of L shoulder flexion PROM and 30 degrees of L shoulder ER PROM by 11/02/2023 in order to achieve surgeon-specific PROM goals. Baseline: N/A Goal status: Goal met   LONG TERM GOALS: Target date: 12/13/2022  Pt will improve FOTO score to at least 63 in order to demonstrate improvement in pain and function with ADL's. Baseline: 41.  12/9: 42 Goal status: Not met  2.  Pt will achieve 120 degrees of L shoulder flexion PROM and 45 degrees of L shoulder ER PROM in order to achieve surgeon-specific PROM goals.  Baseline: 12/9: limited per protocol Goal status: Goal met  3.  Pt will report being able to function without sling by week 6-8 (12/12 - 12/26) in order improve function and use of her L UE. Baseline: N/A Goal status: Partially met  4.  Pt will improve L shoulder MMT scores to at least 4/5 to demonstrate improvement in L shoulder strength. Baseline: Not tested, to be assessed when appropriate going by protocol (post-op month 3 = January 31st) Goal status: Not met  5.  Pt will report <2/10 pain on NPS scale in order to show clinically significant improvement in pain at rest and with mobility. Baseline: 7/10 pain at rest.  12/9: 3-5/10 (slight increase in L shoulder pain this past weekend).   Goal status: Not met   PLAN:  PT FREQUENCY: 2x/week  PT DURATION: 8 weeks  PLANNED INTERVENTIONS: 57846- PT  Re-evaluation, 97110-Therapeutic exercises, 97530- Therapeutic  activity, [id number]- Neuromuscular re-education, 97140- Manual therapy, Joint mobilization, Cryotherapy, and Moist heat  PLAN FOR NEXT SESSION: Follow MD protocol/ CHECK GOALS and RECERT  Cammie Mcgee, PT, DPT # [phone] Physical Therapist - [place]  [facility] 12:40 PM,12/12/23

## 2023-12-15 ENCOUNTER — Ambulatory Visit: Payer: BC Managed Care – PPO | Attending: Hand Surgery | Admitting: Physical Therapy

## 2023-12-15 DIAGNOSIS — Z9889 Other specified postprocedural states: Secondary | ICD-10-CM | POA: Insufficient documentation

## 2023-12-15 DIAGNOSIS — M25512 Pain in left shoulder: Secondary | ICD-10-CM | POA: Insufficient documentation

## 2023-12-15 DIAGNOSIS — M25612 Stiffness of left shoulder, not elsewhere classified: Secondary | ICD-10-CM | POA: Insufficient documentation

## 2023-12-15 DIAGNOSIS — G8929 Other chronic pain: Secondary | ICD-10-CM | POA: Insufficient documentation

## 2023-12-15 NOTE — Therapy (Signed)
 OUTPATIENT PHYSICAL THERAPY SHOULDER TREATMENT/ RECERTIFICATION  Patient Name: Hannah Cross MRN: [id number] DOB:1956/01/01, 68 y.o., female Today's Date: 12/15/2023  END OF SESSION:  PT End of Session - 12/15/23 0715     Visit Number 15    Number of Visits 16    Date for PT Re-Evaluation 12/14/23    Activity Tolerance Patient limited by pain    Behavior During Therapy [facility] for tasks assessed/performed            Past Surgical History:  Procedure Laterality Date   ABDOMINAL HYSTERECTOMY     OOPHORECTOMY     Patient Active Problem List   Diagnosis Date Noted   Syncope 08/06/2019    PCP: Teresa Almarie Nam, NP  REFERRING PROVIDER: Sandralee Lonni Hamilton, MD  REFERRING DIAG: Incomplete rotator cuff tear or rupture of left shoulder, not specified as traumatic; adhesive capsulitis of left shoulder  THERAPY DIAG:  S/P arthroscopy of left shoulder  Chronic left shoulder pain  Stiffness of left shoulder, not elsewhere classified  S/P left rotator cuff repair  Rationale for Evaluation and Treatment: Rehabilitation  ONSET DATE: 10/13/2023    SUBJECTIVE:                                                                                                                                                                                      SUBJECTIVE STATEMENT: Pt reports to PT s/p L shoulder arthroscopy with rotator cuff repair, extensive debridement, tenodesis of long tendon of biceps, decompression of subacromial space with partial acromioplasty and distal claviculectomy (10/13/2023). Pt reports dealing with chronic bilateral shoulder pain for most of this year and received injections in both shoulders (L side in July); pt reported improvement in pain with her R shoulder but no improvement with her left, which is what resulted in her having the surgery. She says her pain is usually worse at night and she is having trouble sleeping because of the pain. Pt reports all her  sensation has returned since the numbing block wore off earlier this week.  Hand dominance: Right  PERTINENT HISTORY: Adhesive capsulitis, stress fx of L fibula (September 2024)  PAIN:  Are you having pain? Yes: NPRS scale: 7/10 now, 9/10 worst, 2/10 best Pain location: L shoulder Pain description: Sharp, burning Aggravating factors: Movement, having shoulder at rest without support Relieving factors: Ibuprofen  PRECAUTIONS: Shoulder  RED FLAGS: None   WEIGHT BEARING RESTRICTIONS: Yes NWB LUE for first 6 weeks after surgery  FALLS:  Has patient fallen in last 6 months? No  LIVING ENVIRONMENT: Lives with: lives with their spouse Lives in: House/apartment Stairs: Yes: External: 3 steps; none Has following equipment at  home: None  OCCUPATION: Safety Specialist  PLOF: Independent  PATIENT GOALS:Get normal ROM back with no pain, get back to cooking for her family  NEXT MD VISIT: TBD  OBJECTIVE:  Note: Objective measures were completed at Evaluation unless otherwise noted.  DIAGNOSTIC FINDINGS:  N/A  PATIENT SURVEYS:  FOTO 41/63  COGNITION: Overall cognitive status: Within functional limits for tasks assessed     SENSATION: Not tested (pt reports sensation has returned since numbing block wore off)  POSTURE: No significant postural limitations noted  UPPER EXTREMITY ROM:   Passive/Active ROM Right Eval (Active)/In sitting Left eval  Shoulder flexion 166 90 (abiding by protocol)  Shoulder extension 75   Shoulder abduction 160   Shoulder adduction    Shoulder internal rotation [facility]   Shoulder external rotation WFL ~10 degrees (abiding by protocol)  Elbow flexion [facility] WFL  Elbow extension [facility] WFL  Wrist flexion    Wrist extension    Wrist ulnar deviation    Wrist radial deviation    Wrist pronation    Wrist supination    (Blank rows = not tested)  UPPER EXTREMITY MMT:  MMT Right eval Left eval  Shoulder flexion 5   Shoulder extension 5    Shoulder abduction 5   Shoulder adduction    Shoulder internal rotation 5   Shoulder external rotation 4   Middle trapezius    Lower trapezius    Elbow flexion    Elbow extension    Wrist flexion    Wrist extension    Wrist ulnar deviation    Wrist radial deviation    Wrist pronation    Wrist supination    Grip strength (lbs) 36.9 lbs average   (Blank rows = not tested)  12/17: Grip strength: R 51#, L 32#.     TODAY'S TREATMENT:                                                                                                                                         DATE: 12/15/2023    Pt. Note arrived in error.  Pt. Did not show for appointment and PT left voicemail.  Pt. Has additional PT appts. Scheduled.    PATIENT EDUCATION: Education details: Role of PT, PT plan of care, icing Person educated: Patient Education method: Explanation and Demonstration Education comprehension: verbalized understanding  HOME EXERCISE PROGRAM: Access Code: [email or web address] URL: [email or web address] Date: 10/26/2023 Prepared by: Ozell Sero Exercises - Seated Scapular Retraction - 2 x daily - 7 x weekly - 1 sets - 10 reps - Seated Cervical Rotation AROM - 2 x daily - 7 x weekly - 1 sets - 10 reps - Seated Cervical Sidebending AROM - 2 x daily - 7 x weekly - 1 sets - 10 reps - Standing Circular Shoulder Pendulum Supported with Arm Bent - 2 x daily - 7 x weekly - 1 sets - 10 reps -  Seated Elbow Flexion AAROM - 2 x daily - 7 x weekly - 1 sets - 10 reps - Seated AAROM Elbow Supination/Pronation with Clasped Hands - 2 x daily - 7 x weekly - 1 sets - 10 reps  Access Code: [email or web address] URL: [email or web address] Date: 10/28/2023 Prepared by: Ozell Sero  Exercises - Seated Scapular Retraction  - 2 x daily - 7 x weekly - 1 sets - 10 reps - Seated Cervical Rotation AROM  - 2 x daily - 7 x weekly - 1 sets - 10 reps - Seated Cervical Sidebending AROM  - 2 x daily - 7 x weekly - 1 sets -  10 reps - Standing Circular Shoulder Pendulum Supported with Arm Bent  - 2 x daily - 7 x weekly - 1 sets - 10 reps - Seated Elbow Flexion AAROM  - 2 x daily - 7 x weekly - 1 sets - 10 reps - Seated Shoulder Flexion Self PROM  - 3 x daily - 7 x weekly - 1 sets - 10 reps - 5-10 second hold - Supine Shoulder External Rotation with Dowel  - 3 x daily - 7 x weekly - 1 sets - 10 reps - 5-10 seconds hold  Access Code: [email or web address] URL: [email or web address] Date: 11/16/2023 Prepared by: Ozell Sero  Exercises - Seated Cervical Sidebending AROM  - 2 x daily - 7 x weekly - 1 sets - 10 reps - Circular Shoulder Pendulum with Table Support  - 2 x daily - 7 x weekly - 1 sets - 10 reps - Seated Shoulder Flexion AAROM with Pulley Behind  - 2 x daily - 7 x weekly - 1 sets - 20 reps - Seated Shoulder Scaption AAROM with Pulley at Side  - 2 x daily - 7 x weekly - 1 sets - 20 reps - Seated Shoulder Abduction AAROM with Pulley Behind  - 2 x daily - 7 x weekly - 1 sets - 20 reps - Supine Shoulder External Rotation in 45 Degrees Abduction AAROM with Dowel  - 2 x daily - 7 x weekly - 1 sets - 10 reps  Access Code: [email or web address] URL: [email or web address] Date: 12/12/2023 Prepared by: Ozell Sero  Exercises - Seated Shoulder Flexion AAROM with Pulley Behind  - 2 x daily - 7 x weekly - 1 sets - 20 reps - Seated Shoulder Scaption AAROM with Pulley at Side  - 2 x daily - 7 x weekly - 1 sets - 20 reps - Seated Shoulder Abduction AAROM with Pulley Behind  - 2 x daily - 7 x weekly - 1 sets - 20 reps - Standing Shoulder Flexion Wall Walk  - 2 x daily - 7 x weekly - 2 sets - 10 reps - Sidelying Shoulder External Rotation  - 2 x daily - 7 x weekly - 2 sets - 10 reps - Supine Pectoralis Stretch  - 1 x daily - 7 x weekly - 1 sets - 10 reps - Standing Isometric Shoulder External Rotation with Doorway  - 1 x daily - 7 x weekly - 2 sets - 10 reps - 5 seconds hold - Standing Isometric Shoulder  Flexion with Doorway - Arm Bent  - 1 x daily - 7 x weekly - 2 sets - 10 reps - 5 seconds hold   ASSESSMENT:  CLINICAL IMPRESSION:    Pt. Note arrived in error.  Pt. Will be seen next week for additional PT tx. Sessions.   OBJECTIVE IMPAIRMENTS: decreased ROM, decreased strength, hypomobility, impaired UE  functional use, and pain.   ACTIVITY LIMITATIONS: carrying, bathing, dressing, reach over head, and hygiene/grooming  PARTICIPATION LIMITATIONS: meal prep, cleaning, laundry, driving, community activity, occupation, and yard work  PERSONAL FACTORS: Age and 1 comorbidity: adhesive capsulitis  are also affecting patient's functional outcome.   REHAB POTENTIAL: Good  CLINICAL DECISION MAKING: Stable/uncomplicated  EVALUATION COMPLEXITY: Low   GOALS:   SHORT TERM GOALS: Target date: 11/16/2023  Pt will initiate PROM HEP after 10/27/2023 in order to improve L shoulder AROM while abiding by surgical protocols. Baseline: Initiate 2 weeks post-surgery (10/13/2023) Goal status: Goal met  2.  Pt will begin to wean off sling use after 11/10/2023 in order to restore functional mobility in her L shoulder while abiding by surgical protocols. Baseline: Initiate 4 weeks post-surgery (10/13/2023) Goal status: Goal met  3.  Pt will demonstrate 90 degrees of L shoulder flexion PROM and 30 degrees of L shoulder ER PROM by 11/02/2023 in order to achieve surgeon-specific PROM goals. Baseline: N/A Goal status: Goal met   LONG TERM GOALS: Target date: 12/13/2022  Pt will improve FOTO score to at least 63 in order to demonstrate improvement in pain and function with ADL's. Baseline: 41.  12/9: 42 Goal status: Not met  2.  Pt will achieve 120 degrees of L shoulder flexion PROM and 45 degrees of L shoulder ER PROM in order to achieve surgeon-specific PROM goals.  Baseline: 12/9: limited per protocol Goal status: Goal met  3.  Pt will report being able to function without sling by week 6-8  (12/12 - 12/26) in order improve function and use of her L UE. Baseline: N/A Goal status: Partially met  4.  Pt will improve L shoulder MMT scores to at least 4/5 to demonstrate improvement in L shoulder strength. Baseline: Not tested, to be assessed when appropriate going by protocol (post-op month 3 = January 31st) Goal status: Not met  5.  Pt will report <2/10 pain on NPS scale in order to show clinically significant improvement in pain at rest and with mobility. Baseline: 7/10 pain at rest.  12/9: 3-5/10 (slight increase in L shoulder pain this past weekend).   Goal status: Not met   PLAN:  PT FREQUENCY: 2x/week  PT DURATION: 6 weeks  PLANNED INTERVENTIONS: 97164- PT Re-evaluation, 97110-Therapeutic exercises, 97530- Therapeutic activity, 97112- Neuromuscular re-education, 97140- Manual therapy, Joint mobilization, Cryotherapy, and Moist heat  PLAN FOR NEXT SESSION: Follow MD protocol/ progress L sh. AROM/ strength  Ozell JAYSON Sero, PT, DPT # [phone] Physical Therapist - [place]  [facility] 7:43 AM,12/15/23

## 2023-12-19 ENCOUNTER — Encounter: Payer: Self-pay | Admitting: Physical Therapy

## 2023-12-19 ENCOUNTER — Ambulatory Visit: Payer: BC Managed Care – PPO | Admitting: Physical Therapy

## 2023-12-19 DIAGNOSIS — M25612 Stiffness of left shoulder, not elsewhere classified: Secondary | ICD-10-CM | POA: Diagnosis present

## 2023-12-19 DIAGNOSIS — Z9889 Other specified postprocedural states: Secondary | ICD-10-CM

## 2023-12-19 DIAGNOSIS — M25512 Pain in left shoulder: Secondary | ICD-10-CM | POA: Diagnosis present

## 2023-12-19 DIAGNOSIS — G8929 Other chronic pain: Secondary | ICD-10-CM | POA: Diagnosis present

## 2023-12-19 NOTE — Therapy (Signed)
 OUTPATIENT PHYSICAL THERAPY SHOULDER TREATMENT/ RECERTIFICATION  Patient Name: Hannah Cross MRN: [id number] DOB:[birth year], 68 y.o., female Today's Date: 12/19/2023  END OF SESSION:  PT End of Session - 12/19/23 0904     Visit Number 16    Number of Visits 24    Date for PT Re-Evaluation 01/16/24    PT Start Time 0902    PT Stop Time 0947    PT Time Calculation (min) 45 min    Activity Tolerance Patient limited by pain    Behavior During Therapy [facility] for tasks assessed/performed            Past Surgical History:  Procedure Laterality Date   ABDOMINAL HYSTERECTOMY     OOPHORECTOMY     Patient Active Problem List   Diagnosis Date Noted   Syncope 08/06/2019    PCP: Teresa Almarie Nam, NP  REFERRING PROVIDER: Sandralee Lonni Hamilton, MD  REFERRING DIAG: Incomplete rotator cuff tear or rupture of left shoulder, not specified as traumatic; adhesive capsulitis of left shoulder  THERAPY DIAG:  S/P arthroscopy of left shoulder  Chronic left shoulder pain  Stiffness of left shoulder, not elsewhere classified  S/P left rotator cuff repair  Rationale for Evaluation and Treatment: Rehabilitation  ONSET DATE: 10/13/2023    SUBJECTIVE:                                                                                                                                                                                      SUBJECTIVE STATEMENT: Pt reports to PT s/p L shoulder arthroscopy with rotator cuff repair, extensive debridement, tenodesis of long tendon of biceps, decompression of subacromial space with partial acromioplasty and distal claviculectomy (10/13/2023). Pt reports dealing with chronic bilateral shoulder pain for most of this year and received injections in both shoulders (L side in July); pt reported improvement in pain with her R shoulder but no improvement with her left, which is what resulted in her having the surgery. She says her pain is usually worse  at night and she is having trouble sleeping because of the pain. Pt reports all her sensation has returned since the numbing block wore off earlier this week.  Hand dominance: Right  PERTINENT HISTORY: Adhesive capsulitis, stress fx of L fibula (September 2024)  PAIN:  Are you having pain? Yes: NPRS scale: 7/10 now, 9/10 worst, 2/10 best Pain location: L shoulder Pain description: Sharp, burning Aggravating factors: Movement, having shoulder at rest without support Relieving factors: Ibuprofen  PRECAUTIONS: Shoulder  RED FLAGS: None   WEIGHT BEARING RESTRICTIONS: Yes NWB LUE for first 6 weeks after surgery  FALLS:  Has patient fallen in last 6 months?  No  LIVING ENVIRONMENT: Lives with: lives with their spouse Lives in: House/apartment Stairs: Yes: External: 3 steps; none Has following equipment at home: None  OCCUPATION: Safety Specialist  PLOF: Independent  PATIENT GOALS:Get normal ROM back with no pain, get back to cooking for her family  NEXT MD VISIT: TBD  OBJECTIVE:  Note: Objective measures were completed at Evaluation unless otherwise noted.  DIAGNOSTIC FINDINGS:  N/A  PATIENT SURVEYS:  FOTO 41/63  COGNITION: Overall cognitive status: Within functional limits for tasks assessed     SENSATION: Not tested (pt reports sensation has returned since numbing block wore off)  POSTURE: No significant postural limitations noted  UPPER EXTREMITY ROM:   Passive/Active ROM Right Eval (Active)/In sitting Left eval  Shoulder flexion 166 90 (abiding by protocol)  Shoulder extension 75   Shoulder abduction 160   Shoulder adduction    Shoulder internal rotation [facility]   Shoulder external rotation WFL ~10 degrees (abiding by protocol)  Elbow flexion [facility] WFL  Elbow extension [facility] WFL  Wrist flexion    Wrist extension    Wrist ulnar deviation    Wrist radial deviation    Wrist pronation    Wrist supination    (Blank rows = not tested)  UPPER EXTREMITY  MMT:  MMT Right eval Left eval  Shoulder flexion 5   Shoulder extension 5   Shoulder abduction 5   Shoulder adduction    Shoulder internal rotation 5   Shoulder external rotation 4   Middle trapezius    Lower trapezius    Elbow flexion    Elbow extension    Wrist flexion    Wrist extension    Wrist ulnar deviation    Wrist radial deviation    Wrist pronation    Wrist supination    Grip strength (lbs) 36.9 lbs average   (Blank rows = not tested)  12/17: Grip strength: R 51#, L 32#.     TODAY'S TREATMENT:                                                                                                                                         DATE: 12/19/2023    Subjective:  Pt. returns to MD in 1 week (12/28/23).  Pt. C/o 2-3/10 L shoulder pain during L shoulder flexion/ reaching.  Pt. Reports compliance with HEP/ icing.     There.ex.:  Seated shoulder pulley ex.: flexion/ abduction 20x.  ABD: 143 deg.     Standing L shoulder A/AROM with cane (flexion/ abduction/ ER/ IR) with cuing for isometric holds/ eccentric control (PT assist and use of cane for safety)- 10x each. Banker.     Standing wall ladder: sh. Scaption/abduction 5x each.  Difficulty with abduction.    Nautilus: seated lat. Pull down with palms up/down 40# (with shoulder flexion static holds)- 20x.  Pt. Reports 4/10 L shoulder discomfort.  Supine B shoulder  flexion/ horizontal abduction/ adduction 20x.     Supine L shoulder isometrics (flexion/ abduction/ IR/ ER)- 10x each with 5-10 sec. Holds.     STM to L UT/shoulder/scapular musculature 5 min.  Generalized soreness in L shoulder.     Reassessment of L shoulder AAROM in seated posture (flexion/ abduction).     Reviewed HEP   Patient to ice at home   PATIENT EDUCATION: Education details: Role of PT, PT plan of care, icing Person educated: Patient Education method: Explanation and Demonstration Education comprehension: verbalized  understanding  HOME EXERCISE PROGRAM: Access Code: [email or web address] URL: [email or web address] Date: 10/26/2023 Prepared by: Ozell Sero Exercises - Seated Scapular Retraction - 2 x daily - 7 x weekly - 1 sets - 10 reps - Seated Cervical Rotation AROM - 2 x daily - 7 x weekly - 1 sets - 10 reps - Seated Cervical Sidebending AROM - 2 x daily - 7 x weekly - 1 sets - 10 reps - Standing Circular Shoulder Pendulum Supported with Arm Bent - 2 x daily - 7 x weekly - 1 sets - 10 reps - Seated Elbow Flexion AAROM - 2 x daily - 7 x weekly - 1 sets - 10 reps - Seated AAROM Elbow Supination/Pronation with Clasped Hands - 2 x daily - 7 x weekly - 1 sets - 10 reps  Access Code: [email or web address] URL: [email or web address] Date: 10/28/2023 Prepared by: Ozell Sero  Exercises - Seated Scapular Retraction  - 2 x daily - 7 x weekly - 1 sets - 10 reps - Seated Cervical Rotation AROM  - 2 x daily - 7 x weekly - 1 sets - 10 reps - Seated Cervical Sidebending AROM  - 2 x daily - 7 x weekly - 1 sets - 10 reps - Standing Circular Shoulder Pendulum Supported with Arm Bent  - 2 x daily - 7 x weekly - 1 sets - 10 reps - Seated Elbow Flexion AAROM  - 2 x daily - 7 x weekly - 1 sets - 10 reps - Seated Shoulder Flexion Self PROM  - 3 x daily - 7 x weekly - 1 sets - 10 reps - 5-10 second hold - Supine Shoulder External Rotation with Dowel  - 3 x daily - 7 x weekly - 1 sets - 10 reps - 5-10 seconds hold  Access Code: [email or web address] URL: [email or web address] Date: 11/16/2023 Prepared by: Ozell Sero  Exercises - Seated Cervical Sidebending AROM  - 2 x daily - 7 x weekly - 1 sets - 10 reps - Circular Shoulder Pendulum with Table Support  - 2 x daily - 7 x weekly - 1 sets - 10 reps - Seated Shoulder Flexion AAROM with Pulley Behind  - 2 x daily - 7 x weekly - 1 sets - 20 reps - Seated Shoulder Scaption AAROM with Pulley at Side  - 2 x daily - 7 x weekly - 1 sets - 20 reps - Seated Shoulder  Abduction AAROM with Pulley Behind  - 2 x daily - 7 x weekly - 1 sets - 20 reps - Supine Shoulder External Rotation in 45 Degrees Abduction AAROM with Dowel  - 2 x daily - 7 x weekly - 1 sets - 10 reps  Access Code: [email or web address] URL: [email or web address] Date: 12/12/2023 Prepared by: Ozell Sero  Exercises - Seated Shoulder Flexion AAROM with Pulley Behind  - 2 x daily - 7 x weekly - 1 sets - 20 reps - Seated Shoulder Scaption AAROM with Pulley at Side  -  2 x daily - 7 x weekly - 1 sets - 20 reps - Seated Shoulder Abduction AAROM with Pulley Behind  - 2 x daily - 7 x weekly - 1 sets - 20 reps - Standing Shoulder Flexion Wall Walk  - 2 x daily - 7 x weekly - 2 sets - 10 reps - Sidelying Shoulder External Rotation  - 2 x daily - 7 x weekly - 2 sets - 10 reps - Supine Pectoralis Stretch  - 1 x daily - 7 x weekly - 1 sets - 10 reps - Standing Isometric Shoulder External Rotation with Doorway  - 1 x daily - 7 x weekly - 2 sets - 10 reps - 5 seconds hold - Standing Isometric Shoulder Flexion with Doorway - Arm Bent  - 1 x daily - 7 x weekly - 2 sets - 10 reps - 5 seconds hold   ASSESSMENT:  CLINICAL IMPRESSION:    PT tx. session focused on L shoulder A/AROM within protocol and progression of isometric holds/ex.  See updated ROM and goal reassessment. Pt. with general L shoulder soreness with STM and movement.  Pt will benefit from continued PT services to increase L shoulder ROM/ stability to improve pain-free mobility.    OBJECTIVE IMPAIRMENTS: decreased ROM, decreased strength, hypomobility, impaired UE functional use, and pain.   ACTIVITY LIMITATIONS: carrying, bathing, dressing, reach over head, and hygiene/grooming  PARTICIPATION LIMITATIONS: meal prep, cleaning, laundry, driving, community activity, occupation, and yard work  PERSONAL FACTORS: Age and 1 comorbidity: adhesive capsulitis  are also affecting patient's functional outcome.   REHAB POTENTIAL: Good  CLINICAL  DECISION MAKING: Stable/uncomplicated  EVALUATION COMPLEXITY: Low   GOALS:  LONG TERM GOALS: Target date: 01/16/2024  Pt will improve FOTO score to at least 63 in order to demonstrate improvement in pain and function with ADL's. Baseline: 41.  12/9: 42 Goal status: Not met  2.  Pt will improve L shoulder MMT scores to at least 4/5 to demonstrate improvement in L shoulder strength. Baseline: Not tested, to be assessed when appropriate going by protocol (post-op month 3 = January 31st) Goal status: Not met  3.  Pt will report <2/10 pain on NPS scale in order to show clinically significant improvement in pain at rest and with mobility. Baseline: 7/10 pain at rest.  12/9: 3-5/10 (slight increase in L shoulder pain this past weekend).   Goal status: Not met   PLAN:  PT FREQUENCY: 2x/week  PT DURATION: 4 weeks  PLANNED INTERVENTIONS: 97164- PT Re-evaluation, 97110-Therapeutic exercises, 97530- Therapeutic activity, 97112- Neuromuscular re-education, 97140- Manual therapy, Joint mobilization, Cryotherapy, and Moist heat  PLAN FOR NEXT SESSION: Follow MD protocol/ progress L sh. AROM/ strength  Ozell JAYSON Sero, PT, DPT # [phone] Physical Therapist - [place]  [facility] 6:07 PM,12/19/23

## 2023-12-21 ENCOUNTER — Encounter: Payer: BC Managed Care – PPO | Admitting: Physical Therapy

## 2023-12-26 ENCOUNTER — Ambulatory Visit: Payer: BC Managed Care – PPO | Admitting: Physical Therapy

## 2023-12-26 DIAGNOSIS — G8929 Other chronic pain: Secondary | ICD-10-CM

## 2023-12-26 DIAGNOSIS — M25612 Stiffness of left shoulder, not elsewhere classified: Secondary | ICD-10-CM

## 2023-12-26 DIAGNOSIS — Z9889 Other specified postprocedural states: Secondary | ICD-10-CM | POA: Diagnosis not present

## 2023-12-26 NOTE — Therapy (Signed)
 OUTPATIENT PHYSICAL THERAPY SHOULDER TREATMENT  Patient Name: Hannah Cross MRN: [id number] DOB:19-Sep-1956, 68 y.o., female Today's Date: 12/26/2023  END OF SESSION:  PT End of Session - 12/26/23 0731     Visit Number 17    Number of Visits 24    Date for PT Re-Evaluation 01/16/24    PT Start Time 0731    PT Stop Time 0818    PT Time Calculation (min) 47 min    Activity Tolerance Patient limited by pain    Behavior During Therapy [facility] for tasks assessed/performed            Past Surgical History:  Procedure Laterality Date   ABDOMINAL HYSTERECTOMY     OOPHORECTOMY     Patient Active Problem List   Diagnosis Date Noted   Syncope 08/06/2019    PCP: Teresa Almarie Nam, NP  REFERRING PROVIDER: Sandralee Lonni Hamilton, MD  REFERRING DIAG: Incomplete rotator cuff tear or rupture of left shoulder, not specified as traumatic; adhesive capsulitis of left shoulder  THERAPY DIAG:  S/P arthroscopy of left shoulder  Chronic left shoulder pain  Stiffness of left shoulder, not elsewhere classified  S/P left rotator cuff repair  Rationale for Evaluation and Treatment: Rehabilitation  ONSET DATE: 10/13/2023    SUBJECTIVE:                                                                                                                                                                                      SUBJECTIVE STATEMENT: Pt reports to PT s/p L shoulder arthroscopy with rotator cuff repair, extensive debridement, tenodesis of long tendon of biceps, decompression of subacromial space with partial acromioplasty and distal claviculectomy (10/13/2023). Pt reports dealing with chronic bilateral shoulder pain for most of this year and received injections in both shoulders (L side in July); pt reported improvement in pain with her R shoulder but no improvement with her left, which is what resulted in her having the surgery. She says her pain is usually worse at night and she  is having trouble sleeping because of the pain. Pt reports all her sensation has returned since the numbing block wore off earlier this week.  Hand dominance: Right  PERTINENT HISTORY: Adhesive capsulitis, stress fx of L fibula (September 2024)  PAIN:  Are you having pain? Yes: NPRS scale: 7/10 now, 9/10 worst, 2/10 best Pain location: L shoulder Pain description: Sharp, burning Aggravating factors: Movement, having shoulder at rest without support Relieving factors: Ibuprofen  PRECAUTIONS: Shoulder  RED FLAGS: None   WEIGHT BEARING RESTRICTIONS: Yes NWB LUE for first 6 weeks after surgery  FALLS:  Has patient fallen in last 6 months? No  LIVING ENVIRONMENT: Lives with: lives with their spouse Lives in: House/apartment Stairs: Yes: External: 3 steps; none Has following equipment at home: None  OCCUPATION: Safety Specialist  PLOF: Independent  PATIENT GOALS:Get normal ROM back with no pain, get back to cooking for her family  NEXT MD VISIT: TBD  OBJECTIVE:  Note: Objective measures were completed at Evaluation unless otherwise noted.  DIAGNOSTIC FINDINGS:  N/A  PATIENT SURVEYS:  FOTO 41/63  COGNITION: Overall cognitive status: Within functional limits for tasks assessed     SENSATION: Not tested (pt reports sensation has returned since numbing block wore off)  POSTURE: No significant postural limitations noted  UPPER EXTREMITY ROM:   Passive/Active ROM Right Eval (Active)/In sitting Left eval  Shoulder flexion 166 90 (abiding by protocol)  Shoulder extension 75   Shoulder abduction 160   Shoulder adduction    Shoulder internal rotation [facility]   Shoulder external rotation WFL ~10 degrees (abiding by protocol)  Elbow flexion [facility] WFL  Elbow extension [facility] WFL  Wrist flexion    Wrist extension    Wrist ulnar deviation    Wrist radial deviation    Wrist pronation    Wrist supination    (Blank rows = not tested)  UPPER EXTREMITY MMT:  MMT  Right eval Left eval  Shoulder flexion 5   Shoulder extension 5   Shoulder abduction 5   Shoulder adduction    Shoulder internal rotation 5   Shoulder external rotation 4   Middle trapezius    Lower trapezius    Elbow flexion    Elbow extension    Wrist flexion    Wrist extension    Wrist ulnar deviation    Wrist radial deviation    Wrist pronation    Wrist supination    Grip strength (lbs) 36.9 lbs average   (Blank rows = not tested)  12/17: Grip strength: R 51#, L 32#.     TODAY'S TREATMENT:                                                                                                                                         DATE: 12/26/2023    Subjective:  Pt. returns to MD this Wednesday (12/28/23).  Pt. C/o 4/10 L shoulder pain during L shoulder flexion/ reaching.  Pt. Reports compliance with HEP/ icing.     There.ex.:  Seated shoulder pulley ex.: flexion/ abduction 20x.  Flex: 145 deg.,  ABD: 150 deg.     Nautilus: seated lat. Pull down with palms down 40# (with shoulder flexion static holds)- 15x2/ standing tricep extension 20#- 15x2.  Standing scapular retraction 30#  Pt. Reports 4/10 L shoulder discomfort.    Standing L shoulder A/AROM with cane (flexion/ abduction/ chest press) with cuing for isometric holds/ eccentric control (PT assist and use of cane for safety)- 10x each.  Good AROM/ no compensatory movement patterns.  Slight increase  in L shoulder pain symptoms reported.     Supine B shoulder stretches:  flexion/ horizontal abduction 10x.     Supine L shoulder isometrics (flexion at 110 deg./ abduction/ IR/ ER)- 10x each with 5-10 sec. Holds.      Reviewed HEP   Patient to ice at home   PATIENT EDUCATION: Education details: Role of PT, PT plan of care, icing Person educated: Patient Education method: Explanation and Demonstration Education comprehension: verbalized understanding  HOME EXERCISE PROGRAM: Access Code: [email or web address] URL:  [email or web address] Date: 10/26/2023 Prepared by: Ozell Sero Exercises - Seated Scapular Retraction - 2 x daily - 7 x weekly - 1 sets - 10 reps - Seated Cervical Rotation AROM - 2 x daily - 7 x weekly - 1 sets - 10 reps - Seated Cervical Sidebending AROM - 2 x daily - 7 x weekly - 1 sets - 10 reps - Standing Circular Shoulder Pendulum Supported with Arm Bent - 2 x daily - 7 x weekly - 1 sets - 10 reps - Seated Elbow Flexion AAROM - 2 x daily - 7 x weekly - 1 sets - 10 reps - Seated AAROM Elbow Supination/Pronation with Clasped Hands - 2 x daily - 7 x weekly - 1 sets - 10 reps  Access Code: [email or web address] URL: [email or web address] Date: 10/28/2023 Prepared by: Ozell Sero  Exercises - Seated Scapular Retraction  - 2 x daily - 7 x weekly - 1 sets - 10 reps - Seated Cervical Rotation AROM  - 2 x daily - 7 x weekly - 1 sets - 10 reps - Seated Cervical Sidebending AROM  - 2 x daily - 7 x weekly - 1 sets - 10 reps - Standing Circular Shoulder Pendulum Supported with Arm Bent  - 2 x daily - 7 x weekly - 1 sets - 10 reps - Seated Elbow Flexion AAROM  - 2 x daily - 7 x weekly - 1 sets - 10 reps - Seated Shoulder Flexion Self PROM  - 3 x daily - 7 x weekly - 1 sets - 10 reps - 5-10 second hold - Supine Shoulder External Rotation with Dowel  - 3 x daily - 7 x weekly - 1 sets - 10 reps - 5-10 seconds hold  Access Code: [email or web address] URL: [email or web address] Date: 11/16/2023 Prepared by: Ozell Sero  Exercises - Seated Cervical Sidebending AROM  - 2 x daily - 7 x weekly - 1 sets - 10 reps - Circular Shoulder Pendulum with Table Support  - 2 x daily - 7 x weekly - 1 sets - 10 reps - Seated Shoulder Flexion AAROM with Pulley Behind  - 2 x daily - 7 x weekly - 1 sets - 20 reps - Seated Shoulder Scaption AAROM with Pulley at Side  - 2 x daily - 7 x weekly - 1 sets - 20 reps - Seated Shoulder Abduction AAROM with Pulley Behind  - 2 x daily - 7 x weekly - 1 sets - 20  reps - Supine Shoulder External Rotation in 45 Degrees Abduction AAROM with Dowel  - 2 x daily - 7 x weekly - 1 sets - 10 reps  Access Code: [email or web address] URL: [email or web address] Date: 12/12/2023 Prepared by: Ozell Sero  Exercises - Seated Shoulder Flexion AAROM with Pulley Behind  - 2 x daily - 7 x weekly - 1 sets - 20 reps - Seated Shoulder Scaption AAROM with Pulley at Side  - 2 x daily - 7 x weekly - 1 sets - 20 reps -  Seated Shoulder Abduction AAROM with Pulley Behind  - 2 x daily - 7 x weekly - 1 sets - 20 reps - Standing Shoulder Flexion Wall Walk  - 2 x daily - 7 x weekly - 2 sets - 10 reps - Sidelying Shoulder External Rotation  - 2 x daily - 7 x weekly - 2 sets - 10 reps - Supine Pectoralis Stretch  - 1 x daily - 7 x weekly - 1 sets - 10 reps - Standing Isometric Shoulder External Rotation with Doorway  - 1 x daily - 7 x weekly - 2 sets - 10 reps - 5 seconds hold - Standing Isometric Shoulder Flexion with Doorway - Arm Bent  - 1 x daily - 7 x weekly - 2 sets - 10 reps - 5 seconds hold.  ASSESSMENT:  CLINICAL IMPRESSION:    PT tx. session focused on L shoulder A/AROM within protocol and progression of isometric holds/ex.  No compensatory movement patterns with use of Nautilus UE ex.  Pt. with general L shoulder soreness with STM and movement.  Pt will benefit from continued PT services to increase L shoulder ROM/ stability to improve pain-free mobility.    OBJECTIVE IMPAIRMENTS: decreased ROM, decreased strength, hypomobility, impaired UE functional use, and pain.   ACTIVITY LIMITATIONS: carrying, bathing, dressing, reach over head, and hygiene/grooming  PARTICIPATION LIMITATIONS: meal prep, cleaning, laundry, driving, community activity, occupation, and yard work  PERSONAL FACTORS: Age and 1 comorbidity: adhesive capsulitis  are also affecting patient's functional outcome.   REHAB POTENTIAL: Good  CLINICAL DECISION MAKING: Stable/uncomplicated  EVALUATION  COMPLEXITY: Low  GOALS:  LONG TERM GOALS: Target date: 01/16/2024  Pt will improve FOTO score to at least 63 in order to demonstrate improvement in pain and function with ADL's. Baseline: 41.  12/9: 42 Goal status: Not met  2.  Pt will improve L shoulder MMT scores to at least 4/5 to demonstrate improvement in L shoulder strength. Baseline: Not tested, to be assessed when appropriate going by protocol (post-op month 3 = January 31st) Goal status: Not met  3.  Pt will report <2/10 pain on NPS scale in order to show clinically significant improvement in pain at rest and with mobility. Baseline: 7/10 pain at rest.  12/9: 3-5/10 (slight increase in L shoulder pain this past weekend).   Goal status: Not met  PLAN:  PT FREQUENCY: 2x/week  PT DURATION: 4 weeks  PLANNED INTERVENTIONS: 97164- PT Re-evaluation, 97110-Therapeutic exercises, 97530- Therapeutic activity, 97112- Neuromuscular re-education, 97140- Manual therapy, Joint mobilization, Cryotherapy, and Moist heat  PLAN FOR NEXT SESSION: Follow MD protocol/ Send MD progress note (reassess MMT).    Ozell JAYSON Sero, PT, DPT # [phone] Physical Therapist - [place]  [facility] 8:35 AM,12/26/23

## 2023-12-28 ENCOUNTER — Encounter: Payer: Self-pay | Admitting: Physical Therapy

## 2023-12-28 ENCOUNTER — Ambulatory Visit: Payer: BC Managed Care – PPO | Admitting: Physical Therapy

## 2023-12-28 DIAGNOSIS — M25612 Stiffness of left shoulder, not elsewhere classified: Secondary | ICD-10-CM

## 2023-12-28 DIAGNOSIS — G8929 Other chronic pain: Secondary | ICD-10-CM

## 2023-12-28 DIAGNOSIS — Z9889 Other specified postprocedural states: Secondary | ICD-10-CM | POA: Diagnosis not present

## 2023-12-28 NOTE — Therapy (Signed)
 OUTPATIENT PHYSICAL THERAPY SHOULDER TREATMENT  Patient Name: Hannah Cross MRN: [id number] DOB:05/18/1956, 68 y.o., female Today's Date: 12/28/2023  END OF SESSION:  PT End of Session - 12/28/23 0821     Visit Number 18    Number of Visits 24    Date for PT Re-Evaluation 01/16/24    PT Start Time 0816    PT Stop Time 0903    PT Time Calculation (min) 47 min    Activity Tolerance Patient limited by pain    Behavior During Therapy [facility] for tasks assessed/performed            Past Surgical History:  Procedure Laterality Date   ABDOMINAL HYSTERECTOMY     OOPHORECTOMY     Patient Active Problem List   Diagnosis Date Noted   Syncope 08/06/2019    PCP: Claretta Croft, NP  REFERRING PROVIDER: Pam Bode, MD  REFERRING DIAG: Incomplete rotator cuff tear or rupture of left shoulder, not specified as traumatic; adhesive capsulitis of left shoulder  THERAPY DIAG:  S/P arthroscopy of left shoulder  Chronic left shoulder pain  Stiffness of left shoulder, not elsewhere classified  S/P left rotator cuff repair  Rationale for Evaluation and Treatment: Rehabilitation  ONSET DATE: 10/13/2023    SUBJECTIVE:                                                                                                                                                                                      SUBJECTIVE STATEMENT: Pt reports to PT s/p L shoulder arthroscopy with rotator cuff repair, extensive debridement, tenodesis of long tendon of biceps, decompression of subacromial space with partial acromioplasty and distal claviculectomy (10/13/2023). Pt reports dealing with chronic bilateral shoulder pain for most of this year and received injections in both shoulders (L side in July); pt reported improvement in pain with her R shoulder but no improvement with her left, which is what resulted in her having the surgery. She says her pain is usually worse at night and she  is having trouble sleeping because of the pain. Pt reports all her sensation has returned since the numbing block wore off earlier this week.  Hand dominance: Right  PERTINENT HISTORY: Adhesive capsulitis, stress fx of L fibula (September 2024)  PAIN:  Are you having pain? Yes: NPRS scale: 7/10 now, 9/10 worst, 2/10 best Pain location: L shoulder Pain description: Sharp, burning Aggravating factors: Movement, having shoulder at rest without support Relieving factors: Ibuprofen  PRECAUTIONS: Shoulder  RED FLAGS: None   WEIGHT BEARING RESTRICTIONS: Yes NWB LUE for first 6 weeks after surgery  FALLS:  Has patient fallen in last 6 months? No  LIVING ENVIRONMENT: Lives with: lives with their spouse Lives in: House/apartment Stairs: Yes: External: 3 steps; none Has following equipment at home: None  OCCUPATION: Safety Specialist  PLOF: Independent  PATIENT GOALS:Get normal ROM back with no pain, get back to cooking for her family  NEXT MD VISIT: TBD  OBJECTIVE:  Note: Objective measures were completed at Evaluation unless otherwise noted.  DIAGNOSTIC FINDINGS:  N/A  PATIENT SURVEYS:  FOTO 41/63  COGNITION: Overall cognitive status: Within functional limits for tasks assessed     SENSATION: Not tested (pt reports sensation has returned since numbing block wore off)  POSTURE: No significant postural limitations noted  UPPER EXTREMITY ROM:   Passive/Active ROM Right Eval (Active)/In sitting Left eval  Shoulder flexion 166 90 (abiding by protocol)  Shoulder extension 75   Shoulder abduction 160   Shoulder adduction    Shoulder internal rotation [facility]   Shoulder external rotation WFL ~10 degrees (abiding by protocol)  Elbow flexion [facility] WFL  Elbow extension [facility] WFL  Wrist flexion    Wrist extension    Wrist ulnar deviation    Wrist radial deviation    Wrist pronation    Wrist supination    (Blank rows = not tested)  UPPER EXTREMITY MMT:  MMT  Right eval Left eval  Shoulder flexion 5   Shoulder extension 5   Shoulder abduction 5   Shoulder adduction    Shoulder internal rotation 5   Shoulder external rotation 4   Middle trapezius    Lower trapezius    Elbow flexion    Elbow extension    Wrist flexion    Wrist extension    Wrist ulnar deviation    Wrist radial deviation    Wrist pronation    Wrist supination    Grip strength (lbs) 36.9 lbs average   (Blank rows = not tested)  12/17: Grip strength: R 51#, L 32#.     TODAY'S TREATMENT:                                                                                                                                         DATE: 12/28/2023    Subjective:  Pt. returns to MD today (12/28/23).  Pt. C/o 4/10 L shoulder pain this morning and states her shoulder does not like the cold weather.  Pt. Reports compliance with HEP/ icing.  Pt. Not able to sleep on side due to increase shoulder discomfort.      There.ex.:  Seated shoulder pulley ex. AAROM: flexion/ abduction 20x.  Flex: 146 deg.,  ABD: 150 deg. (ABD 124 deg. AROM).        Seated B shoulder A/AROM with wand (mirror feedback): 10x flexion/ 10x chest press/ 10x shoulder abduction/ 10x ER.     Nautilus: seated lat. Pull down with palms down 40# (with shoulder flexion static holds)- 15x2/ standing tricep extension 20#- 15x2.  Standing  scapular retraction 30#  Pt. Reports 4/10 L shoulder discomfort.    Supine L shoulder stretches:  flexion/ abduction/ ER 10x.  Pain limited with ER.      Reviewed HEP   Patient to ice at home   PATIENT EDUCATION: Education details: Role of PT, PT plan of care, icing Person educated: Patient Education method: Explanation and Demonstration Education comprehension: verbalized understanding  HOME EXERCISE PROGRAM: Access Code: [email or web address] URL: [email or web address] Date: 10/26/2023 Prepared by: Hazeline Lister Exercises - Seated Scapular Retraction - 2 x daily - 7 x weekly - 1  sets - 10 reps - Seated Cervical Rotation AROM - 2 x daily - 7 x weekly - 1 sets - 10 reps - Seated Cervical Sidebending AROM - 2 x daily - 7 x weekly - 1 sets - 10 reps - Standing Circular Shoulder Pendulum Supported with Arm Bent - 2 x daily - 7 x weekly - 1 sets - 10 reps - Seated Elbow Flexion AAROM - 2 x daily - 7 x weekly - 1 sets - 10 reps - Seated AAROM Elbow Supination/Pronation with Clasped Hands - 2 x daily - 7 x weekly - 1 sets - 10 reps  Access Code: [email or web address] URL: [email or web address] Date: 10/28/2023 Prepared by: Hazeline Lister  Exercises - Seated Scapular Retraction  - 2 x daily - 7 x weekly - 1 sets - 10 reps - Seated Cervical Rotation AROM  - 2 x daily - 7 x weekly - 1 sets - 10 reps - Seated Cervical Sidebending AROM  - 2 x daily - 7 x weekly - 1 sets - 10 reps - Standing Circular Shoulder Pendulum Supported with Arm Bent  - 2 x daily - 7 x weekly - 1 sets - 10 reps - Seated Elbow Flexion AAROM  - 2 x daily - 7 x weekly - 1 sets - 10 reps - Seated Shoulder Flexion Self PROM  - 3 x daily - 7 x weekly - 1 sets - 10 reps - 5-10 second hold - Supine Shoulder External Rotation with Dowel  - 3 x daily - 7 x weekly - 1 sets - 10 reps - 5-10 seconds hold  Access Code: [email or web address] URL: [email or web address] Date: 11/16/2023 Prepared by: Hazeline Lister  Exercises - Seated Cervical Sidebending AROM  - 2 x daily - 7 x weekly - 1 sets - 10 reps - Circular Shoulder Pendulum with Table Support  - 2 x daily - 7 x weekly - 1 sets - 10 reps - Seated Shoulder Flexion AAROM with Pulley Behind  - 2 x daily - 7 x weekly - 1 sets - 20 reps - Seated Shoulder Scaption AAROM with Pulley at Side  - 2 x daily - 7 x weekly - 1 sets - 20 reps - Seated Shoulder Abduction AAROM with Pulley Behind  - 2 x daily - 7 x weekly - 1 sets - 20 reps - Supine Shoulder External Rotation in 45 Degrees Abduction AAROM with Dowel  - 2 x daily - 7 x weekly - 1 sets - 10 reps  Access Code:  [email or web address] URL: [email or web address] Date: 12/12/2023 Prepared by: Hazeline Lister  Exercises - Seated Shoulder Flexion AAROM with Pulley Behind  - 2 x daily - 7 x weekly - 1 sets - 20 reps - Seated Shoulder Scaption AAROM with Pulley at Side  - 2 x daily - 7 x weekly - 1 sets - 20 reps - Seated Shoulder Abduction AAROM with Pulley Behind  - 2 x daily -  7 x weekly - 1 sets - 20 reps - Standing Shoulder Flexion Wall Walk  - 2 x daily - 7 x weekly - 2 sets - 10 reps - Sidelying Shoulder External Rotation  - 2 x daily - 7 x weekly - 2 sets - 10 reps - Supine Pectoralis Stretch  - 1 x daily - 7 x weekly - 1 sets - 10 reps - Standing Isometric Shoulder External Rotation with Doorway  - 1 x daily - 7 x weekly - 2 sets - 10 reps - 5 seconds hold - Standing Isometric Shoulder Flexion with Doorway - Arm Bent  - 1 x daily - 7 x weekly - 2 sets - 10 reps - 5 seconds hold.  ASSESSMENT:  CLINICAL IMPRESSION:    PT tx. session focused on L shoulder A/AROM within protocol and progression of isometric holds/ex.  No compensatory movement patterns with use of Nautilus UE ex.  Pt. with general L shoulder soreness with STM and movement.  Pt. Remains limited with L shoulder ER and steady progress with L shoulder flexion/ abduction AROM.  Pt. Returns to MD today and will report back to PT if any change in POC.   Pt will benefit from continued PT services to increase L shoulder ROM/ stability to improve pain-free mobility.    OBJECTIVE IMPAIRMENTS: decreased ROM, decreased strength, hypomobility, impaired UE functional use, and pain.   ACTIVITY LIMITATIONS: carrying, bathing, dressing, reach over head, and hygiene/grooming  PARTICIPATION LIMITATIONS: meal prep, cleaning, laundry, driving, community activity, occupation, and yard work  PERSONAL FACTORS: Age and 1 comorbidity: adhesive capsulitis  are also affecting patient's functional outcome.   REHAB POTENTIAL: Good  CLINICAL DECISION MAKING:  Stable/uncomplicated  EVALUATION COMPLEXITY: Low  GOALS:  LONG TERM GOALS: Target date: 01/16/2024  Pt will improve FOTO score to at least 63 in order to demonstrate improvement in pain and function with ADL's. Baseline: 41.  12/9: 42 Goal status: Not met  2.  Pt will improve L shoulder MMT scores to at least 4/5 to demonstrate improvement in L shoulder strength. Baseline: Not tested, to be assessed when appropriate going by protocol (post-op month 3 = January 31st) Goal status: Not met  3.  Pt will report <2/10 pain on NPS scale in order to show clinically significant improvement in pain at rest and with mobility. Baseline: 7/10 pain at rest.  12/9: 3-5/10 (slight increase in L shoulder pain this past weekend).   Goal status: Not met  PLAN:  PT FREQUENCY: 2x/week  PT DURATION: 4 weeks  PLANNED INTERVENTIONS: 97164- PT Re-evaluation, 97110-Therapeutic exercises, 97530- Therapeutic activity, [id number]- Neuromuscular re-education, 97140- Manual therapy, Joint mobilization, Cryotherapy, and Moist heat  PLAN FOR NEXT SESSION: Follow MD protocol/ Discuss MD appt.     Lendell Quarry, PT, DPT # [id number] Physical Therapist - [place]  [facility] 12:57 PM,12/28/23

## 2024-01-02 ENCOUNTER — Encounter: Payer: Self-pay | Admitting: Physical Therapy

## 2024-01-02 ENCOUNTER — Ambulatory Visit: Payer: BC Managed Care – PPO | Admitting: Physical Therapy

## 2024-01-02 DIAGNOSIS — Z9889 Other specified postprocedural states: Secondary | ICD-10-CM | POA: Diagnosis not present

## 2024-01-02 DIAGNOSIS — M25612 Stiffness of left shoulder, not elsewhere classified: Secondary | ICD-10-CM

## 2024-01-02 DIAGNOSIS — G8929 Other chronic pain: Secondary | ICD-10-CM

## 2024-01-02 NOTE — Therapy (Addendum)
OUTPATIENT PHYSICAL THERAPY SHOULDER TREATMENT  Patient Name: Hannah Cross MRN: [id number] DOB:07/25/1956, 68 y.o., female Today's Date: 01/02/2024  END OF SESSION:  PT End of Session - 01/02/24 0951     Visit Number 19    Number of Visits 24    Date for PT Re-Evaluation 01/16/24    PT Start Time 0946    PT Stop Time 1032    PT Time Calculation (min) 46 min    Activity Tolerance Patient limited by pain    Behavior During Therapy [facility] for tasks assessed/performed            Past Surgical History:  Procedure Laterality Date   ABDOMINAL HYSTERECTOMY     OOPHORECTOMY     Patient Active Problem List   Diagnosis Date Noted   Syncope 08/06/2019    PCP: Titus Mould, NP  REFERRING PROVIDER: Fletcher Anon, MD  REFERRING DIAG: Incomplete rotator cuff tear or rupture of left shoulder, not specified as traumatic; adhesive capsulitis of left shoulder  THERAPY DIAG:  S/P arthroscopy of left shoulder  Chronic left shoulder pain  Stiffness of left shoulder, not elsewhere classified  S/P left rotator cuff repair  Rationale for Evaluation and Treatment: Rehabilitation  ONSET DATE: 10/13/2023    SUBJECTIVE:                                                                                                                                                                                      SUBJECTIVE STATEMENT: Pt reports to PT s/p L shoulder arthroscopy with rotator cuff repair, extensive debridement, tenodesis of long tendon of biceps, decompression of subacromial space with partial acromioplasty and distal claviculectomy (10/13/2023). Pt reports dealing with chronic bilateral shoulder pain for most of this year and received injections in both shoulders (L side in July); pt reported improvement in pain with her R shoulder but no improvement with her left, which is what resulted in her having the surgery. She says her pain is usually worse at night and she  is having trouble sleeping because of the pain. Pt reports all her sensation has returned since the numbing block wore off earlier this week.  Hand dominance: Right  PERTINENT HISTORY: Adhesive capsulitis, stress fx of L fibula (September 2024)  PAIN:  Are you having pain? Yes: NPRS scale: 7/10 now, 9/10 worst, 2/10 best Pain location: L shoulder Pain description: Sharp, burning Aggravating factors: Movement, having shoulder at rest without support Relieving factors: Ibuprofen  PRECAUTIONS: Shoulder  RED FLAGS: None   WEIGHT BEARING RESTRICTIONS: Yes NWB LUE for first 6 weeks after surgery  FALLS:  Has patient fallen in last 6 months? No  LIVING ENVIRONMENT: Lives with: lives with their spouse Lives in: House/apartment Stairs: Yes: External: 3 steps; none Has following equipment at home: None  OCCUPATION: Safety Specialist  PLOF: Independent  PATIENT GOALS:Get normal ROM back with no pain, get back to cooking for her family  NEXT MD VISIT: TBD  OBJECTIVE:  Note: Objective measures were completed at Evaluation unless otherwise noted.  DIAGNOSTIC FINDINGS:  N/A  PATIENT SURVEYS:  FOTO 41/63  COGNITION: Overall cognitive status: Within functional limits for tasks assessed     SENSATION: Not tested (pt reports sensation has returned since numbing block wore off)  POSTURE: No significant postural limitations noted  UPPER EXTREMITY ROM:   Passive/Active ROM Right Eval (Active)/In sitting Left eval  Shoulder flexion 166 90 (abiding by protocol)  Shoulder extension 75   Shoulder abduction 160   Shoulder adduction    Shoulder internal rotation [facility]   Shoulder external rotation WFL ~10 degrees (abiding by protocol)  Elbow flexion [facility] WFL  Elbow extension [facility] WFL  Wrist flexion    Wrist extension    Wrist ulnar deviation    Wrist radial deviation    Wrist pronation    Wrist supination    (Blank rows = not tested)  UPPER EXTREMITY MMT:  MMT  Right eval Left eval  Shoulder flexion 5   Shoulder extension 5   Shoulder abduction 5   Shoulder adduction    Shoulder internal rotation 5   Shoulder external rotation 4   Middle trapezius    Lower trapezius    Elbow flexion    Elbow extension    Wrist flexion    Wrist extension    Wrist ulnar deviation    Wrist radial deviation    Wrist pronation    Wrist supination    Grip strength (lbs) 36.9 lbs average   (Blank rows = not tested)  12/17: Grip strength: R 51#, L 32#.    Seated shoulder pulley ex. AAROM: flexion/ abduction 20x.  Flex: 146 deg.,  ABD: 150 deg. (ABD 124 deg. AROM).      TODAY'S TREATMENT:                                                                                                                                         DATE: 01/02/2024    Subjective:  Pt. Reports 1/10 L shoulder pain this morning.  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  Pt. Had f/u with MD last week and received an injection and order for aquatic ex.  PT discussed aquatic ex. Options in [place]/ [place].      There.ex.:  Seated shoulder pulley: flexion/ abduction 20x each with static holds.  Warm-up.    Nautilus (handles): seated lat. Pull down with palms down 40# (with shoulder flexion static holds)- 15x2/ standing tricep extension 20#- 15x2/ shoulder adduction 15x2.  Standing scapular  retraction 30# 15x2.  Pt. Reports 2/10 L shoulder discomfort.  1 episode of sharp L shoulder pain during initial reps of seated lat. Pull downs (PT modified handle set-up).    Supine L serratus punches 20x.  L shoulder rhythmic stabs 3x30 sec. (Moderate resistance at elbow).    Supine L shoulder stretches: flexion/ abduction/ ER 10x.  Pain limited with ER.  Contract-relax L shoulder IR/ER technique.      Reviewed HEP   Patient to ice at home   PATIENT EDUCATION: Education details: Role of PT, PT plan of care, icing Person educated: Patient Education  method: Explanation and Demonstration Education comprehension: verbalized understanding  HOME EXERCISE PROGRAM: Access Code: [email or web address] URL: [email or web address] Date: 10/26/2023 Prepared by: Dorene Grebe Exercises - Seated Scapular Retraction - 2 x daily - 7 x weekly - 1 sets - 10 reps - Seated Cervical Rotation AROM - 2 x daily - 7 x weekly - 1 sets - 10 reps - Seated Cervical Sidebending AROM - 2 x daily - 7 x weekly - 1 sets - 10 reps - Standing Circular Shoulder Pendulum Supported with Arm Bent - 2 x daily - 7 x weekly - 1 sets - 10 reps - Seated Elbow Flexion AAROM - 2 x daily - 7 x weekly - 1 sets - 10 reps - Seated AAROM Elbow Supination/Pronation with Clasped Hands - 2 x daily - 7 x weekly - 1 sets - 10 reps  Access Code: [email or web address] URL: [email or web address] Date: 10/28/2023 Prepared by: Dorene Grebe  Exercises - Seated Scapular Retraction  - 2 x daily - 7 x weekly - 1 sets - 10 reps - Seated Cervical Rotation AROM  - 2 x daily - 7 x weekly - 1 sets - 10 reps - Seated Cervical Sidebending AROM  - 2 x daily - 7 x weekly - 1 sets - 10 reps - Standing Circular Shoulder Pendulum Supported with Arm Bent  - 2 x daily - 7 x weekly - 1 sets - 10 reps - Seated Elbow Flexion AAROM  - 2 x daily - 7 x weekly - 1 sets - 10 reps - Seated Shoulder Flexion Self PROM  - 3 x daily - 7 x weekly - 1 sets - 10 reps - 5-10 second hold - Supine Shoulder External Rotation with Dowel  - 3 x daily - 7 x weekly - 1 sets - 10 reps - 5-10 seconds hold  Access Code: [email or web address] URL: [email or web address] Date: 11/16/2023 Prepared by: Dorene Grebe  Exercises - Seated Cervical Sidebending AROM  - 2 x daily - 7 x weekly - 1 sets - 10 reps - Circular Shoulder Pendulum with Table Support  - 2 x daily - 7 x weekly - 1 sets - 10 reps - Seated Shoulder Flexion AAROM with Pulley Behind  - 2 x daily - 7 x weekly - 1 sets - 20 reps - Seated Shoulder Scaption AAROM with Pulley at  Side  - 2 x daily - 7 x weekly - 1 sets - 20 reps - Seated Shoulder Abduction AAROM with Pulley Behind  - 2 x daily - 7 x weekly - 1 sets - 20 reps - Supine Shoulder External Rotation in 45 Degrees Abduction AAROM with Dowel  - 2 x daily - 7 x weekly - 1 sets - 10 reps  Access Code: [email or web address] URL: [email or web address] Date: 12/12/2023 Prepared by: Dorene Grebe  Exercises - Seated Shoulder Flexion AAROM with Pulley Behind  - 2 x daily - 7 x  weekly - 1 sets - 20 reps - Seated Shoulder Scaption AAROM with Pulley at Side  - 2 x daily - 7 x weekly - 1 sets - 20 reps - Seated Shoulder Abduction AAROM with Pulley Behind  - 2 x daily - 7 x weekly - 1 sets - 20 reps - Standing Shoulder Flexion Wall Walk  - 2 x daily - 7 x weekly - 2 sets - 10 reps - Sidelying Shoulder External Rotation  - 2 x daily - 7 x weekly - 2 sets - 10 reps - Supine Pectoralis Stretch  - 1 x daily - 7 x weekly - 1 sets - 10 reps - Standing Isometric Shoulder External Rotation with Doorway  - 1 x daily - 7 x weekly - 2 sets - 10 reps - 5 seconds hold - Standing Isometric Shoulder Flexion with Doorway - Arm Bent  - 1 x daily - 7 x weekly - 2 sets - 10 reps - 5 seconds hold.  ASSESSMENT:  CLINICAL IMPRESSION:    PT tx. session focused on L shoulder A/AROM within protocol and progression of isometric holds/ex.  No compensatory movement patterns with use of Nautilus UE ex.  Pt. with general L shoulder soreness with STM and movement.  Pt. Remains pain limited with L shoulder ER and steady progress with L shoulder flexion/ abduction AROM.   Pt will benefit from continued PT services to increase L shoulder ROM/ stability to improve pain-free mobility.    OBJECTIVE IMPAIRMENTS: decreased ROM, decreased strength, hypomobility, impaired UE functional use, and pain.   ACTIVITY LIMITATIONS: carrying, bathing, dressing, reach over head, and hygiene/grooming  PARTICIPATION LIMITATIONS: meal prep, cleaning, laundry, driving,  community activity, occupation, and yard work  PERSONAL FACTORS: Age and 1 comorbidity: adhesive capsulitis  are also affecting patient's functional outcome.   REHAB POTENTIAL: Good  CLINICAL DECISION MAKING: Stable/uncomplicated  EVALUATION COMPLEXITY: Low  GOALS:  LONG TERM GOALS: Target date: 01/16/2024  Pt will improve FOTO score to at least 63 in order to demonstrate improvement in pain and function with ADL's. Baseline: 41.  12/9: 42 Goal status: Not met  2.  Pt will improve L shoulder MMT scores to at least 4/5 to demonstrate improvement in L shoulder strength. Baseline: Not tested, to be assessed when appropriate going by protocol (post-op month 3 = January 31st) Goal status: Not met  3.  Pt will report <2/10 pain on NPS scale in order to show clinically significant improvement in pain at rest and with mobility. Baseline: 7/10 pain at rest.  12/9: 3-5/10 (slight increase in L shoulder pain this past weekend).   Goal status: Not met  PLAN:  PT FREQUENCY: 2x/week  PT DURATION: 4 weeks  PLANNED INTERVENTIONS: 97164- PT Re-evaluation, 97110-Therapeutic exercises, 97530- Therapeutic activity, 97112- Neuromuscular re-education, 97140- Manual therapy, Joint mobilization, Cryotherapy, and Moist heat  PLAN FOR NEXT SESSION: Follow MD protocol/ Progress HEP  Cammie Mcgee, PT, DPT # [phone] Physical Therapist - [place]  [facility] 12:49 PM,01/02/24

## 2024-01-04 ENCOUNTER — Ambulatory Visit: Payer: BC Managed Care – PPO | Admitting: Physical Therapy

## 2024-01-09 ENCOUNTER — Ambulatory Visit: Payer: BC Managed Care – PPO | Admitting: Physical Therapy

## 2024-01-11 ENCOUNTER — Encounter: Payer: BC Managed Care – PPO | Admitting: Physical Therapy

## 2024-01-17 ENCOUNTER — Ambulatory Visit: Payer: Medicare Other | Attending: Hand Surgery | Admitting: Physical Therapy

## 2024-01-17 ENCOUNTER — Encounter: Payer: Self-pay | Admitting: Physical Therapy

## 2024-01-17 DIAGNOSIS — M25512 Pain in left shoulder: Secondary | ICD-10-CM | POA: Diagnosis present

## 2024-01-17 DIAGNOSIS — Z9889 Other specified postprocedural states: Secondary | ICD-10-CM

## 2024-01-17 DIAGNOSIS — G8929 Other chronic pain: Secondary | ICD-10-CM

## 2024-01-17 DIAGNOSIS — M25612 Stiffness of left shoulder, not elsewhere classified: Secondary | ICD-10-CM

## 2024-01-17 NOTE — Therapy (Signed)
 OUTPATIENT PHYSICAL THERAPY SHOULDER TREATMENT/ RECERTIFICATION Physical Therapy Progress Note  Dates of reporting period  11/28/23   to   01/17/24   Patient Name: Hannah Cross MRN: [id number] DOB:Mar 08, 1956, 68 y.o., female Today's Date: 01/18/2024  END OF SESSION:  PT End of Session - 01/17/24 0945     Visit Number 20    Number of Visits 28    Date for PT Re-Evaluation 02/14/24    PT Start Time 0945    PT Stop Time 1033    PT Time Calculation (min) 48 min    Activity Tolerance Patient limited by pain    Behavior During Therapy [facility] for tasks assessed/performed            Past Surgical History:  Procedure Laterality Date   ABDOMINAL HYSTERECTOMY     OOPHORECTOMY     Patient Active Problem List   Diagnosis Date Noted   Syncope 08/06/2019    PCP: Teresa Almarie Nam, NP  REFERRING PROVIDER: Sandralee Lonni Hamilton, MD  REFERRING DIAG: Incomplete rotator cuff tear or rupture of left shoulder, not specified as traumatic; adhesive capsulitis of left shoulder  THERAPY DIAG:  S/P arthroscopy of left shoulder  Chronic left shoulder pain  Stiffness of left shoulder, not elsewhere classified  S/P left rotator cuff repair  Rationale for Evaluation and Treatment: Rehabilitation  ONSET DATE: 10/13/2023    SUBJECTIVE:                                                                                                                                                                                      SUBJECTIVE STATEMENT: Pt reports to PT s/p L shoulder arthroscopy with rotator cuff repair, extensive debridement, tenodesis of long tendon of biceps, decompression of subacromial space with partial acromioplasty and distal claviculectomy (10/13/2023). Pt reports dealing with chronic bilateral shoulder pain for most of this year and received injections in both shoulders (L side in July); pt reported improvement in pain with her R shoulder but no improvement with her left,  which is what resulted in her having the surgery. She says her pain is usually worse at night and she is having trouble sleeping because of the pain. Pt reports all her sensation has returned since the numbing block wore off earlier this week.  Hand dominance: Right  PERTINENT HISTORY: Adhesive capsulitis, stress fx of L fibula (September 2024)  PAIN:  Are you having pain? Yes: NPRS scale: 7/10 now, 9/10 worst, 2/10 best Pain location: L shoulder Pain description: Sharp, burning Aggravating factors: Movement, having shoulder at rest without support Relieving factors: Ibuprofen  PRECAUTIONS: Shoulder  RED FLAGS: None   WEIGHT BEARING RESTRICTIONS: Yes  NWB LUE for first 6 weeks after surgery  FALLS:  Has patient fallen in last 6 months? No  LIVING ENVIRONMENT: Lives with: lives with their spouse Lives in: House/apartment Stairs: Yes: External: 3 steps; none Has following equipment at home: None  OCCUPATION: Safety Specialist  PLOF: Independent  PATIENT GOALS:Get normal ROM back with no pain, get back to cooking for her family  NEXT MD VISIT: TBD  OBJECTIVE:  Note: Objective measures were completed at Evaluation unless otherwise noted.  DIAGNOSTIC FINDINGS:  N/A  PATIENT SURVEYS:  FOTO 41/63  COGNITION: Overall cognitive status: Within functional limits for tasks assessed     SENSATION: Not tested (pt reports sensation has returned since numbing block wore off)  POSTURE: No significant postural limitations noted  UPPER EXTREMITY ROM:   Passive/Active ROM Right Eval (Active)/In sitting Left eval  Shoulder flexion 166 90 (abiding by protocol)  Shoulder extension 75   Shoulder abduction 160   Shoulder adduction    Shoulder internal rotation [facility]   Shoulder external rotation WFL ~10 degrees (abiding by protocol)  Elbow flexion [facility] WFL  Elbow extension [facility] WFL  Wrist flexion    Wrist extension    Wrist ulnar deviation    Wrist radial deviation    Wrist  pronation    Wrist supination    (Blank rows = not tested)  UPPER EXTREMITY MMT:  MMT Right eval Left eval  Shoulder flexion 5   Shoulder extension 5   Shoulder abduction 5   Shoulder adduction    Shoulder internal rotation 5   Shoulder external rotation 4   Middle trapezius    Lower trapezius    Elbow flexion    Elbow extension    Wrist flexion    Wrist extension    Wrist ulnar deviation    Wrist radial deviation    Wrist pronation    Wrist supination    Grip strength (lbs) 36.9 lbs average   (Blank rows = not tested)  12/17: Grip strength: R 51#, L 32#.     TODAY'S TREATMENT:                                                                                                                                         DATE: 01/18/2024    Subjective:  Pt. Participated with aquatic based there.ex. last week at [facility] but is planning to go [employer] on program through insurance).  Pt. C/o 2/10 L shoulder pain this morning and states her shoulder has been store.  Pt. Reports compliance with HEP/ icing.  Pt. Able to sleep a little bit of the night on the L side.       There.ex.:   Seated/ standing B shoulder A/AROM with wand: 10x flexion/ 10x chest press/ 10x shoulder abduction/ 10x ER/ 10x extension/ 10x IR.     Nautilus: seated lat. Pull down with palms down 50# (  with shoulder flexion static holds)- 15x2/ standing tricep extension 40#- 15x2.  Standing scapular retraction 40#  Pt. Reports 4/10 L shoulder discomfort.    Supine L shoulder stretches:  flexion/ abduction/ ER 10x.  Pain limited with ER.     Supine 3# serratus punches/ shoulder ER 15x2.     Reviewed HEP   Patient to ice at home   PATIENT EDUCATION: Education details: Role of PT, PT plan of care, icing Person educated: Patient Education method: Explanation and Demonstration Education comprehension: verbalized understanding  HOME EXERCISE PROGRAM: Access Code: [email or web address] URL:  [email or web address] Date: 10/26/2023 Prepared by: Ozell Sero Exercises - Seated Scapular Retraction - 2 x daily - 7 x weekly - 1 sets - 10 reps - Seated Cervical Rotation AROM - 2 x daily - 7 x weekly - 1 sets - 10 reps - Seated Cervical Sidebending AROM - 2 x daily - 7 x weekly - 1 sets - 10 reps - Standing Circular Shoulder Pendulum Supported with Arm Bent - 2 x daily - 7 x weekly - 1 sets - 10 reps - Seated Elbow Flexion AAROM - 2 x daily - 7 x weekly - 1 sets - 10 reps - Seated AAROM Elbow Supination/Pronation with Clasped Hands - 2 x daily - 7 x weekly - 1 sets - 10 reps  Access Code: [email or web address] URL: [email or web address] Date: 10/28/2023 Prepared by: Ozell Sero  Exercises - Seated Scapular Retraction  - 2 x daily - 7 x weekly - 1 sets - 10 reps - Seated Cervical Rotation AROM  - 2 x daily - 7 x weekly - 1 sets - 10 reps - Seated Cervical Sidebending AROM  - 2 x daily - 7 x weekly - 1 sets - 10 reps - Standing Circular Shoulder Pendulum Supported with Arm Bent  - 2 x daily - 7 x weekly - 1 sets - 10 reps - Seated Elbow Flexion AAROM  - 2 x daily - 7 x weekly - 1 sets - 10 reps - Seated Shoulder Flexion Self PROM  - 3 x daily - 7 x weekly - 1 sets - 10 reps - 5-10 second hold - Supine Shoulder External Rotation with Dowel  - 3 x daily - 7 x weekly - 1 sets - 10 reps - 5-10 seconds hold  Access Code: [email or web address] URL: [email or web address] Date: 11/16/2023 Prepared by: Ozell Sero  Exercises - Seated Cervical Sidebending AROM  - 2 x daily - 7 x weekly - 1 sets - 10 reps - Circular Shoulder Pendulum with Table Support  - 2 x daily - 7 x weekly - 1 sets - 10 reps - Seated Shoulder Flexion AAROM with Pulley Behind  - 2 x daily - 7 x weekly - 1 sets - 20 reps - Seated Shoulder Scaption AAROM with Pulley at Side  - 2 x daily - 7 x weekly - 1 sets - 20 reps - Seated Shoulder Abduction AAROM with Pulley Behind  - 2 x daily - 7 x weekly - 1 sets - 20  reps - Supine Shoulder External Rotation in 45 Degrees Abduction AAROM with Dowel  - 2 x daily - 7 x weekly - 1 sets - 10 reps  Access Code: [email or web address] URL: [email or web address] Date: 12/12/2023 Prepared by: Ozell Sero  Exercises - Seated Shoulder Flexion AAROM with Pulley Behind  - 2 x daily - 7 x weekly - 1 sets - 20 reps - Seated Shoulder Scaption AAROM with Pulley at Side  - 2 x daily -  7 x weekly - 1 sets - 20 reps - Seated Shoulder Abduction AAROM with Pulley Behind  - 2 x daily - 7 x weekly - 1 sets - 20 reps - Standing Shoulder Flexion Wall Walk  - 2 x daily - 7 x weekly - 2 sets - 10 reps - Sidelying Shoulder External Rotation  - 2 x daily - 7 x weekly - 2 sets - 10 reps - Supine Pectoralis Stretch  - 1 x daily - 7 x weekly - 1 sets - 10 reps - Standing Isometric Shoulder External Rotation with Doorway  - 1 x daily - 7 x weekly - 2 sets - 10 reps - 5 seconds hold - Standing Isometric Shoulder Flexion with Doorway - Arm Bent  - 1 x daily - 7 x weekly - 2 sets - 10 reps - 5 seconds hold.  ASSESSMENT:  CLINICAL IMPRESSION:    PT tx. session focused on L shoulder A/AROM and progression of resisted there.ex./ strengthening.  No compensatory movement patterns with use of Nautilus UE ex.  Pt. with general L shoulder soreness with STM and movement.  Pt. Remains limited with L shoulder ER and steady progress with L shoulder flexion/ abduction AROM.  Pt. Has progressed to aquatic/ gym based ex. At local fitness center.   Pt will benefit from continued PT services to increase L shoulder ROM/ stability to improve pain-free mobility.    OBJECTIVE IMPAIRMENTS: decreased ROM, decreased strength, hypomobility, impaired UE functional use, and pain.   ACTIVITY LIMITATIONS: carrying, bathing, dressing, reach over head, and hygiene/grooming  PARTICIPATION LIMITATIONS: meal prep, cleaning, laundry, driving, community activity, occupation, and yard work  PERSONAL FACTORS: Age and 1  comorbidity: adhesive capsulitis  are also affecting patient's functional outcome.   REHAB POTENTIAL: Good  CLINICAL DECISION MAKING: Stable/uncomplicated  EVALUATION COMPLEXITY: Low  GOALS:  LONG TERM GOALS: Target date: 02/14/2024  Pt will improve FOTO score to at least 63 in order to demonstrate improvement in pain and function with ADL's. Baseline: 41.  12/9: 42.  2/4: 59 Goal status: Partially met  2.  Pt will improve L shoulder MMT scores to at least 4/5 to demonstrate improvement in L shoulder strength. Baseline: Not tested, to be assessed when appropriate going by protocol (post-op month 3 = January 31st) Goal status: Not met  3.  Pt will report <2/10 pain on NPS scale in order to show clinically significant improvement in pain at rest and with mobility. Baseline: 7/10 pain at rest.  12/9: 3-5/10 (slight increase in L shoulder pain this past weekend).   Goal status: Not met  PLAN:  PT FREQUENCY: 2x/week  PT DURATION: 4 weeks  PLANNED INTERVENTIONS: 97164- PT Re-evaluation, 97110-Therapeutic exercises, 97530- Therapeutic activity, 97112- Neuromuscular re-education, 97140- Manual therapy, Joint mobilization, Cryotherapy, and Moist heat  PLAN FOR NEXT SESSION:  Progress UE strengthening/ functional reaching    Ozell JAYSON Sero, PT, DPT # [phone] Physical Therapist - [place]  [facility] 8:30 AM,01/18/24

## 2024-01-19 ENCOUNTER — Encounter: Payer: Self-pay | Admitting: Physical Therapy

## 2024-01-19 ENCOUNTER — Ambulatory Visit: Payer: Medicare Other | Admitting: Physical Therapy

## 2024-01-19 DIAGNOSIS — Z9889 Other specified postprocedural states: Secondary | ICD-10-CM | POA: Diagnosis not present

## 2024-01-19 DIAGNOSIS — M25612 Stiffness of left shoulder, not elsewhere classified: Secondary | ICD-10-CM

## 2024-01-19 DIAGNOSIS — G8929 Other chronic pain: Secondary | ICD-10-CM

## 2024-01-19 NOTE — Therapy (Signed)
 OUTPATIENT PHYSICAL THERAPY SHOULDER TREATMENT   Patient Name: Hannah Cross MRN: [id number] DOB:October 04, 1956, 68 y.o., female Today's Date: 01/19/2024  END OF SESSION:  PT End of Session - 01/19/24 0950     Visit Number 21    Number of Visits 28    Date for PT Re-Evaluation 02/14/24    PT Start Time 0951    PT Stop Time 1031    PT Time Calculation (min) 40 min    Activity Tolerance Patient limited by pain    Behavior During Therapy [facility] for tasks assessed/performed            Past Surgical History:  Procedure Laterality Date   ABDOMINAL HYSTERECTOMY     OOPHORECTOMY     Patient Active Problem List   Diagnosis Date Noted   Syncope 08/06/2019    PCP: Teresa Almarie Nam, NP  REFERRING PROVIDER: Sandralee Lonni Hamilton, MD  REFERRING DIAG: Incomplete rotator cuff tear or rupture of left shoulder, not specified as traumatic; adhesive capsulitis of left shoulder  THERAPY DIAG:  S/P arthroscopy of left shoulder  Chronic left shoulder pain  Stiffness of left shoulder, not elsewhere classified  S/P left rotator cuff repair  Rationale for Evaluation and Treatment: Rehabilitation  ONSET DATE: 10/13/2023    SUBJECTIVE:                                                                                                                                                                                      SUBJECTIVE STATEMENT: Pt reports to PT s/p L shoulder arthroscopy with rotator cuff repair, extensive debridement, tenodesis of long tendon of biceps, decompression of subacromial space with partial acromioplasty and distal claviculectomy (10/13/2023). Pt reports dealing with chronic bilateral shoulder pain for most of this year and received injections in both shoulders (L side in July); pt reported improvement in pain with her R shoulder but no improvement with her left, which is what resulted in her having the surgery. She says her pain is usually worse at night and she  is having trouble sleeping because of the pain. Pt reports all her sensation has returned since the numbing block wore off earlier this week.  Hand dominance: Right  PERTINENT HISTORY: Adhesive capsulitis, stress fx of L fibula (September 2024)  PAIN:  Are you having pain? Yes: NPRS scale: 7/10 now, 9/10 worst, 2/10 best Pain location: L shoulder Pain description: Sharp, burning Aggravating factors: Movement, having shoulder at rest without support Relieving factors: Ibuprofen  PRECAUTIONS: Shoulder  RED FLAGS: None   WEIGHT BEARING RESTRICTIONS: Yes NWB LUE for first 6 weeks after surgery  FALLS:  Has patient fallen in last 6 months?  No  LIVING ENVIRONMENT: Lives with: lives with their spouse Lives in: House/apartment Stairs: Yes: External: 3 steps; none Has following equipment at home: None  OCCUPATION: Safety Specialist  PLOF: Independent  PATIENT GOALS:Get normal ROM back with no pain, get back to cooking for her family  NEXT MD VISIT: TBD  OBJECTIVE:  Note: Objective measures were completed at Evaluation unless otherwise noted.  DIAGNOSTIC FINDINGS:  N/A  PATIENT SURVEYS:  FOTO 41/63  COGNITION: Overall cognitive status: Within functional limits for tasks assessed     SENSATION: Not tested (pt reports sensation has returned since numbing block wore off)  POSTURE: No significant postural limitations noted  UPPER EXTREMITY ROM:   Passive/Active ROM Right Eval (Active)/In sitting Left eval  Shoulder flexion 166 90 (abiding by protocol)  Shoulder extension 75   Shoulder abduction 160   Shoulder adduction    Shoulder internal rotation [facility]   Shoulder external rotation WFL ~10 degrees (abiding by protocol)  Elbow flexion [facility] WFL  Elbow extension [facility] WFL  Wrist flexion    Wrist extension    Wrist ulnar deviation    Wrist radial deviation    Wrist pronation    Wrist supination    (Blank rows = not tested)  UPPER EXTREMITY MMT:  MMT  Right eval Left eval  Shoulder flexion 5   Shoulder extension 5   Shoulder abduction 5   Shoulder adduction    Shoulder internal rotation 5   Shoulder external rotation 4   Middle trapezius    Lower trapezius    Elbow flexion    Elbow extension    Wrist flexion    Wrist extension    Wrist ulnar deviation    Wrist radial deviation    Wrist pronation    Wrist supination    Grip strength (lbs) 36.9 lbs average   (Blank rows = not tested)  12/17: Grip strength: R 51#, L 32#.     TODAY'S TREATMENT:                                                                                                                                         DATE: 01/19/2024    Subjective:  Pt. C/o minimal L shoulder pain this morning and states her shoulder has been doing well over past several days.  Pt. Reports compliance with HEP/ icing.     There.ex.:   See new HEP/ issued GTB  Patient to ice at home   PATIENT EDUCATION: Education details: Role of PT, PT plan of care, icing Person educated: Patient Education method: Explanation and Demonstration Education comprehension: verbalized understanding  HOME EXERCISE PROGRAM: Access Code: [email or web address] URL: [email or web address] Date: 10/26/2023 Prepared by: Ozell Sero Exercises - Seated Scapular Retraction - 2 x daily - 7 x weekly - 1 sets - 10 reps - Seated Cervical Rotation AROM - 2 x daily - 7 x weekly - 1 sets -  10 reps - Seated Cervical Sidebending AROM - 2 x daily - 7 x weekly - 1 sets - 10 reps - Standing Circular Shoulder Pendulum Supported with Arm Bent - 2 x daily - 7 x weekly - 1 sets - 10 reps - Seated Elbow Flexion AAROM - 2 x daily - 7 x weekly - 1 sets - 10 reps - Seated AAROM Elbow Supination/Pronation with Clasped Hands - 2 x daily - 7 x weekly - 1 sets - 10 reps  Access Code: [email or web address] URL: [email or web address] Date: 10/28/2023 Prepared by: Ozell Sero  Exercises - Seated Scapular Retraction  - 2 x daily - 7  x weekly - 1 sets - 10 reps - Seated Cervical Rotation AROM  - 2 x daily - 7 x weekly - 1 sets - 10 reps - Seated Cervical Sidebending AROM  - 2 x daily - 7 x weekly - 1 sets - 10 reps - Standing Circular Shoulder Pendulum Supported with Arm Bent  - 2 x daily - 7 x weekly - 1 sets - 10 reps - Seated Elbow Flexion AAROM  - 2 x daily - 7 x weekly - 1 sets - 10 reps - Seated Shoulder Flexion Self PROM  - 3 x daily - 7 x weekly - 1 sets - 10 reps - 5-10 second hold - Supine Shoulder External Rotation with Dowel  - 3 x daily - 7 x weekly - 1 sets - 10 reps - 5-10 seconds hold  Access Code: [email or web address] URL: [email or web address] Date: 11/16/2023 Prepared by: Ozell Sero  Exercises - Seated Cervical Sidebending AROM  - 2 x daily - 7 x weekly - 1 sets - 10 reps - Circular Shoulder Pendulum with Table Support  - 2 x daily - 7 x weekly - 1 sets - 10 reps - Seated Shoulder Flexion AAROM with Pulley Behind  - 2 x daily - 7 x weekly - 1 sets - 20 reps - Seated Shoulder Scaption AAROM with Pulley at Side  - 2 x daily - 7 x weekly - 1 sets - 20 reps - Seated Shoulder Abduction AAROM with Pulley Behind  - 2 x daily - 7 x weekly - 1 sets - 20 reps - Supine Shoulder External Rotation in 45 Degrees Abduction AAROM with Dowel  - 2 x daily - 7 x weekly - 1 sets - 10 reps  Access Code: [email or web address] URL: [email or web address] Date: 12/12/2023 Prepared by: Ozell Sero  Exercises - Seated Shoulder Flexion AAROM with Pulley Behind  - 2 x daily - 7 x weekly - 1 sets - 20 reps - Seated Shoulder Scaption AAROM with Pulley at Side  - 2 x daily - 7 x weekly - 1 sets - 20 reps - Seated Shoulder Abduction AAROM with Pulley Behind  - 2 x daily - 7 x weekly - 1 sets - 20 reps - Standing Shoulder Flexion Wall Walk  - 2 x daily - 7 x weekly - 2 sets - 10 reps - Sidelying Shoulder External Rotation  - 2 x daily - 7 x weekly - 2 sets - 10 reps - Supine Pectoralis Stretch  - 1 x daily - 7 x weekly -  1 sets - 10 reps - Standing Isometric Shoulder External Rotation with Doorway  - 1 x daily - 7 x weekly - 2 sets - 10 reps - 5 seconds hold - Standing Isometric Shoulder Flexion with Doorway - Arm Bent  - 1 x daily - 7 x weekly - 2  sets - 10 reps - 5 seconds hold.  Access Code: [email or web address] URL: [email or web address] Date: 01/19/2024 Prepared by: Ozell Sero  Exercises - Shoulder W - External Rotation with Resistance  - 1 x daily - 3-4 x weekly - 3 sets - 10 reps - Shoulder extension with resistance - Neutral  - 1 x daily - 3-4 x weekly - 3 sets - 10 reps - Doorway Pec Stretch at 60 Elevation  - 1 x daily - 3-4 x weekly - 3 sets - 10 reps - Squatting High Shoulder Row with Resistance  - 1 x daily - 3-4 x weekly - 3 sets - 10 reps - Standing Tricep Extensions with Resistance  - 1 x daily - 3-4 x weekly - 3 sets - 10 reps - Standing Upright Shoulder Row with Resistance  - 1 x daily - 3-4 x weekly - 3 sets - 10 reps - Standing Single Arm Bicep Curls Supinated with Dumbbell  - 1 x daily - 3-4 x weekly - 3 sets - 10 reps  ASSESSMENT:  CLINICAL IMPRESSION:    PT tx. session focused on L shoulder strengthening and developing a new HEP with use of GTB.  Pt. Requires minimal assist for proper posture/ lifting technique.   Pt. with general L shoulder soreness with STM and movement.  Pt. Remains limited with L shoulder ER/ IR ROM during standing there.ex.  Pt. Has progressed to aquatic/ gym based ex. At local fitness center.   Pt will benefit from continued PT services with a decrease tx. Frequency to increase L shoulder ROM/ stability to improve pain-free mobility.    OBJECTIVE IMPAIRMENTS: decreased ROM, decreased strength, hypomobility, impaired UE functional use, and pain.   ACTIVITY LIMITATIONS: carrying, bathing, dressing, reach over head, and hygiene/grooming  PARTICIPATION LIMITATIONS: meal prep, cleaning, laundry, driving, community activity, occupation, and yard work  PERSONAL  FACTORS: Age and 1 comorbidity: adhesive capsulitis  are also affecting patient's functional outcome.   REHAB POTENTIAL: Good  CLINICAL DECISION MAKING: Stable/uncomplicated  EVALUATION COMPLEXITY: Low  GOALS:  LONG TERM GOALS: Target date: 02/14/2024  Pt will improve FOTO score to at least 63 in order to demonstrate improvement in pain and function with ADL's. Baseline: 41.  12/9: 42.  2/4: 59 Goal status: Partially met  2.  Pt will improve L shoulder MMT scores to at least 4/5 to demonstrate improvement in L shoulder strength. Baseline: Not tested, to be assessed when appropriate going by protocol (post-op month 3 = January 31st) Goal status: Not met  3.  Pt will report <2/10 pain on NPS scale in order to show clinically significant improvement in pain at rest and with mobility. Baseline: 7/10 pain at rest.  12/9: 3-5/10 (slight increase in L shoulder pain this past weekend).   Goal status: Not met  PLAN:  PT FREQUENCY: 2x/week  PT DURATION: 4 weeks  PLANNED INTERVENTIONS: 97164- PT Re-evaluation, 97110-Therapeutic exercises, 97530- Therapeutic activity, 97112- Neuromuscular re-education, 97140- Manual therapy, Joint mobilization, Cryotherapy, and Moist heat  PLAN FOR NEXT SESSION:  Progress UE strengthening/ functional reaching    Ozell JAYSON Sero, PT, DPT # [phone] Physical Therapist - [place]  [facility] 10:32 AM,01/19/24

## 2024-01-25 ENCOUNTER — Ambulatory Visit: Payer: Medicare Other | Admitting: Physical Therapy

## 2024-01-26 ENCOUNTER — Encounter: Payer: Self-pay | Admitting: Physical Therapy

## 2024-01-26 ENCOUNTER — Ambulatory Visit: Payer: Medicare Other | Admitting: Physical Therapy

## 2024-01-26 DIAGNOSIS — Z9889 Other specified postprocedural states: Secondary | ICD-10-CM | POA: Diagnosis not present

## 2024-01-26 DIAGNOSIS — M25612 Stiffness of left shoulder, not elsewhere classified: Secondary | ICD-10-CM

## 2024-01-26 DIAGNOSIS — G8929 Other chronic pain: Secondary | ICD-10-CM

## 2024-01-26 NOTE — Therapy (Signed)
 OUTPATIENT PHYSICAL THERAPY SHOULDER TREATMENT   Patient Name: Hannah Cross MRN: [id number] DOB:1956/04/11, 68 y.o., female Today's Date: 01/26/2024  END OF SESSION:  PT End of Session - 01/26/24 1114     Visit Number 22    Number of Visits 28    Date for PT Re-Evaluation 02/14/24    PT Start Time 1114    Activity Tolerance Patient limited by pain    Behavior During Therapy [facility] for tasks assessed/performed            1114 to 1205  (51 minutes).   Past Surgical History:  Procedure Laterality Date   ABDOMINAL HYSTERECTOMY     OOPHORECTOMY     Patient Active Problem List   Diagnosis Date Noted   Syncope 08/06/2019    PCP: Titus Mould, NP  REFERRING PROVIDER: Klifto, Denyse Dago, MD  REFERRING DIAG: Incomplete rotator cuff tear or rupture of left shoulder, not specified as traumatic; adhesive capsulitis of left shoulder  THERAPY DIAG:  S/P arthroscopy of left shoulder  Chronic left shoulder pain  Stiffness of left shoulder, not elsewhere classified  S/P left rotator cuff repair  Rationale for Evaluation and Treatment: Rehabilitation  ONSET DATE: 10/13/2023    SUBJECTIVE:                                                                                                                                                                                      SUBJECTIVE STATEMENT: Pt reports to PT s/p L shoulder arthroscopy with rotator cuff repair, extensive debridement, tenodesis of long tendon of biceps, decompression of subacromial space with partial acromioplasty and distal claviculectomy (10/13/2023). Pt reports dealing with chronic bilateral shoulder pain for most of this year and received injections in both shoulders (L side in July); pt reported improvement in pain with her R shoulder but no improvement with her left, which is what resulted in her having the surgery. She says her pain is usually worse at night and she is having trouble sleeping  because of the pain. Pt reports all her sensation has returned since the numbing block wore off earlier this week.  Hand dominance: Right  PERTINENT HISTORY: Adhesive capsulitis, stress fx of L fibula (September 2024)  PAIN:  Are you having pain? Yes: NPRS scale: 7/10 now, 9/10 worst, 2/10 best Pain location: L shoulder Pain description: Sharp, burning Aggravating factors: Movement, having shoulder at rest without support Relieving factors: Ibuprofen  PRECAUTIONS: Shoulder  RED FLAGS: None   WEIGHT BEARING RESTRICTIONS: Yes NWB LUE for first 6 weeks after surgery  FALLS:  Has patient fallen in last 6 months? No  LIVING ENVIRONMENT: Lives with: lives with  their spouse Lives in: House/apartment Stairs: Yes: External: 3 steps; none Has following equipment at home: None  OCCUPATION: Safety Specialist  PLOF: Independent  PATIENT GOALS:Get normal ROM back with no pain, get back to cooking for her family  NEXT MD VISIT: TBD  OBJECTIVE:  Note: Objective measures were completed at Evaluation unless otherwise noted.  DIAGNOSTIC FINDINGS:  N/A  PATIENT SURVEYS:  FOTO 41/63  COGNITION: Overall cognitive status: Within functional limits for tasks assessed     SENSATION: Not tested (pt reports sensation has returned since numbing block wore off)  POSTURE: No significant postural limitations noted  UPPER EXTREMITY ROM:   Passive/Active ROM Right Eval (Active)/In sitting Left eval  Shoulder flexion 166 90 (abiding by protocol)  Shoulder extension 75   Shoulder abduction 160   Shoulder adduction    Shoulder internal rotation [facility]   Shoulder external rotation WFL ~10 degrees (abiding by protocol)  Elbow flexion [facility] WFL  Elbow extension [facility] WFL  Wrist flexion    Wrist extension    Wrist ulnar deviation    Wrist radial deviation    Wrist pronation    Wrist supination    (Blank rows = not tested)  UPPER EXTREMITY MMT:  MMT Right eval Left eval  Shoulder  flexion 5   Shoulder extension 5   Shoulder abduction 5   Shoulder adduction    Shoulder internal rotation 5   Shoulder external rotation 4   Middle trapezius    Lower trapezius    Elbow flexion    Elbow extension    Wrist flexion    Wrist extension    Wrist ulnar deviation    Wrist radial deviation    Wrist pronation    Wrist supination    Grip strength (lbs) 36.9 lbs average   (Blank rows = not tested)  12/17: Grip strength: R 51#, L 32#.     TODAY'S TREATMENT:                                                                                                                                         DATE: 01/26/2024    Subjective:  Pt. C/o minimal L shoulder pain this morning and states her shoulder has been doing well over past several days.  Pt. Reports compliance with HEP/ icing.  Pt. Hasn't been to pool ex. Program secondary to cold weather.     There.ex.:   Seated wt. Wand shoulder flexion/ abduction 20x.  Standing sh. Extension/ IR 10x (modified secondary neck discomfort).    Standing Nautilus: wand scap. Depression 50#/ Standing sh. Adduction with handles 20# (attempted 30# initially with discomfort)/   Standing static pec stretches with wand (as tolerated).  Added trunk rotn.  Soreness/ tightness in L and R biceps tendon.   Pec stretches at stair handrails with short static holds.    Standing TRX shoulder flexion walk outs 10x (as tolerated).  Seated shoulder flexion with white bolster with focus on opening sh./ thoracic extension.    Discussed HEP/ posture correction and pec stretches.     Patient to ice at home   PATIENT EDUCATION: Education details: Role of PT, PT plan of care, icing/ HEP with GTB.   Person educated: Patient Education method: Medical illustrator Education comprehension: verbalized understanding  HOME EXERCISE PROGRAM: Access Code: [email or web address] URL: [email or web address] Date: 10/26/2023 Prepared by: Dorene Grebe  Exercises - Seated Scapular Retraction - 2 x daily - 7 x weekly - 1 sets - 10 reps - Seated Cervical Rotation AROM - 2 x daily - 7 x weekly - 1 sets - 10 reps - Seated Cervical Sidebending AROM - 2 x daily - 7 x weekly - 1 sets - 10 reps - Standing Circular Shoulder Pendulum Supported with Arm Bent - 2 x daily - 7 x weekly - 1 sets - 10 reps - Seated Elbow Flexion AAROM - 2 x daily - 7 x weekly - 1 sets - 10 reps - Seated AAROM Elbow Supination/Pronation with Clasped Hands - 2 x daily - 7 x weekly - 1 sets - 10 reps  Access Code: [email or web address] URL: [email or web address] Date: 10/28/2023 Prepared by: Dorene Grebe  Exercises - Seated Scapular Retraction  - 2 x daily - 7 x weekly - 1 sets - 10 reps - Seated Cervical Rotation AROM  - 2 x daily - 7 x weekly - 1 sets - 10 reps - Seated Cervical Sidebending AROM  - 2 x daily - 7 x weekly - 1 sets - 10 reps - Standing Circular Shoulder Pendulum Supported with Arm Bent  - 2 x daily - 7 x weekly - 1 sets - 10 reps - Seated Elbow Flexion AAROM  - 2 x daily - 7 x weekly - 1 sets - 10 reps - Seated Shoulder Flexion Self PROM  - 3 x daily - 7 x weekly - 1 sets - 10 reps - 5-10 second hold - Supine Shoulder External Rotation with Dowel  - 3 x daily - 7 x weekly - 1 sets - 10 reps - 5-10 seconds hold  Access Code: [email or web address] URL: [email or web address] Date: 11/16/2023 Prepared by: Dorene Grebe  Exercises - Seated Cervical Sidebending AROM  - 2 x daily - 7 x weekly - 1 sets - 10 reps - Circular Shoulder Pendulum with Table Support  - 2 x daily - 7 x weekly - 1 sets - 10 reps - Seated Shoulder Flexion AAROM with Pulley Behind  - 2 x daily - 7 x weekly - 1 sets - 20 reps - Seated Shoulder Scaption AAROM with Pulley at Side  - 2 x daily - 7 x weekly - 1 sets - 20 reps - Seated Shoulder Abduction AAROM with Pulley Behind  - 2 x daily - 7 x weekly - 1 sets - 20 reps - Supine Shoulder External Rotation in 45 Degrees Abduction AAROM with  Dowel  - 2 x daily - 7 x weekly - 1 sets - 10 reps  Access Code: [email or web address] URL: [email or web address] Date: 12/12/2023 Prepared by: Dorene Grebe  Exercises - Seated Shoulder Flexion AAROM with Pulley Behind  - 2 x daily - 7 x weekly - 1 sets - 20 reps - Seated Shoulder Scaption AAROM with Pulley at Side  - 2 x daily - 7 x weekly - 1 sets - 20 reps - Seated Shoulder Abduction AAROM with Pulley Behind  - 2 x daily - 7 x weekly -  1 sets - 20 reps - Standing Shoulder Flexion Wall Walk  - 2 x daily - 7 x weekly - 2 sets - 10 reps - Sidelying Shoulder External Rotation  - 2 x daily - 7 x weekly - 2 sets - 10 reps - Supine Pectoralis Stretch  - 1 x daily - 7 x weekly - 1 sets - 10 reps - Standing Isometric Shoulder External Rotation with Doorway  - 1 x daily - 7 x weekly - 2 sets - 10 reps - 5 seconds hold - Standing Isometric Shoulder Flexion with Doorway - Arm Bent  - 1 x daily - 7 x weekly - 2 sets - 10 reps - 5 seconds hold.  Access Code: [email or web address] URL: [email or web address] Date: 01/19/2024 Prepared by: Dorene Grebe  Exercises - Shoulder W - External Rotation with Resistance  - 1 x daily - 3-4 x weekly - 3 sets - 10 reps - Shoulder extension with resistance - Neutral  - 1 x daily - 3-4 x weekly - 3 sets - 10 reps - Doorway Pec Stretch at 60 Elevation  - 1 x daily - 3-4 x weekly - 3 sets - 10 reps - Squatting High Shoulder Row with Resistance  - 1 x daily - 3-4 x weekly - 3 sets - 10 reps - Standing Tricep Extensions with Resistance  - 1 x daily - 3-4 x weekly - 3 sets - 10 reps - Standing Upright Shoulder Row with Resistance  - 1 x daily - 3-4 x weekly - 3 sets - 10 reps - Standing Single Arm Bicep Curls Supinated with Dumbbell  - 1 x daily - 3-4 x weekly - 3 sets - 10 reps  ASSESSMENT:  CLINICAL IMPRESSION:    PT tx. session focused on L shoulder strengthening and posture correction.  Pt. Requires minimal assist for proper posture/ lifting technique.   Pt.  with general L shoulder soreness with STM and movement.  Pt. Remains limited with L shoulder ER/ IR ROM during standing there.ex.  Limited B pec stretches and instructed on seated/ standing ex.  Pt. Has progressed to aquatic/ gym based ex. At local fitness center.   Pt will benefit from continued PT services with a decrease tx. Frequency to increase L shoulder ROM/ stability to improve pain-free mobility.    OBJECTIVE IMPAIRMENTS: decreased ROM, decreased strength, hypomobility, impaired UE functional use, and pain.   ACTIVITY LIMITATIONS: carrying, bathing, dressing, reach over head, and hygiene/grooming  PARTICIPATION LIMITATIONS: meal prep, cleaning, laundry, driving, community activity, occupation, and yard work  PERSONAL FACTORS: Age and 1 comorbidity: adhesive capsulitis  are also affecting patient's functional outcome.   REHAB POTENTIAL: Good  CLINICAL DECISION MAKING: Stable/uncomplicated  EVALUATION COMPLEXITY: Low  GOALS:  LONG TERM GOALS: Target date: 02/14/2024  Pt will improve FOTO score to at least 63 in order to demonstrate improvement in pain and function with ADL's. Baseline: 41.  12/9: 42.  2/4: 59 Goal status: Partially met  2.  Pt will improve L shoulder MMT scores to at least 4/5 to demonstrate improvement in L shoulder strength. Baseline: Not tested, to be assessed when appropriate going by protocol (post-op month 3 = January 31st) Goal status: Not met  3.  Pt will report <2/10 pain on NPS scale in order to show clinically significant improvement in pain at rest and with mobility. Baseline: 7/10 pain at rest.  12/9: 3-5/10 (slight increase in L shoulder pain this past weekend).   Goal status: Not met  PLAN:  PT FREQUENCY: 2x/week  PT DURATION: 4 weeks  PLANNED INTERVENTIONS: 97164- PT Re-evaluation, 97110-Therapeutic exercises, 97530- Therapeutic activity, 97112- Neuromuscular re-education, 97140- Manual therapy, Joint mobilization, Cryotherapy, and Moist  heat  PLAN FOR NEXT SESSION:  Progress UE strengthening/ functional reaching    Cammie Mcgee, PT, DPT # [phone] Physical Therapist - [place]  [facility] 11:15 AM,01/26/24

## 2024-02-07 ENCOUNTER — Ambulatory Visit: Payer: Medicare Other | Admitting: Physical Therapy

## 2024-02-07 ENCOUNTER — Encounter: Payer: Self-pay | Admitting: Physical Therapy

## 2024-02-07 DIAGNOSIS — Z9889 Other specified postprocedural states: Secondary | ICD-10-CM

## 2024-02-07 DIAGNOSIS — G8929 Other chronic pain: Secondary | ICD-10-CM

## 2024-02-07 DIAGNOSIS — M25612 Stiffness of left shoulder, not elsewhere classified: Secondary | ICD-10-CM

## 2024-02-07 NOTE — Therapy (Signed)
 OUTPATIENT PHYSICAL THERAPY SHOULDER TREATMENT   Patient Name: Hannah Cross MRN: [id number] DOB:12-14-1955, 68 y.o., female Today's Date: 02/07/2024  END OF SESSION:  PT End of Session - 02/07/24 1130     Visit Number 23    Number of Visits 28    Date for PT Re-Evaluation 02/14/24    PT Start Time 1116    PT Stop Time 1200    PT Time Calculation (min) 44 min    Activity Tolerance Patient limited by pain    Behavior During Therapy [facility] for tasks assessed/performed            Past Surgical History:  Procedure Laterality Date   ABDOMINAL HYSTERECTOMY     OOPHORECTOMY     Patient Active Problem List   Diagnosis Date Noted   Syncope 08/06/2019    PCP: Titus Mould, NP  REFERRING PROVIDER: Fletcher Anon, MD  REFERRING DIAG: Incomplete rotator cuff tear or rupture of left shoulder, not specified as traumatic; adhesive capsulitis of left shoulder  THERAPY DIAG:  S/P arthroscopy of left shoulder  Chronic left shoulder pain  Stiffness of left shoulder, not elsewhere classified  S/P left rotator cuff repair  Rationale for Evaluation and Treatment: Rehabilitation  ONSET DATE: 10/13/2023    SUBJECTIVE:                                                                                                                                                                                      SUBJECTIVE STATEMENT: Pt reports to PT s/p L shoulder arthroscopy with rotator cuff repair, extensive debridement, tenodesis of long tendon of biceps, decompression of subacromial space with partial acromioplasty and distal claviculectomy (10/13/2023). Pt reports dealing with chronic bilateral shoulder pain for most of this year and received injections in both shoulders (L side in July); pt reported improvement in pain with her R shoulder but no improvement with her left, which is what resulted in her having the surgery. She says her pain is usually worse at night and she  is having trouble sleeping because of the pain. Pt reports all her sensation has returned since the numbing block wore off earlier this week.  Hand dominance: Right  PERTINENT HISTORY: Adhesive capsulitis, stress fx of L fibula (September 2024)  PAIN:  Are you having pain? Yes: NPRS scale: 7/10 now, 9/10 worst, 2/10 best Pain location: L shoulder Pain description: Sharp, burning Aggravating factors: Movement, having shoulder at rest without support Relieving factors: Ibuprofen  PRECAUTIONS: Shoulder  RED FLAGS: None   WEIGHT BEARING RESTRICTIONS: Yes NWB LUE for first 6 weeks after surgery  FALLS:  Has patient fallen in last 6 months?  No  LIVING ENVIRONMENT: Lives with: lives with their spouse Lives in: House/apartment Stairs: Yes: External: 3 steps; none Has following equipment at home: None  OCCUPATION: Safety Specialist  PLOF: Independent  PATIENT GOALS:Get normal ROM back with no pain, get back to cooking for her family  NEXT MD VISIT: TBD  OBJECTIVE:  Note: Objective measures were completed at Evaluation unless otherwise noted.  DIAGNOSTIC FINDINGS:  N/A  PATIENT SURVEYS:  FOTO 41/63  COGNITION: Overall cognitive status: Within functional limits for tasks assessed     SENSATION: Not tested (pt reports sensation has returned since numbing block wore off)  POSTURE: No significant postural limitations noted  UPPER EXTREMITY ROM:   Passive/Active ROM Right Eval (Active)/In sitting Left eval  Shoulder flexion 166 90 (abiding by protocol)  Shoulder extension 75   Shoulder abduction 160   Shoulder adduction    Shoulder internal rotation [facility]   Shoulder external rotation WFL ~10 degrees (abiding by protocol)  Elbow flexion [facility] WFL  Elbow extension [facility] WFL  Wrist flexion    Wrist extension    Wrist ulnar deviation    Wrist radial deviation    Wrist pronation    Wrist supination    (Blank rows = not tested)  UPPER EXTREMITY MMT:  MMT  Right eval Left eval  Shoulder flexion 5   Shoulder extension 5   Shoulder abduction 5   Shoulder adduction    Shoulder internal rotation 5   Shoulder external rotation 4   Middle trapezius    Lower trapezius    Elbow flexion    Elbow extension    Wrist flexion    Wrist extension    Wrist ulnar deviation    Wrist radial deviation    Wrist pronation    Wrist supination    Grip strength (lbs) 36.9 lbs average   (Blank rows = not tested)  12/17: Grip strength: R 51#, L 32#.     TODAY'S TREATMENT:                                                                                                                                         DATE: 02/07/2024    Subjective:  Pt. C/o 2/10 L shoulder pain this morning and states her shoulder has been doing well over past several days.  Pt. Has been going to gym on a consistent 3x/week basis   There.ex.:   Standing Nautilus: wand scap. retraction 40#/ standing chest press 30#/ seated lat pull downs 50# (added sh. Flexion static holds) 12x2.   UPPER EXTREMITY ROM: Reassessment of B shoulder AROM   Active ROM Right  (Active)/In sitting Left   Shoulder flexion 168 deg. 145 deg.  Shoulder extension 65 deg. 45 deg.  Shoulder abduction 158 deg. 131 deg.   Shoulder adduction      Shoulder internal rotation 82 deg. 58 deg.   Shoulder external rotation 75  deg. 64 deg.  Elbow flexion [facility] WFL  Elbow extension [facility] WFL  Wrist flexion      Wrist extension      Wrist ulnar deviation      Wrist radial deviation      Wrist pronation      Wrist supination      (Blank rows = not tested)   UPPER EXTREMITY MMT: Reassessment of B shoulder MMT   MMT Right eval Left eval  Shoulder flexion 4-  4  Shoulder extension 4-  3  Shoulder abduction 4  3+  Shoulder adduction      Shoulder internal rotation 5  4  Shoulder external rotation 4-  3+  Middle trapezius  3  4  Lower trapezius      Elbow flexion      Elbow extension      Wrist flexion       Wrist extension      Wrist ulnar deviation      Wrist radial deviation      Wrist pronation      Wrist supination      Grip strength (lbs) 59.8#  44.6#  (Blank rows = not tested)    Standing static pec stretches at door frame.  Use of dowel for L shoulder ER/ pec stretches with added trunk rotn.  Soreness/ tightness in L and R biceps tendon.  Discussed posture correction and pec stretches at home.  Standing TRX shoulder flexion walk outs 10x (as tolerated).    Patient to ice at home   PATIENT EDUCATION: Education details: Role of PT, PT plan of care, icing/ HEP with GTB.   Person educated: Patient Education method: Medical illustrator Education comprehension: verbalized understanding  HOME EXERCISE PROGRAM: Access Code: [email or web address] URL: [email or web address] Date: 10/26/2023 Prepared by: Dorene Grebe Exercises - Seated Scapular Retraction - 2 x daily - 7 x weekly - 1 sets - 10 reps - Seated Cervical Rotation AROM - 2 x daily - 7 x weekly - 1 sets - 10 reps - Seated Cervical Sidebending AROM - 2 x daily - 7 x weekly - 1 sets - 10 reps - Standing Circular Shoulder Pendulum Supported with Arm Bent - 2 x daily - 7 x weekly - 1 sets - 10 reps - Seated Elbow Flexion AAROM - 2 x daily - 7 x weekly - 1 sets - 10 reps - Seated AAROM Elbow Supination/Pronation with Clasped Hands - 2 x daily - 7 x weekly - 1 sets - 10 reps  Access Code: [email or web address] URL: [email or web address] Date: 10/28/2023 Prepared by: Dorene Grebe  Exercises - Seated Scapular Retraction  - 2 x daily - 7 x weekly - 1 sets - 10 reps - Seated Cervical Rotation AROM  - 2 x daily - 7 x weekly - 1 sets - 10 reps - Seated Cervical Sidebending AROM  - 2 x daily - 7 x weekly - 1 sets - 10 reps - Standing Circular Shoulder Pendulum Supported with Arm Bent  - 2 x daily - 7 x weekly - 1 sets - 10 reps - Seated Elbow Flexion AAROM  - 2 x daily - 7 x weekly - 1 sets - 10 reps - Seated Shoulder Flexion  Self PROM  - 3 x daily - 7 x weekly - 1 sets - 10 reps - 5-10 second hold - Supine Shoulder External Rotation with Dowel  - 3 x daily - 7 x weekly - 1 sets - 10 reps - 5-10 seconds  hold  Access Code: [email or web address] URL: [email or web address] Date: 11/16/2023 Prepared by: Dorene Grebe  Exercises - Seated Cervical Sidebending AROM  - 2 x daily - 7 x weekly - 1 sets - 10 reps - Circular Shoulder Pendulum with Table Support  - 2 x daily - 7 x weekly - 1 sets - 10 reps - Seated Shoulder Flexion AAROM with Pulley Behind  - 2 x daily - 7 x weekly - 1 sets - 20 reps - Seated Shoulder Scaption AAROM with Pulley at Side  - 2 x daily - 7 x weekly - 1 sets - 20 reps - Seated Shoulder Abduction AAROM with Pulley Behind  - 2 x daily - 7 x weekly - 1 sets - 20 reps - Supine Shoulder External Rotation in 45 Degrees Abduction AAROM with Dowel  - 2 x daily - 7 x weekly - 1 sets - 10 reps  Access Code: [email or web address] URL: [email or web address] Date: 12/12/2023 Prepared by: Dorene Grebe  Exercises - Seated Shoulder Flexion AAROM with Pulley Behind  - 2 x daily - 7 x weekly - 1 sets - 20 reps - Seated Shoulder Scaption AAROM with Pulley at Side  - 2 x daily - 7 x weekly - 1 sets - 20 reps - Seated Shoulder Abduction AAROM with Pulley Behind  - 2 x daily - 7 x weekly - 1 sets - 20 reps - Standing Shoulder Flexion Wall Walk  - 2 x daily - 7 x weekly - 2 sets - 10 reps - Sidelying Shoulder External Rotation  - 2 x daily - 7 x weekly - 2 sets - 10 reps - Supine Pectoralis Stretch  - 1 x daily - 7 x weekly - 1 sets - 10 reps - Standing Isometric Shoulder External Rotation with Doorway  - 1 x daily - 7 x weekly - 2 sets - 10 reps - 5 seconds hold - Standing Isometric Shoulder Flexion with Doorway - Arm Bent  - 1 x daily - 7 x weekly - 2 sets - 10 reps - 5 seconds hold.  Access Code: [email or web address] URL: [email or web address] Date: 01/19/2024 Prepared by: Dorene Grebe  Exercises -  Shoulder W - External Rotation with Resistance  - 1 x daily - 3-4 x weekly - 3 sets - 10 reps - Shoulder extension with resistance - Neutral  - 1 x daily - 3-4 x weekly - 3 sets - 10 reps - Doorway Pec Stretch at 60 Elevation  - 1 x daily - 3-4 x weekly - 3 sets - 10 reps - Squatting High Shoulder Row with Resistance  - 1 x daily - 3-4 x weekly - 3 sets - 10 reps - Standing Tricep Extensions with Resistance  - 1 x daily - 3-4 x weekly - 3 sets - 10 reps - Standing Upright Shoulder Row with Resistance  - 1 x daily - 3-4 x weekly - 3 sets - 10 reps - Standing Single Arm Bicep Curls Supinated with Dumbbell  - 1 x daily - 3-4 x weekly - 3 sets - 10 reps  ASSESSMENT:  CLINICAL IMPRESSION:    PT tx. session focused on L shoulder strengthening and posture correction.  Marked increase in L shoulder AROM/ MMT as compared to last reassessment.   Pt. with general L shoulder soreness with STM and movement.  Pt. Remains limited B pec stretches and instructed on seated/ standing ex.  Pt. Has progressed to aquatic/ gym based ex. At local fitness center.   Pt will benefit  from continued PT services with a decrease tx. Frequency to increase L shoulder ROM/ stability to improve pain-free mobility.    OBJECTIVE IMPAIRMENTS: decreased ROM, decreased strength, hypomobility, impaired UE functional use, and pain.   ACTIVITY LIMITATIONS: carrying, bathing, dressing, reach over head, and hygiene/grooming  PARTICIPATION LIMITATIONS: meal prep, cleaning, laundry, driving, community activity, occupation, and yard work  PERSONAL FACTORS: Age and 1 comorbidity: adhesive capsulitis  are also affecting patient's functional outcome.   REHAB POTENTIAL: Good  CLINICAL DECISION MAKING: Stable/uncomplicated  EVALUATION COMPLEXITY: Low  GOALS:  LONG TERM GOALS: Target date: 02/14/2024  Pt will improve FOTO score to at least 63 in order to demonstrate improvement in pain and function with ADL's. Baseline: 41.  12/9: 42.   2/4: 59 Goal status: Partially met  2.  Pt will improve L shoulder MMT scores to at least 4/5 to demonstrate improvement in L shoulder strength. Baseline: Not tested, to be assessed when appropriate going by protocol (post-op month 3 = January 31st) Goal status: Not met  3.  Pt will report <2/10 pain on NPS scale in order to show clinically significant improvement in pain at rest and with mobility. Baseline: 7/10 pain at rest.  12/9: 3-5/10 (slight increase in L shoulder pain this past weekend).   Goal status: Not met  PLAN:  PT FREQUENCY: 2x/week  PT DURATION: 4 weeks  PLANNED INTERVENTIONS: 97164- PT Re-evaluation, 97110-Therapeutic exercises, 97530- Therapeutic activity, 97112- Neuromuscular re-education, 97140- Manual therapy, Joint mobilization, Cryotherapy, and Moist heat  PLAN FOR NEXT SESSION:  Progress UE strengthening/ functional reaching.  1 more tx. Prior to cruise    Cammie Mcgee, PT, DPT # [phone] Physical Therapist - [place]  [facility] 12:15 PM,02/07/24

## 2024-02-14 ENCOUNTER — Ambulatory Visit: Payer: Medicare Other | Attending: Hand Surgery | Admitting: Physical Therapy

## 2024-02-14 ENCOUNTER — Encounter: Payer: Self-pay | Admitting: Physical Therapy

## 2024-02-14 DIAGNOSIS — G8929 Other chronic pain: Secondary | ICD-10-CM | POA: Diagnosis present

## 2024-02-14 DIAGNOSIS — M25612 Stiffness of left shoulder, not elsewhere classified: Secondary | ICD-10-CM | POA: Diagnosis present

## 2024-02-14 DIAGNOSIS — Z9889 Other specified postprocedural states: Secondary | ICD-10-CM | POA: Diagnosis present

## 2024-02-14 DIAGNOSIS — M25512 Pain in left shoulder: Secondary | ICD-10-CM | POA: Diagnosis present

## 2024-02-14 NOTE — Therapy (Signed)
 OUTPATIENT PHYSICAL THERAPY SHOULDER TREATMENT   Patient Name: Hannah Cross MRN: [id number] DOB:May 03, 1956, 68 y.o., female Today's Date: 02/14/2024  END OF SESSION:  PT End of Session - 02/14/24 1030     Visit Number 24    Number of Visits 28    Date for PT Re-Evaluation 02/14/24    PT Start Time 1030    PT Stop Time 1114    PT Time Calculation (min) 44 min    Activity Tolerance Patient limited by pain    Behavior During Therapy [facility] for tasks assessed/performed            Past Surgical History:  Procedure Laterality Date   ABDOMINAL HYSTERECTOMY     OOPHORECTOMY     Patient Active Problem List   Diagnosis Date Noted   Syncope 08/06/2019    PCP: Titus Mould, NP  REFERRING PROVIDER: Fletcher Anon, MD  REFERRING DIAG: Incomplete rotator cuff tear or rupture of left shoulder, not specified as traumatic; adhesive capsulitis of left shoulder  THERAPY DIAG:  S/P arthroscopy of left shoulder  Chronic left shoulder pain  Stiffness of left shoulder, not elsewhere classified  S/P left rotator cuff repair  Rationale for Evaluation and Treatment: Rehabilitation  ONSET DATE: 10/13/2023    SUBJECTIVE:                                                                                                                                                                                      SUBJECTIVE STATEMENT: Pt reports to PT s/p L shoulder arthroscopy with rotator cuff repair, extensive debridement, tenodesis of long tendon of biceps, decompression of subacromial space with partial acromioplasty and distal claviculectomy (10/13/2023). Pt reports dealing with chronic bilateral shoulder pain for most of this year and received injections in both shoulders (L side in July); pt reported improvement in pain with her R shoulder but no improvement with her left, which is what resulted in her having the surgery. She says her pain is usually worse at night and she  is having trouble sleeping because of the pain. Pt reports all her sensation has returned since the numbing block wore off earlier this week.  Hand dominance: Right  PERTINENT HISTORY: Adhesive capsulitis, stress fx of L fibula (September 2024)  PAIN:  Are you having pain? Yes: NPRS scale: 7/10 now, 9/10 worst, 2/10 best Pain location: L shoulder Pain description: Sharp, burning Aggravating factors: Movement, having shoulder at rest without support Relieving factors: Ibuprofen  PRECAUTIONS: Shoulder  RED FLAGS: None   WEIGHT BEARING RESTRICTIONS: Yes NWB LUE for first 6 weeks after surgery  FALLS:  Has patient fallen in last 6 months?  No  LIVING ENVIRONMENT: Lives with: lives with their spouse Lives in: House/apartment Stairs: Yes: External: 3 steps; none Has following equipment at home: None  OCCUPATION: Safety Specialist  PLOF: Independent  PATIENT GOALS:Get normal ROM back with no pain, get back to cooking for her family  NEXT MD VISIT: TBD  OBJECTIVE:  Note: Objective measures were completed at Evaluation unless otherwise noted.  DIAGNOSTIC FINDINGS:  N/A  PATIENT SURVEYS:  FOTO 41/63  COGNITION: Overall cognitive status: Within functional limits for tasks assessed     SENSATION: Not tested (pt reports sensation has returned since numbing block wore off)  POSTURE: No significant postural limitations noted  UPPER EXTREMITY ROM:   Passive/Active ROM Right Eval (Active)/In sitting Left eval  Shoulder flexion 166 90 (abiding by protocol)  Shoulder extension 75   Shoulder abduction 160   Shoulder adduction    Shoulder internal rotation [facility]   Shoulder external rotation WFL ~10 degrees (abiding by protocol)  Elbow flexion [facility] WFL  Elbow extension [facility] WFL  Wrist flexion    Wrist extension    Wrist ulnar deviation    Wrist radial deviation    Wrist pronation    Wrist supination    (Blank rows = not tested)  UPPER EXTREMITY MMT:  MMT  Right eval Left eval  Shoulder flexion 5   Shoulder extension 5   Shoulder abduction 5   Shoulder adduction    Shoulder internal rotation 5   Shoulder external rotation 4   Middle trapezius    Lower trapezius    Elbow flexion    Elbow extension    Wrist flexion    Wrist extension    Wrist ulnar deviation    Wrist radial deviation    Wrist pronation    Wrist supination    Grip strength (lbs) 36.9 lbs average   (Blank rows = not tested)  12/17: Grip strength: R 51#, L 32#.     UPPER EXTREMITY ROM: Reassessment of B shoulder AROM   Active ROM Right  (Active)/In sitting Left   Shoulder flexion 168 deg. 145 deg.  Shoulder extension 65 deg. 45 deg.  Shoulder abduction 158 deg. 131 deg.   Shoulder adduction      Shoulder internal rotation 82 deg. 58 deg.   Shoulder external rotation 75 deg. 64 deg.  Elbow flexion [facility] WFL  Elbow extension [facility] WFL  Wrist flexion      Wrist extension      Wrist ulnar deviation      Wrist radial deviation      Wrist pronation      Wrist supination      (Blank rows = not tested)   UPPER EXTREMITY MMT: Reassessment of B shoulder MMT   MMT Right eval Left eval  Shoulder flexion 4-  4  Shoulder extension 4-  3  Shoulder abduction 4  3+  Shoulder adduction      Shoulder internal rotation 5  4  Shoulder external rotation 4-  3+  Middle trapezius  3  4  Lower trapezius      Elbow flexion      Elbow extension      Wrist flexion      Wrist extension      Wrist ulnar deviation      Wrist radial deviation      Wrist pronation      Wrist supination      Grip strength (lbs) 59.8#  44.6#  (Blank rows = not tested)  TODAY'S TREATMENT:                                                                                                                                         DATE: 02/14/2024    Subjective:  Pt. States she overdid her L shoulder exercises/ lifting this past weekend and was sore in L shoulder.  Pt. Has been going to gym on a  consistent 3x/week basis.  Pt. Has vacation/ cruise scheduled for next week.     There.ex.:   B UBE 2 min. F/b.  Warm-up/ discussed weekend activities.    Standing TRX shoulder flexion walk outs 10x (as tolerated).   Standing Nautilus:  seated lat pull downs with wand 50# (added sh. Flexion static holds)/ tricep extension 30#/ sh. Extension 30#/ scap. Retraction with wand 40#/ standing chest press 30# 15x2.     Reviewed standing static pec stretches.  Pt. Described a good stretch at washer/dryer that she will do at home.    Reassessment of B shoulder AROM in standing posture (all planes).      Patient to ice at home   PATIENT EDUCATION: Education details: Role of PT, PT plan of care, icing/ HEP with GTB.   Person educated: Patient Education method: Medical illustrator Education comprehension: verbalized understanding  HOME EXERCISE PROGRAM: Access Code: [email or web address] URL: [email or web address] Date: 10/26/2023 Prepared by: Dorene Grebe Exercises - Seated Scapular Retraction - 2 x daily - 7 x weekly - 1 sets - 10 reps - Seated Cervical Rotation AROM - 2 x daily - 7 x weekly - 1 sets - 10 reps - Seated Cervical Sidebending AROM - 2 x daily - 7 x weekly - 1 sets - 10 reps - Standing Circular Shoulder Pendulum Supported with Arm Bent - 2 x daily - 7 x weekly - 1 sets - 10 reps - Seated Elbow Flexion AAROM - 2 x daily - 7 x weekly - 1 sets - 10 reps - Seated AAROM Elbow Supination/Pronation with Clasped Hands - 2 x daily - 7 x weekly - 1 sets - 10 reps  Access Code: [email or web address] URL: [email or web address] Date: 10/28/2023 Prepared by: Dorene Grebe  Exercises - Seated Scapular Retraction  - 2 x daily - 7 x weekly - 1 sets - 10 reps - Seated Cervical Rotation AROM  - 2 x daily - 7 x weekly - 1 sets - 10 reps - Seated Cervical Sidebending AROM  - 2 x daily - 7 x weekly - 1 sets - 10 reps - Standing Circular Shoulder Pendulum Supported with Arm Bent  - 2 x daily  - 7 x weekly - 1 sets - 10 reps - Seated Elbow Flexion AAROM  - 2 x daily - 7 x weekly - 1 sets - 10 reps - Seated Shoulder Flexion Self PROM  - 3 x daily - 7 x weekly - 1 sets - 10 reps -  5-10 second hold - Supine Shoulder External Rotation with Dowel  - 3 x daily - 7 x weekly - 1 sets - 10 reps - 5-10 seconds hold  Access Code: [email or web address] URL: [email or web address] Date: 11/16/2023 Prepared by: Dorene Grebe  Exercises - Seated Cervical Sidebending AROM  - 2 x daily - 7 x weekly - 1 sets - 10 reps - Circular Shoulder Pendulum with Table Support  - 2 x daily - 7 x weekly - 1 sets - 10 reps - Seated Shoulder Flexion AAROM with Pulley Behind  - 2 x daily - 7 x weekly - 1 sets - 20 reps - Seated Shoulder Scaption AAROM with Pulley at Side  - 2 x daily - 7 x weekly - 1 sets - 20 reps - Seated Shoulder Abduction AAROM with Pulley Behind  - 2 x daily - 7 x weekly - 1 sets - 20 reps - Supine Shoulder External Rotation in 45 Degrees Abduction AAROM with Dowel  - 2 x daily - 7 x weekly - 1 sets - 10 reps  Access Code: [email or web address] URL: [email or web address] Date: 12/12/2023 Prepared by: Dorene Grebe  Exercises - Seated Shoulder Flexion AAROM with Pulley Behind  - 2 x daily - 7 x weekly - 1 sets - 20 reps - Seated Shoulder Scaption AAROM with Pulley at Side  - 2 x daily - 7 x weekly - 1 sets - 20 reps - Seated Shoulder Abduction AAROM with Pulley Behind  - 2 x daily - 7 x weekly - 1 sets - 20 reps - Standing Shoulder Flexion Wall Walk  - 2 x daily - 7 x weekly - 2 sets - 10 reps - Sidelying Shoulder External Rotation  - 2 x daily - 7 x weekly - 2 sets - 10 reps - Supine Pectoralis Stretch  - 1 x daily - 7 x weekly - 1 sets - 10 reps - Standing Isometric Shoulder External Rotation with Doorway  - 1 x daily - 7 x weekly - 2 sets - 10 reps - 5 seconds hold - Standing Isometric Shoulder Flexion with Doorway - Arm Bent  - 1 x daily - 7 x weekly - 2 sets - 10 reps - 5 seconds  hold.  Access Code: [email or web address] URL: [email or web address] Date: 01/19/2024 Prepared by: Dorene Grebe  Exercises - Shoulder W - External Rotation with Resistance  - 1 x daily - 3-4 x weekly - 3 sets - 10 reps - Shoulder extension with resistance - Neutral  - 1 x daily - 3-4 x weekly - 3 sets - 10 reps - Doorway Pec Stretch at 60 Elevation  - 1 x daily - 3-4 x weekly - 3 sets - 10 reps - Squatting High Shoulder Row with Resistance  - 1 x daily - 3-4 x weekly - 3 sets - 10 reps - Standing Tricep Extensions with Resistance  - 1 x daily - 3-4 x weekly - 3 sets - 10 reps - Standing Upright Shoulder Row with Resistance  - 1 x daily - 3-4 x weekly - 3 sets - 10 reps - Standing Single Arm Bicep Curls Supinated with Dumbbell  - 1 x daily - 3-4 x weekly - 3 sets - 10 reps  ASSESSMENT:  CLINICAL IMPRESSION:    PT tx. session focused on L shoulder strengthening and posture correction.  Pt. Continues to progress with L shoulder AROM/ MMT over past month.   Pt. with general L shoulder soreness with STM and movement.  Pt. Remains  limited B pec stretches and instructed on seated/ standing ex.  Pt. Will continue with more independent HEP over next week while on vacation and PT will reassess goals upon return.   Pt. Has progressed to aquatic/ gym based ex. At local fitness center.   Pt will benefit from continued PT services with a decrease tx. Frequency to increase L shoulder ROM/ stability to improve pain-free mobility.    OBJECTIVE IMPAIRMENTS: decreased ROM, decreased strength, hypomobility, impaired UE functional use, and pain.   ACTIVITY LIMITATIONS: carrying, bathing, dressing, reach over head, and hygiene/grooming  PARTICIPATION LIMITATIONS: meal prep, cleaning, laundry, driving, community activity, occupation, and yard work  PERSONAL FACTORS: Age and 1 comorbidity: adhesive capsulitis  are also affecting patient's functional outcome.   REHAB POTENTIAL: Good  CLINICAL DECISION  MAKING: Stable/uncomplicated  EVALUATION COMPLEXITY: Low  GOALS:  LONG TERM GOALS: Target date: 02/14/2024  Pt will improve FOTO score to at least 63 in order to demonstrate improvement in pain and function with ADL's. Baseline: 41.  12/9: 42.  2/4: 59 Goal status: Partially met  2.  Pt will improve L shoulder MMT scores to at least 4/5 to demonstrate improvement in L shoulder strength. Baseline: Not tested, to be assessed when appropriate going by protocol (post-op month 3 = January 31st) Goal status: Not met  3.  Pt will report <2/10 pain on NPS scale in order to show clinically significant improvement in pain at rest and with mobility. Baseline: 7/10 pain at rest.  12/9: 3-5/10 (slight increase in L shoulder pain this past weekend).   Goal status: Not met  PLAN:  PT FREQUENCY: 2x/week  PT DURATION: 4 weeks  PLANNED INTERVENTIONS: 97164- PT Re-evaluation, 97110-Therapeutic exercises, 97530- Therapeutic activity, 97112- Neuromuscular re-education, 97140- Manual therapy, Joint mobilization, Cryotherapy, and Moist heat  PLAN FOR NEXT SESSION:  Progress UE strengthening/ functional reaching.  CHECK GOALS/ RECERT vs. DISCHARGE  Cammie Mcgee, PT, DPT # [phone] Physical Therapist - [place]  [facility] 11:45 AM,02/14/24

## 2024-02-29 ENCOUNTER — Encounter: Payer: Self-pay | Admitting: Physical Therapy

## 2024-02-29 ENCOUNTER — Ambulatory Visit: Payer: Medicare Other | Admitting: Physical Therapy

## 2024-02-29 DIAGNOSIS — Z9889 Other specified postprocedural states: Secondary | ICD-10-CM | POA: Diagnosis not present

## 2024-02-29 DIAGNOSIS — M25612 Stiffness of left shoulder, not elsewhere classified: Secondary | ICD-10-CM

## 2024-02-29 DIAGNOSIS — G8929 Other chronic pain: Secondary | ICD-10-CM

## 2024-02-29 NOTE — Therapy (Signed)
 OUTPATIENT PHYSICAL THERAPY SHOULDER TREATMENT/ RECERTIFICATION  Patient Name: Hannah Cross MRN: [id number] DOB:1956/03/19, 68 y.o., female Today's Date: 02/29/2024  END OF SESSION:  PT End of Session - 03/03/24 1846     Visit Number 25    Number of Visits 33    Date for PT Re-Evaluation 03/28/24    PT Start Time 1028    PT Stop Time 1115    PT Time Calculation (min) 47 min    Activity Tolerance --    Behavior During Therapy --             Past Surgical History:  Procedure Laterality Date   ABDOMINAL HYSTERECTOMY     OOPHORECTOMY     Patient Active Problem List   Diagnosis Date Noted   Syncope 08/06/2019    PCP: Titus Mould, NP  REFERRING PROVIDER: Fletcher Anon, MD  REFERRING DIAG: Incomplete rotator cuff tear or rupture of left shoulder, not specified as traumatic; adhesive capsulitis of left shoulder  THERAPY DIAG:  S/P arthroscopy of left shoulder  Chronic left shoulder pain  Stiffness of left shoulder, not elsewhere classified  S/P left rotator cuff repair  Rationale for Evaluation and Treatment: Rehabilitation  ONSET DATE: 10/13/2023    SUBJECTIVE:                                                                                                                                                                                      SUBJECTIVE STATEMENT: Pt reports to PT s/p L shoulder arthroscopy with rotator cuff repair, extensive debridement, tenodesis of long tendon of biceps, decompression of subacromial space with partial acromioplasty and distal claviculectomy (10/13/2023). Pt reports dealing with chronic bilateral shoulder pain for most of this year and received injections in both shoulders (L side in July); pt reported improvement in pain with her R shoulder but no improvement with her left, which is what resulted in her having the surgery. She says her pain is usually worse at night and she is having trouble sleeping  because of the pain. Pt reports all her sensation has returned since the numbing block wore off earlier this week.  Hand dominance: Right  PERTINENT HISTORY: Adhesive capsulitis, stress fx of L fibula (September 2024)  PAIN:  Are you having pain? Yes: NPRS scale: 7/10 now, 9/10 worst, 2/10 best Pain location: L shoulder Pain description: Sharp, burning Aggravating factors: Movement, having shoulder at rest without support Relieving factors: Ibuprofen  PRECAUTIONS: Shoulder  RED FLAGS: None   WEIGHT BEARING RESTRICTIONS: Yes NWB LUE for first 6 weeks after surgery  FALLS:  Has patient fallen in last 6 months? No  LIVING ENVIRONMENT: Lives  with: lives with their spouse Lives in: House/apartment Stairs: Yes: External: 3 steps; none Has following equipment at home: None  OCCUPATION: Safety Specialist  PLOF: Independent  PATIENT GOALS:Get normal ROM back with no pain, get back to cooking for her family  NEXT MD VISIT: TBD  OBJECTIVE:  Note: Objective measures were completed at Evaluation unless otherwise noted.  DIAGNOSTIC FINDINGS:  N/A  PATIENT SURVEYS:  FOTO 41/63  COGNITION: Overall cognitive status: Within functional limits for tasks assessed     SENSATION: Not tested (pt reports sensation has returned since numbing block wore off)  POSTURE: No significant postural limitations noted  UPPER EXTREMITY ROM:   Passive/Active ROM Right Eval (Active)/In sitting Left eval  Shoulder flexion 166 90 (abiding by protocol)  Shoulder extension 75   Shoulder abduction 160   Shoulder adduction    Shoulder internal rotation [facility]   Shoulder external rotation WFL ~10 degrees (abiding by protocol)  Elbow flexion [facility] WFL  Elbow extension [facility] WFL  Wrist flexion    Wrist extension    Wrist ulnar deviation    Wrist radial deviation    Wrist pronation    Wrist supination    (Blank rows = not tested)  UPPER EXTREMITY MMT:  MMT Right eval Left eval  Shoulder  flexion 5   Shoulder extension 5   Shoulder abduction 5   Shoulder adduction    Shoulder internal rotation 5   Shoulder external rotation 4   Middle trapezius    Lower trapezius    Elbow flexion    Elbow extension    Wrist flexion    Wrist extension    Wrist ulnar deviation    Wrist radial deviation    Wrist pronation    Wrist supination    Grip strength (lbs) 36.9 lbs average   (Blank rows = not tested)  12/17: Grip strength: R 51#, L 32#.     UPPER EXTREMITY ROM: Reassessment of B shoulder AROM   Active ROM Right  (Active)/In sitting Left   Shoulder flexion 168 deg. 145 deg.  Shoulder extension 65 deg. 45 deg.  Shoulder abduction 158 deg. 131 deg.   Shoulder adduction      Shoulder internal rotation 82 deg. 58 deg.   Shoulder external rotation 75 deg. 64 deg.  Elbow flexion [facility] WFL  Elbow extension [facility] WFL  Wrist flexion      Wrist extension      Wrist ulnar deviation      Wrist radial deviation      Wrist pronation      Wrist supination      (Blank rows = not tested)   UPPER EXTREMITY MMT: Reassessment of B shoulder MMT   MMT Right eval Left eval  Shoulder flexion 4-  4  Shoulder extension 4-  3  Shoulder abduction 4  3+  Shoulder adduction      Shoulder internal rotation 5  4  Shoulder external rotation 4-  3+  Middle trapezius  3  4  Lower trapezius      Elbow flexion      Elbow extension      Wrist flexion      Wrist extension      Wrist ulnar deviation      Wrist radial deviation      Wrist pronation      Wrist supination      Grip strength (lbs) 59.8#  44.6#  (Blank rows = not tested)  TODAY'S TREATMENT:  DATE: 02/29/2024    Subjective:  Pt. Returns to [place] on 4/15.  Pt. Needs to be able to lift/ carry a 50# box of parts and be able to push a heavy cart in order to return to work.   Pt. Had a great time on  vacation.  No new complaints.      There.ex.:   Reassessment of L sh. AROM (all planes).   Slight L mid-deltoid soreness.    See updated goals.    Nautilus:  seated lat pull downs with wand 50# (added sh. Flexion static holds)/ tricep extension 30#/ scap. Retraction with wand 40#/ standing chest press 30# 15x2.     There.act.:  60# sled push/pull in gym (multiple reps).  Box lifting: 30# with 15 foot carry (waist level).    Overhead lifting at shelf 7-17# with handles.       Patient to ice at home   NOT TODAY Standing TRX shoulder flexion walk outs 10x (as tolerated).   PATIENT EDUCATION: Education details: Role of PT, PT plan of care, icing/ HEP with GTB.   Person educated: Patient Education method: Medical illustrator Education comprehension: verbalized understanding  HOME EXERCISE PROGRAM: Access Code: [email or web address] URL: [email or web address] Date: 10/26/2023 Prepared by: Dorene Grebe Exercises - Seated Scapular Retraction - 2 x daily - 7 x weekly - 1 sets - 10 reps - Seated Cervical Rotation AROM - 2 x daily - 7 x weekly - 1 sets - 10 reps - Seated Cervical Sidebending AROM - 2 x daily - 7 x weekly - 1 sets - 10 reps - Standing Circular Shoulder Pendulum Supported with Arm Bent - 2 x daily - 7 x weekly - 1 sets - 10 reps - Seated Elbow Flexion AAROM - 2 x daily - 7 x weekly - 1 sets - 10 reps - Seated AAROM Elbow Supination/Pronation with Clasped Hands - 2 x daily - 7 x weekly - 1 sets - 10 reps  Access Code: [email or web address] URL: [email or web address] Date: 10/28/2023 Prepared by: Dorene Grebe  Exercises - Seated Scapular Retraction  - 2 x daily - 7 x weekly - 1 sets - 10 reps - Seated Cervical Rotation AROM  - 2 x daily - 7 x weekly - 1 sets - 10 reps - Seated Cervical Sidebending AROM  - 2 x daily - 7 x weekly - 1 sets - 10 reps - Standing Circular Shoulder Pendulum Supported with Arm Bent  - 2 x daily - 7 x weekly - 1 sets - 10 reps -  Seated Elbow Flexion AAROM  - 2 x daily - 7 x weekly - 1 sets - 10 reps - Seated Shoulder Flexion Self PROM  - 3 x daily - 7 x weekly - 1 sets - 10 reps - 5-10 second hold - Supine Shoulder External Rotation with Dowel  - 3 x daily - 7 x weekly - 1 sets - 10 reps - 5-10 seconds hold  Access Code: [email or web address] URL: [email or web address] Date: 11/16/2023 Prepared by: Dorene Grebe  Exercises - Seated Cervical Sidebending AROM  - 2 x daily - 7 x weekly - 1 sets - 10 reps - Circular Shoulder Pendulum with Table Support  - 2 x daily - 7 x weekly - 1 sets - 10 reps - Seated Shoulder Flexion AAROM with Pulley Behind  - 2 x daily - 7 x weekly - 1 sets - 20 reps - Seated Shoulder Scaption AAROM with Pulley at Side  - 2 x daily - 7  x weekly - 1 sets - 20 reps - Seated Shoulder Abduction AAROM with Pulley Behind  - 2 x daily - 7 x weekly - 1 sets - 20 reps - Supine Shoulder External Rotation in 45 Degrees Abduction AAROM with Dowel  - 2 x daily - 7 x weekly - 1 sets - 10 reps  Access Code: [email or web address] URL: [email or web address] Date: 12/12/2023 Prepared by: Dorene Grebe  Exercises - Seated Shoulder Flexion AAROM with Pulley Behind  - 2 x daily - 7 x weekly - 1 sets - 20 reps - Seated Shoulder Scaption AAROM with Pulley at Side  - 2 x daily - 7 x weekly - 1 sets - 20 reps - Seated Shoulder Abduction AAROM with Pulley Behind  - 2 x daily - 7 x weekly - 1 sets - 20 reps - Standing Shoulder Flexion Wall Walk  - 2 x daily - 7 x weekly - 2 sets - 10 reps - Sidelying Shoulder External Rotation  - 2 x daily - 7 x weekly - 2 sets - 10 reps - Supine Pectoralis Stretch  - 1 x daily - 7 x weekly - 1 sets - 10 reps - Standing Isometric Shoulder External Rotation with Doorway  - 1 x daily - 7 x weekly - 2 sets - 10 reps - 5 seconds hold - Standing Isometric Shoulder Flexion with Doorway - Arm Bent  - 1 x daily - 7 x weekly - 2 sets - 10 reps - 5 seconds hold.  Access Code: [email or web address] URL:  [email or web address] Date: 01/19/2024 Prepared by: Dorene Grebe  Exercises - Shoulder W - External Rotation with Resistance  - 1 x daily - 3-4 x weekly - 3 sets - 10 reps - Shoulder extension with resistance - Neutral  - 1 x daily - 3-4 x weekly - 3 sets - 10 reps - Doorway Pec Stretch at 60 Elevation  - 1 x daily - 3-4 x weekly - 3 sets - 10 reps - Squatting High Shoulder Row with Resistance  - 1 x daily - 3-4 x weekly - 3 sets - 10 reps - Standing Tricep Extensions with Resistance  - 1 x daily - 3-4 x weekly - 3 sets - 10 reps - Standing Upright Shoulder Row with Resistance  - 1 x daily - 3-4 x weekly - 3 sets - 10 reps - Standing Single Arm Bicep Curls Supinated with Dumbbell  - 1 x daily - 3-4 x weekly - 3 sets - 10 reps  ASSESSMENT:  CLINICAL IMPRESSION:    PT tx. session focused on L shoulder strengthening and posture correction.  Pt. Continues to progress with L shoulder AROM/ MMT over past month.   Pt. with general L shoulder soreness with STM and movement.  Pt. Remains limited B pec stretches and instructed on seated/ standing ex.  Pt. Will continue with more independent HEP over next week while on vacation and PT will reassess goals upon return.   Pt. Has progressed to aquatic/ gym based ex. At local fitness center.   Pt will benefit from continued PT services with a decrease tx. Frequency to increase L shoulder ROM/ stability to improve pain-free mobility.    OBJECTIVE IMPAIRMENTS: decreased ROM, decreased strength, hypomobility, impaired UE functional use, and pain.   ACTIVITY LIMITATIONS: carrying, bathing, dressing, reach over head, and hygiene/grooming  PARTICIPATION LIMITATIONS: meal prep, cleaning, laundry, driving, community activity, occupation, and yard work  PERSONAL FACTORS: Age and 1 comorbidity: adhesive capsulitis  are also affecting  patient's functional outcome.   REHAB POTENTIAL: Good  CLINICAL DECISION MAKING: Stable/uncomplicated  EVALUATION  COMPLEXITY: Low  GOALS:  LONG TERM GOALS: Target date: 03/28/2024  Pt able to safely lift 50# box from waist to eye level with no sh. Pain or limitations to promote return to work.  Baseline: 30# box lift/ carry with good technique.   Goal status: Initial  2.  Pt will improve L shoulder MMT scores to at least 4/5 to demonstrate improvement in L shoulder strength. Baseline: See above Goal status: Partially met  3.  Pt will report <2/10 pain on NPS scale in order to show clinically significant improvement in pain at rest and with mobility. Baseline: increase shoulder discomfort with overhead lifting Goal status: Partially met  PLAN:  PT FREQUENCY: 2x/week  PT DURATION: 4 weeks  PLANNED INTERVENTIONS: 97164- PT Re-evaluation, 97110-Therapeutic exercises, 97530- Therapeutic activity, 97112- Neuromuscular re-education, 97140- Manual therapy, Joint mobilization, Cryotherapy, and Moist heat  PLAN FOR NEXT SESSION:  Progress UE strengthening/ functional reaching.  Work-related tasks  Cammie Mcgee, PT, DPT # [phone] Physical Therapist - [place]  [facility] 6:48 PM,03/03/24

## 2024-03-05 ENCOUNTER — Ambulatory Visit: Admitting: Physical Therapy

## 2024-03-05 ENCOUNTER — Encounter: Payer: Self-pay | Admitting: Physical Therapy

## 2024-03-05 DIAGNOSIS — G8929 Other chronic pain: Secondary | ICD-10-CM

## 2024-03-05 DIAGNOSIS — M25612 Stiffness of left shoulder, not elsewhere classified: Secondary | ICD-10-CM

## 2024-03-05 DIAGNOSIS — Z9889 Other specified postprocedural states: Secondary | ICD-10-CM

## 2024-03-05 NOTE — Therapy (Signed)
 OUTPATIENT PHYSICAL THERAPY SHOULDER TREATMENT  Patient Name: Hannah Cross MRN: [id number] DOB:[birth year], 68 y.o., female Today's Date: 03/05/2024  END OF SESSION:  PT End of Session - 03/05/24 1259     Visit Number 26    Number of Visits 33    Date for PT Re-Evaluation 03/28/24    PT Start Time 1300    PT Stop Time 1339    PT Time Calculation (min) 39 min             Past Surgical History:  Procedure Laterality Date   ABDOMINAL HYSTERECTOMY     OOPHORECTOMY     Patient Active Problem List   Diagnosis Date Noted   Syncope 08/06/2019    PCP: Titus Mould, NP  REFERRING PROVIDER: Fletcher Anon, MD  REFERRING DIAG: Incomplete rotator cuff tear or rupture of left shoulder, not specified as traumatic; adhesive capsulitis of left shoulder  THERAPY DIAG:  S/P arthroscopy of left shoulder  Chronic left shoulder pain  Stiffness of left shoulder, not elsewhere classified  S/P left rotator cuff repair  Rationale for Evaluation and Treatment: Rehabilitation  ONSET DATE: 10/13/2023    SUBJECTIVE:                                                                                                                                                                                      SUBJECTIVE STATEMENT: Pt reports to PT s/p L shoulder arthroscopy with rotator cuff repair, extensive debridement, tenodesis of long tendon of biceps, decompression of subacromial space with partial acromioplasty and distal claviculectomy (10/13/2023). Pt reports dealing with chronic bilateral shoulder pain for most of this year and received injections in both shoulders (L side in July); pt reported improvement in pain with her R shoulder but no improvement with her left, which is what resulted in her having the surgery. She says her pain is usually worse at night and she is having trouble sleeping because of the pain. Pt reports all her sensation has returned since the  numbing block wore off earlier this week.  Hand dominance: Right  PERTINENT HISTORY: Adhesive capsulitis, stress fx of L fibula (September 2024)  PAIN:  Are you having pain? Yes: NPRS scale: 7/10 now, 9/10 worst, 2/10 best Pain location: L shoulder Pain description: Sharp, burning Aggravating factors: Movement, having shoulder at rest without support Relieving factors: Ibuprofen  PRECAUTIONS: Shoulder  RED FLAGS: None   WEIGHT BEARING RESTRICTIONS: Yes NWB LUE for first 6 weeks after surgery  FALLS:  Has patient fallen in last 6 months? No  LIVING ENVIRONMENT: Lives with: lives with their spouse Lives in: House/apartment Stairs: Yes: External: 3 steps; none  Has following equipment at home: None  OCCUPATION: Safety Specialist  PLOF: Independent  PATIENT GOALS:Get normal ROM back with no pain, get back to cooking for her family  NEXT MD VISIT: TBD  OBJECTIVE:  Note: Objective measures were completed at Evaluation unless otherwise noted.  DIAGNOSTIC FINDINGS:  N/A  PATIENT SURVEYS:  FOTO 41/63  COGNITION: Overall cognitive status: Within functional limits for tasks assessed     SENSATION: Not tested (pt reports sensation has returned since numbing block wore off)  POSTURE: No significant postural limitations noted  UPPER EXTREMITY ROM:   Passive/Active ROM Right Eval (Active)/In sitting Left eval  Shoulder flexion 166 90 (abiding by protocol)  Shoulder extension 75   Shoulder abduction 160   Shoulder adduction    Shoulder internal rotation [facility]   Shoulder external rotation WFL ~10 degrees (abiding by protocol)  Elbow flexion [facility] WFL  Elbow extension [facility] WFL  Wrist flexion    Wrist extension    Wrist ulnar deviation    Wrist radial deviation    Wrist pronation    Wrist supination    (Blank rows = not tested)  UPPER EXTREMITY MMT:  MMT Right eval Left eval  Shoulder flexion 5   Shoulder extension 5   Shoulder abduction 5   Shoulder  adduction    Shoulder internal rotation 5   Shoulder external rotation 4   Middle trapezius    Lower trapezius    Elbow flexion    Elbow extension    Wrist flexion    Wrist extension    Wrist ulnar deviation    Wrist radial deviation    Wrist pronation    Wrist supination    Grip strength (lbs) 36.9 lbs average   (Blank rows = not tested)  12/17: Grip strength: R 51#, L 32#.     UPPER EXTREMITY ROM: Reassessment of B shoulder AROM   Active ROM Right  (Active)/In sitting Left   Shoulder flexion 168 deg. 145 deg.  Shoulder extension 65 deg. 45 deg.  Shoulder abduction 158 deg. 131 deg.   Shoulder adduction      Shoulder internal rotation 82 deg. 58 deg.   Shoulder external rotation 75 deg. 64 deg.  Elbow flexion [facility] WFL  Elbow extension [facility] WFL  Wrist flexion      Wrist extension      Wrist ulnar deviation      Wrist radial deviation      Wrist pronation      Wrist supination      (Blank rows = not tested)   UPPER EXTREMITY MMT: Reassessment of B shoulder MMT   MMT Right eval Left eval  Shoulder flexion 4-  4  Shoulder extension 4-  3  Shoulder abduction 4  3+  Shoulder adduction      Shoulder internal rotation 5  4  Shoulder external rotation 4-  3+  Middle trapezius  3  4  Lower trapezius      Elbow flexion      Elbow extension      Wrist flexion      Wrist extension      Wrist ulnar deviation      Wrist radial deviation      Wrist pronation      Wrist supination      Grip strength (lbs) 59.8#  44.6#  (Blank rows = not tested)  TODAY'S TREATMENT:  DATE: 03/05/2024    Subjective:  Pt. Returns to MD on 4/15.  Pt. Needs to be able to lift/ carry a 50# box of parts and be able to push a heavy cart in order to return to work.   Pt. Reports 1.5/10 L shoulder pain prior to tx. Session.     There.ex.:   B UBE 3 min. F/b.   Consistent cadence/ discussed weekend activities.    Reassessment of L sh. AROM (all planes).   Slight L mid-deltoid soreness.   Standing bodyblade at mirror: sh. Flexion/ biceps/ triceps/ IR/ ER 2x30 sec.   Nautilus:  seated lat pull downs with wand 50# (added sh. Flexion static holds)/ tricep extension 30#/ scap. Retraction with wand 50# 20x2.  Standing chest press with wand 40# 15x2.     There.act.:  70# sled push/pull in hallway (35 feet x 3).  Box lifting (floor to waist): 25# with mirror feedback/ feedback for body mechanics. Marked fatigue noted.         Patient to ice at home   NOT TODAY Standing TRX shoulder flexion walk outs 10x (as tolerated).   Overhead lifting at shelf 7-17# with handles.   PATIENT EDUCATION: Education details: Role of PT, PT plan of care, icing/ HEP with GTB.   Person educated: Patient Education method: Medical illustrator Education comprehension: verbalized understanding  HOME EXERCISE PROGRAM: Access Code: [email or web address] URL: [email or web address] Date: 10/26/2023 Prepared by: Dorene Grebe Exercises - Seated Scapular Retraction - 2 x daily - 7 x weekly - 1 sets - 10 reps - Seated Cervical Rotation AROM - 2 x daily - 7 x weekly - 1 sets - 10 reps - Seated Cervical Sidebending AROM - 2 x daily - 7 x weekly - 1 sets - 10 reps - Standing Circular Shoulder Pendulum Supported with Arm Bent - 2 x daily - 7 x weekly - 1 sets - 10 reps - Seated Elbow Flexion AAROM - 2 x daily - 7 x weekly - 1 sets - 10 reps - Seated AAROM Elbow Supination/Pronation with Clasped Hands - 2 x daily - 7 x weekly - 1 sets - 10 reps  Access Code: [email or web address] URL: [email or web address] Date: 10/28/2023 Prepared by: Dorene Grebe  Exercises - Seated Scapular Retraction  - 2 x daily - 7 x weekly - 1 sets - 10 reps - Seated Cervical Rotation AROM  - 2 x daily - 7 x weekly - 1 sets - 10 reps - Seated Cervical Sidebending AROM  - 2 x daily - 7 x weekly -  1 sets - 10 reps - Standing Circular Shoulder Pendulum Supported with Arm Bent  - 2 x daily - 7 x weekly - 1 sets - 10 reps - Seated Elbow Flexion AAROM  - 2 x daily - 7 x weekly - 1 sets - 10 reps - Seated Shoulder Flexion Self PROM  - 3 x daily - 7 x weekly - 1 sets - 10 reps - 5-10 second hold - Supine Shoulder External Rotation with Dowel  - 3 x daily - 7 x weekly - 1 sets - 10 reps - 5-10 seconds hold  Access Code: [email or web address] URL: [email or web address] Date: 11/16/2023 Prepared by: Dorene Grebe  Exercises - Seated Cervical Sidebending AROM  - 2 x daily - 7 x weekly - 1 sets - 10 reps - Circular Shoulder Pendulum with Table Support  - 2 x daily - 7 x weekly - 1 sets - 10 reps - Seated Shoulder Flexion AAROM with  Pulley Behind  - 2 x daily - 7 x weekly - 1 sets - 20 reps - Seated Shoulder Scaption AAROM with Pulley at Side  - 2 x daily - 7 x weekly - 1 sets - 20 reps - Seated Shoulder Abduction AAROM with Pulley Behind  - 2 x daily - 7 x weekly - 1 sets - 20 reps - Supine Shoulder External Rotation in 45 Degrees Abduction AAROM with Dowel  - 2 x daily - 7 x weekly - 1 sets - 10 reps  Access Code: [email or web address] URL: [email or web address] Date: 12/12/2023 Prepared by: Dorene Grebe  Exercises - Seated Shoulder Flexion AAROM with Pulley Behind  - 2 x daily - 7 x weekly - 1 sets - 20 reps - Seated Shoulder Scaption AAROM with Pulley at Side  - 2 x daily - 7 x weekly - 1 sets - 20 reps - Seated Shoulder Abduction AAROM with Pulley Behind  - 2 x daily - 7 x weekly - 1 sets - 20 reps - Standing Shoulder Flexion Wall Walk  - 2 x daily - 7 x weekly - 2 sets - 10 reps - Sidelying Shoulder External Rotation  - 2 x daily - 7 x weekly - 2 sets - 10 reps - Supine Pectoralis Stretch  - 1 x daily - 7 x weekly - 1 sets - 10 reps - Standing Isometric Shoulder External Rotation with Doorway  - 1 x daily - 7 x weekly - 2 sets - 10 reps - 5 seconds hold - Standing Isometric  Shoulder Flexion with Doorway - Arm Bent  - 1 x daily - 7 x weekly - 2 sets - 10 reps - 5 seconds hold.  Access Code: [email or web address] URL: [email or web address] Date: 01/19/2024 Prepared by: Dorene Grebe  Exercises - Shoulder W - External Rotation with Resistance  - 1 x daily - 3-4 x weekly - 3 sets - 10 reps - Shoulder extension with resistance - Neutral  - 1 x daily - 3-4 x weekly - 3 sets - 10 reps - Doorway Pec Stretch at 60 Elevation  - 1 x daily - 3-4 x weekly - 3 sets - 10 reps - Squatting High Shoulder Row with Resistance  - 1 x daily - 3-4 x weekly - 3 sets - 10 reps - Standing Tricep Extensions with Resistance  - 1 x daily - 3-4 x weekly - 3 sets - 10 reps - Standing Upright Shoulder Row with Resistance  - 1 x daily - 3-4 x weekly - 3 sets - 10 reps - Standing Single Arm Bicep Curls Supinated with Dumbbell  - 1 x daily - 3-4 x weekly - 3 sets - 10 reps  ASSESSMENT:  CLINICAL IMPRESSION:    PT tx. session focused on L shoulder strengthening and posture correction.  Pt. Continues to progress with L shoulder AROM/ strengthening.  Pt. with general L shoulder soreness with use of bodyblade/ resisted there.ex.  Pt. Remains limited B pec stretches and instructed on seated/ standing ex.  Pt. Will continue with more independent HEP and gym based/ aquatic ex.    Pt will benefit from continued PT services with a decrease tx. Frequency to increase L shoulder ROM/ stability to improve pain-free mobility.    OBJECTIVE IMPAIRMENTS: decreased ROM, decreased strength, hypomobility, impaired UE functional use, and pain.   ACTIVITY LIMITATIONS: carrying, bathing, dressing, reach over head, and hygiene/grooming  PARTICIPATION LIMITATIONS: meal prep, cleaning, laundry, driving, community activity, occupation, and yard work  PERSONAL FACTORS:  Age and 1 comorbidity: adhesive capsulitis  are also affecting patient's functional outcome.   REHAB POTENTIAL: Good  CLINICAL DECISION MAKING:  Stable/uncomplicated  EVALUATION COMPLEXITY: Low  GOALS:  LONG TERM GOALS: Target date: 03/28/2024  Pt able to safely lift 50# box from waist to eye level with no sh. Pain or limitations to promote return to work.  Baseline: 30# box lift/ carry with good technique.   Goal status: Initial  2.  Pt will improve L shoulder MMT scores to at least 4/5 to demonstrate improvement in L shoulder strength. Baseline: See above Goal status: Partially met  3.  Pt will report <2/10 pain on NPS scale in order to show clinically significant improvement in pain at rest and with mobility. Baseline: increase shoulder discomfort with overhead lifting Goal status: Partially met  PLAN:  PT FREQUENCY: 2x/week  PT DURATION: 4 weeks  PLANNED INTERVENTIONS: 97164- PT Re-evaluation, 97110-Therapeutic exercises, 97530- Therapeutic activity, 97112- Neuromuscular re-education, 97140- Manual therapy, Joint mobilization, Cryotherapy, and Moist heat  PLAN FOR NEXT SESSION:  Progress UE strengthening/ functional reaching.  Work-related tasks  Cammie Mcgee, PT, DPT # [phone] Physical Therapist - [place]  [facility] 1:40 PM,03/05/24

## 2024-03-07 ENCOUNTER — Ambulatory Visit: Admitting: Physical Therapy

## 2024-03-12 ENCOUNTER — Encounter: Payer: Self-pay | Admitting: Physical Therapy

## 2024-03-13 ENCOUNTER — Ambulatory Visit: Admitting: Physical Therapy

## 2024-03-15 ENCOUNTER — Ambulatory Visit: Attending: Hand Surgery | Admitting: Physical Therapy

## 2024-03-15 ENCOUNTER — Encounter: Admitting: Physical Therapy

## 2024-03-15 ENCOUNTER — Encounter: Payer: Self-pay | Admitting: Physical Therapy

## 2024-03-15 DIAGNOSIS — M25612 Stiffness of left shoulder, not elsewhere classified: Secondary | ICD-10-CM | POA: Insufficient documentation

## 2024-03-15 DIAGNOSIS — Z9889 Other specified postprocedural states: Secondary | ICD-10-CM | POA: Insufficient documentation

## 2024-03-15 DIAGNOSIS — M25512 Pain in left shoulder: Secondary | ICD-10-CM | POA: Diagnosis present

## 2024-03-15 DIAGNOSIS — G8929 Other chronic pain: Secondary | ICD-10-CM | POA: Diagnosis present

## 2024-03-15 NOTE — Therapy (Signed)
 OUTPATIENT PHYSICAL THERAPY SHOULDER TREATMENT  Patient Name: Hannah Cross MRN: [id number] DOB:1956-04-05, 68 y.o., female Today's Date: 03/15/2024  END OF SESSION:  PT End of Session - 03/15/24 0843     Visit Number 27    Number of Visits 33    Date for PT Re-Evaluation 03/28/24    PT Start Time 0858    PT Stop Time 0945    PT Time Calculation (min) 47 min             Past Surgical History:  Procedure Laterality Date   ABDOMINAL HYSTERECTOMY     OOPHORECTOMY     Patient Active Problem List   Diagnosis Date Noted   Syncope 08/06/2019    PCP: Titus Mould, NP  REFERRING PROVIDER: Fletcher Anon, MD  REFERRING DIAG: Incomplete rotator cuff tear or rupture of left shoulder, not specified as traumatic; adhesive capsulitis of left shoulder  THERAPY DIAG:  S/P arthroscopy of left shoulder  Chronic left shoulder pain  Stiffness of left shoulder, not elsewhere classified  S/P left rotator cuff repair  Rationale for Evaluation and Treatment: Rehabilitation  ONSET DATE: 10/13/2023    SUBJECTIVE:                                                                                                                                                                                      SUBJECTIVE STATEMENT: Pt reports to PT s/p L shoulder arthroscopy with rotator cuff repair, extensive debridement, tenodesis of long tendon of biceps, decompression of subacromial space with partial acromioplasty and distal claviculectomy (10/13/2023). Pt reports dealing with chronic bilateral shoulder pain for most of this year and received injections in both shoulders (L side in July); pt reported improvement in pain with her R shoulder but no improvement with her left, which is what resulted in her having the surgery. She says her pain is usually worse at night and she is having trouble sleeping because of the pain. Pt reports all her sensation has returned since the numbing  block wore off earlier this week.  Hand dominance: Right  PERTINENT HISTORY: Adhesive capsulitis, stress fx of L fibula (September 2024)  PAIN:  Are you having pain? Yes: NPRS scale: 7/10 now, 9/10 worst, 2/10 best Pain location: L shoulder Pain description: Sharp, burning Aggravating factors: Movement, having shoulder at rest without support Relieving factors: Ibuprofen  PRECAUTIONS: Shoulder  RED FLAGS: None   WEIGHT BEARING RESTRICTIONS: Yes NWB LUE for first 6 weeks after surgery  FALLS:  Has patient fallen in last 6 months? No  LIVING ENVIRONMENT: Lives with: lives with their spouse Lives in: House/apartment Stairs: Yes: External: 3 steps; none  Has following equipment at home: None  OCCUPATION: Safety Specialist  PLOF: Independent  PATIENT GOALS:Get normal ROM back with no pain, get back to cooking for her family  NEXT MD VISIT: TBD  OBJECTIVE:  Note: Objective measures were completed at Evaluation unless otherwise noted.  DIAGNOSTIC FINDINGS:  N/A  PATIENT SURVEYS:  FOTO 41/63  COGNITION: Overall cognitive status: Within functional limits for tasks assessed     SENSATION: Not tested (pt reports sensation has returned since numbing block wore off)  POSTURE: No significant postural limitations noted  UPPER EXTREMITY ROM:   Passive/Active ROM Right Eval (Active)/In sitting Left eval  Shoulder flexion 166 90 (abiding by protocol)  Shoulder extension 75   Shoulder abduction 160   Shoulder adduction    Shoulder internal rotation [facility]   Shoulder external rotation WFL ~10 degrees (abiding by protocol)  Elbow flexion [facility] WFL  Elbow extension [facility] WFL  Wrist flexion    Wrist extension    Wrist ulnar deviation    Wrist radial deviation    Wrist pronation    Wrist supination    (Blank rows = not tested)  UPPER EXTREMITY MMT:  MMT Right eval Left eval  Shoulder flexion 5   Shoulder extension 5   Shoulder abduction 5   Shoulder adduction     Shoulder internal rotation 5   Shoulder external rotation 4   Middle trapezius    Lower trapezius    Elbow flexion    Elbow extension    Wrist flexion    Wrist extension    Wrist ulnar deviation    Wrist radial deviation    Wrist pronation    Wrist supination    Grip strength (lbs) 36.9 lbs average   (Blank rows = not tested)  12/17: Grip strength: R 51#, L 32#.     UPPER EXTREMITY ROM: Reassessment of B shoulder AROM   Active ROM Right  (Active)/In sitting Left   Shoulder flexion 168 deg. 145 deg.  Shoulder extension 65 deg. 45 deg.  Shoulder abduction 158 deg. 131 deg.   Shoulder adduction      Shoulder internal rotation 82 deg. 58 deg.   Shoulder external rotation 75 deg. 64 deg.  Elbow flexion [facility] WFL  Elbow extension [facility] WFL  Wrist flexion      Wrist extension      Wrist ulnar deviation      Wrist radial deviation      Wrist pronation      Wrist supination      (Blank rows = not tested)   UPPER EXTREMITY MMT: Reassessment of B shoulder MMT   MMT Right eval Left eval  Shoulder flexion 4-  4  Shoulder extension 4-  3  Shoulder abduction 4  3+  Shoulder adduction      Shoulder internal rotation 5  4  Shoulder external rotation 4-  3+  Middle trapezius  3  4  Lower trapezius      Elbow flexion      Elbow extension      Wrist flexion      Wrist extension      Wrist ulnar deviation      Wrist radial deviation      Wrist pronation      Wrist supination      Grip strength (lbs) 59.8#  44.6#  (Blank rows = not tested)  TODAY'S TREATMENT:  DATE: 03/15/2024    Subjective:  Pt. Has been sore in L shoulder since last PT tx. Session.  PT put pts. Resisted ex. On hold with PT over past week secondary to flare up in shoulder pain/ inflammation.  Pt. Has not been to gym this week.  Pt. Returns to MD on 4/15.  Pt. Needs to be able  to lift/ carry a 50# box of parts and be able to push a heavy cart in order to return to work.      There.ex.:   Reassessment of L sh. AROM (all planes).  Good L sh. Flexion/ abduction with discomfort during overhead reaching.    Supine L shoulder manual isometrics (all planes)- pain tolerable resistance/ holds.  Limited with IR/ER.    Manual tx.:  Supine L shoulder AA/PROM all planes in a pain tolerable range with holds.   STM to L shoulder/ UT/ biceps/ triceps.  L anterior to mid-deltoid/ proximal biceps soreness.  No trigger points noted.    Supine L shoulder AP/PA/inferior grade II-III mobs 3x20 sec. (As tolerated/ varying hand positions to decrease tenderness).    Ice to L shoulder in sitting after tx.  NOT TODAY Standing TRX shoulder flexion walk outs 10x (as tolerated).   Overhead lifting at shelf 7-17# with handles.   Standing bodyblade at mirror: sh. Flexion/ biceps/ triceps/ IR/ ER 2x30 sec.   Nautilus:  seated lat pull downs with wand 50# (added sh. Flexion static holds)/ tricep extension 30#/ scap. Retraction with wand 50# 20x2.  Standing chest press with wand 40# 15x2.     There.act.:  70# sled push/pull in hallway (35 feet x 3).  Box lifting (floor to waist): 25# with mirror feedback/ feedback for body mechanics. Marked fatigue noted.      PATIENT EDUCATION: Education details: Role of PT, PT plan of care, icing/ HEP with GTB.   Person educated: Patient Education method: Medical illustrator Education comprehension: verbalized understanding  HOME EXERCISE PROGRAM: Access Code: [email or web address] URL: [email or web address] Date: 10/26/2023 Prepared by: Dorene Grebe Exercises - Seated Scapular Retraction - 2 x daily - 7 x weekly - 1 sets - 10 reps - Seated Cervical Rotation AROM - 2 x daily - 7 x weekly - 1 sets - 10 reps - Seated Cervical Sidebending AROM - 2 x daily - 7 x weekly - 1 sets - 10 reps - Standing Circular Shoulder Pendulum Supported with  Arm Bent - 2 x daily - 7 x weekly - 1 sets - 10 reps - Seated Elbow Flexion AAROM - 2 x daily - 7 x weekly - 1 sets - 10 reps - Seated AAROM Elbow Supination/Pronation with Clasped Hands - 2 x daily - 7 x weekly - 1 sets - 10 reps  Access Code: [email or web address] URL: [email or web address] Date: 10/28/2023 Prepared by: Dorene Grebe  Exercises - Seated Scapular Retraction  - 2 x daily - 7 x weekly - 1 sets - 10 reps - Seated Cervical Rotation AROM  - 2 x daily - 7 x weekly - 1 sets - 10 reps - Seated Cervical Sidebending AROM  - 2 x daily - 7 x weekly - 1 sets - 10 reps - Standing Circular Shoulder Pendulum Supported with Arm Bent  - 2 x daily - 7 x weekly - 1 sets - 10 reps - Seated Elbow Flexion AAROM  - 2 x daily - 7 x weekly - 1 sets - 10 reps - Seated Shoulder Flexion Self PROM  - 3 x daily -  7 x weekly - 1 sets - 10 reps - 5-10 second hold - Supine Shoulder External Rotation with Dowel  - 3 x daily - 7 x weekly - 1 sets - 10 reps - 5-10 seconds hold  Access Code: [email or web address] URL: [email or web address] Date: 11/16/2023 Prepared by: Dorene Grebe  Exercises - Seated Cervical Sidebending AROM  - 2 x daily - 7 x weekly - 1 sets - 10 reps - Circular Shoulder Pendulum with Table Support  - 2 x daily - 7 x weekly - 1 sets - 10 reps - Seated Shoulder Flexion AAROM with Pulley Behind  - 2 x daily - 7 x weekly - 1 sets - 20 reps - Seated Shoulder Scaption AAROM with Pulley at Side  - 2 x daily - 7 x weekly - 1 sets - 20 reps - Seated Shoulder Abduction AAROM with Pulley Behind  - 2 x daily - 7 x weekly - 1 sets - 20 reps - Supine Shoulder External Rotation in 45 Degrees Abduction AAROM with Dowel  - 2 x daily - 7 x weekly - 1 sets - 10 reps  Access Code: [email or web address] URL: [email or web address] Date: 12/12/2023 Prepared by: Dorene Grebe  Exercises - Seated Shoulder Flexion AAROM with Pulley Behind  - 2 x daily - 7 x weekly - 1 sets - 20 reps - Seated Shoulder  Scaption AAROM with Pulley at Side  - 2 x daily - 7 x weekly - 1 sets - 20 reps - Seated Shoulder Abduction AAROM with Pulley Behind  - 2 x daily - 7 x weekly - 1 sets - 20 reps - Standing Shoulder Flexion Wall Walk  - 2 x daily - 7 x weekly - 2 sets - 10 reps - Sidelying Shoulder External Rotation  - 2 x daily - 7 x weekly - 2 sets - 10 reps - Supine Pectoralis Stretch  - 1 x daily - 7 x weekly - 1 sets - 10 reps - Standing Isometric Shoulder External Rotation with Doorway  - 1 x daily - 7 x weekly - 2 sets - 10 reps - 5 seconds hold - Standing Isometric Shoulder Flexion with Doorway - Arm Bent  - 1 x daily - 7 x weekly - 2 sets - 10 reps - 5 seconds hold.  Access Code: [email or web address] URL: [email or web address] Date: 01/19/2024 Prepared by: Dorene Grebe  Exercises - Shoulder W - External Rotation with Resistance  - 1 x daily - 3-4 x weekly - 3 sets - 10 reps - Shoulder extension with resistance - Neutral  - 1 x daily - 3-4 x weekly - 3 sets - 10 reps - Doorway Pec Stretch at 60 Elevation  - 1 x daily - 3-4 x weekly - 3 sets - 10 reps - Squatting High Shoulder Row with Resistance  - 1 x daily - 3-4 x weekly - 3 sets - 10 reps - Standing Tricep Extensions with Resistance  - 1 x daily - 3-4 x weekly - 3 sets - 10 reps - Standing Upright Shoulder Row with Resistance  - 1 x daily - 3-4 x weekly - 3 sets - 10 reps - Standing Single Arm Bicep Curls Supinated with Dumbbell  - 1 x daily - 3-4 x weekly - 3 sets - 10 reps  ASSESSMENT:  CLINICAL IMPRESSION:    PT tx. session focused on L shoulder ROM and pain mgmt.  Pt. Limited by recent inflammation/ pain in L shoulder with overhead tasks/ lifting. Pt. Remains limited B  pec stretches and instructed on seated/ standing ex.  PT is concerned pt. Will have difficulty/ L shoulder pain with return to work/ lifting 50# overhead.  Pt. Unable to complete work-related tasks at this time.   Pt. Will continue with more independent HEP and gym based/  aquatic ex.    Pt will benefit from continued PT services with a decrease tx. Frequency to increase L shoulder ROM/ stability to improve pain-free mobility.    OBJECTIVE IMPAIRMENTS: decreased ROM, decreased strength, hypomobility, impaired UE functional use, and pain.   ACTIVITY LIMITATIONS: carrying, bathing, dressing, reach over head, and hygiene/grooming  PARTICIPATION LIMITATIONS: meal prep, cleaning, laundry, driving, community activity, occupation, and yard work  PERSONAL FACTORS: Age and 1 comorbidity: adhesive capsulitis  are also affecting patient's functional outcome.   REHAB POTENTIAL: Good  CLINICAL DECISION MAKING: Stable/uncomplicated  EVALUATION COMPLEXITY: Low  GOALS:  LONG TERM GOALS: Target date: 03/28/2024  Pt able to safely lift 50# box from waist to eye level with no sh. Pain or limitations to promote return to work.  Baseline: 30# box lift/ carry with good technique.   Goal status: Initial  2.  Pt will improve L shoulder MMT scores to at least 4/5 to demonstrate improvement in L shoulder strength. Baseline: See above Goal status: Partially met  3.  Pt will report <2/10 pain on NPS scale in order to show clinically significant improvement in pain at rest and with mobility. Baseline: increase shoulder discomfort with overhead lifting Goal status: Partially met  PLAN:  PT FREQUENCY: 2x/week  PT DURATION: 4 weeks  PLANNED INTERVENTIONS: 97164- PT Re-evaluation, 97110-Therapeutic exercises, 97530- Therapeutic activity, 97112- Neuromuscular re-education, 97140- Manual therapy, Joint mobilization, Cryotherapy, and Moist heat  PLAN FOR NEXT SESSION:  Progress UE strengthening/ functional reaching.  Work-related tasks  Cammie Mcgee, PT, DPT # [id number] Physical Therapist - [place]  [facility] 9:45 AM,03/15/24

## 2024-03-19 ENCOUNTER — Encounter: Admitting: Physical Therapy

## 2024-03-19 ENCOUNTER — Ambulatory Visit: Admitting: Physical Therapy

## 2024-03-19 ENCOUNTER — Encounter: Payer: Self-pay | Admitting: Physical Therapy

## 2024-03-19 DIAGNOSIS — Z9889 Other specified postprocedural states: Secondary | ICD-10-CM

## 2024-03-19 DIAGNOSIS — G8929 Other chronic pain: Secondary | ICD-10-CM

## 2024-03-19 DIAGNOSIS — M25612 Stiffness of left shoulder, not elsewhere classified: Secondary | ICD-10-CM

## 2024-03-19 NOTE — Therapy (Signed)
 OUTPATIENT PHYSICAL THERAPY SHOULDER TREATMENT  Patient Name: Hannah Cross MRN: [id number] DOB:10-02-1956, 68 y.o., female Today's Date: 03/19/2024  END OF SESSION:  PT End of Session - 03/19/24 1433     Visit Number 28    Number of Visits 33    Date for PT Re-Evaluation 03/28/24    PT Start Time 1429    PT Stop Time 1509    PT Time Calculation (min) 40 min             Past Surgical History:  Procedure Laterality Date   ABDOMINAL HYSTERECTOMY     OOPHORECTOMY     Patient Active Problem List   Diagnosis Date Noted   Syncope 08/06/2019    PCP: Titus Mould, NP  REFERRING PROVIDER: Fletcher Anon, MD  REFERRING DIAG: Incomplete rotator cuff tear or rupture of left shoulder, not specified as traumatic; adhesive capsulitis of left shoulder  THERAPY DIAG:  S/P arthroscopy of left shoulder  Chronic left shoulder pain  Stiffness of left shoulder, not elsewhere classified  S/P left rotator cuff repair  Rationale for Evaluation and Treatment: Rehabilitation  ONSET DATE: 10/13/2023    SUBJECTIVE:                                                                                                                                                                                      SUBJECTIVE STATEMENT: Pt reports to PT s/p L shoulder arthroscopy with rotator cuff repair, extensive debridement, tenodesis of long tendon of biceps, decompression of subacromial space with partial acromioplasty and distal claviculectomy (10/13/2023). Pt reports dealing with chronic bilateral shoulder pain for most of this year and received injections in both shoulders (L side in July); pt reported improvement in pain with her R shoulder but no improvement with her left, which is what resulted in her having the surgery. She says her pain is usually worse at night and she is having trouble sleeping because of the pain. Pt reports all her sensation has returned since the numbing  block wore off earlier this week.  Hand dominance: Right  PERTINENT HISTORY: Adhesive capsulitis, stress fx of L fibula (September 2024)  PAIN:  Are you having pain? Yes: NPRS scale: 7/10 now, 9/10 worst, 2/10 best Pain location: L shoulder Pain description: Sharp, burning Aggravating factors: Movement, having shoulder at rest without support Relieving factors: Ibuprofen  PRECAUTIONS: Shoulder  RED FLAGS: None   WEIGHT BEARING RESTRICTIONS: Yes NWB LUE for first 6 weeks after surgery  FALLS:  Has patient fallen in last 6 months? No  LIVING ENVIRONMENT: Lives with: lives with their spouse Lives in: House/apartment Stairs: Yes: External: 3 steps; none  Has following equipment at home: None  OCCUPATION: Safety Specialist  PLOF: Independent  PATIENT GOALS:Get normal ROM back with no pain, get back to cooking for her family  NEXT MD VISIT: TBD  OBJECTIVE:  Note: Objective measures were completed at Evaluation unless otherwise noted.  DIAGNOSTIC FINDINGS:  N/A  PATIENT SURVEYS:  FOTO 41/63  COGNITION: Overall cognitive status: Within functional limits for tasks assessed     SENSATION: Not tested (pt reports sensation has returned since numbing block wore off)  POSTURE: No significant postural limitations noted  UPPER EXTREMITY ROM:   Passive/Active ROM Right Eval (Active)/In sitting Left eval  Shoulder flexion 166 90 (abiding by protocol)  Shoulder extension 75   Shoulder abduction 160   Shoulder adduction    Shoulder internal rotation [facility]   Shoulder external rotation WFL ~10 degrees (abiding by protocol)  Elbow flexion [facility] WFL  Elbow extension [facility] WFL  Wrist flexion    Wrist extension    Wrist ulnar deviation    Wrist radial deviation    Wrist pronation    Wrist supination    (Blank rows = not tested)  UPPER EXTREMITY MMT:  MMT Right eval Left eval  Shoulder flexion 5   Shoulder extension 5   Shoulder abduction 5   Shoulder adduction     Shoulder internal rotation 5   Shoulder external rotation 4   Middle trapezius    Lower trapezius    Elbow flexion    Elbow extension    Wrist flexion    Wrist extension    Wrist ulnar deviation    Wrist radial deviation    Wrist pronation    Wrist supination    Grip strength (lbs) 36.9 lbs average   (Blank rows = not tested)  12/17: Grip strength: R 51#, L 32#.     UPPER EXTREMITY ROM: Reassessment of B shoulder AROM   Active ROM Right  (Active)/In sitting Left   Shoulder flexion 168 deg. 145 deg.  Shoulder extension 65 deg. 45 deg.  Shoulder abduction 158 deg. 131 deg.   Shoulder adduction      Shoulder internal rotation 82 deg. 58 deg.   Shoulder external rotation 75 deg. 64 deg.  Elbow flexion [facility] WFL  Elbow extension [facility] WFL  Wrist flexion      Wrist extension      Wrist ulnar deviation      Wrist radial deviation      Wrist pronation      Wrist supination      (Blank rows = not tested)   UPPER EXTREMITY MMT: Reassessment of B shoulder MMT   MMT Right eval Left eval  Shoulder flexion 4-  4  Shoulder extension 4-  3  Shoulder abduction 4  3+  Shoulder adduction      Shoulder internal rotation 5  4  Shoulder external rotation 4-  3+  Middle trapezius  3  4  Lower trapezius      Elbow flexion      Elbow extension      Wrist flexion      Wrist extension      Wrist ulnar deviation      Wrist radial deviation      Wrist pronation      Wrist supination      Grip strength (lbs) 59.8#  44.6#  (Blank rows = not tested)  TODAY'S TREATMENT:  DATE: 03/19/2024    Subjective:  Pt. Reports 1/10 L shoulder pain at this time.  Pt. Returns to MD on 4/15.  Pt. Needs to be able to lift/ carry a 50# box of parts and be able to push a heavy cart in order to return to work.      There.ex.:   Reassessment of L sh. AROM (all planes).   Good L sh. Flexion/ abduction with discomfort during overhead reaching.    Nautilus (wand): seated lat. Pull downs 40#/ standing tricep extension 30#/ standing scapular retraction 40# 15x2.    Supine L shoulder A/AROM all planes in a pain tolerable range with holds 10x each.  Supine L shoulder manual isometrics (all planes)- pain tolerable resistance/ holds.  10x with 5 sec. Holds for IR/ER/abduction.  Cuing to avoid pain with resistance.   Manual tx.:  STM to L shoulder/ UT/ biceps/ triceps.  L anterior to mid-deltoid/ proximal biceps soreness.  No trigger points noted.    Supine L shoulder AP/PA/inferior grade II-III mobs 3x20 sec. (As tolerated/ varying hand positions to decrease tenderness).    Pt. Instructed to ice L shoulder at home.      NOT TODAY Standing TRX shoulder flexion walk outs 10x (as tolerated).   Overhead lifting at shelf 7-17# with handles.   Standing bodyblade at mirror: sh. Flexion/ biceps/ triceps/ IR/ ER 2x30 sec.   Nautilus:  seated lat pull downs with wand 50# (added sh. Flexion static holds)/ tricep extension 30#/ scap. Retraction with wand 50# 20x2.  Standing chest press with wand 40# 15x2.     There.act.:  70# sled push/pull in hallway (35 feet x 3).  Box lifting (floor to waist): 25# with mirror feedback/ feedback for body mechanics. Marked fatigue noted.      PATIENT EDUCATION: Education details: Role of PT, PT plan of care, icing/ HEP with GTB.   Person educated: Patient Education method: Medical illustrator Education comprehension: verbalized understanding  HOME EXERCISE PROGRAM: Access Code: [email or web address] URL: [email or web address] Date: 10/26/2023 Prepared by: Dorene Grebe Exercises - Seated Scapular Retraction - 2 x daily - 7 x weekly - 1 sets - 10 reps - Seated Cervical Rotation AROM - 2 x daily - 7 x weekly - 1 sets - 10 reps - Seated Cervical Sidebending AROM - 2 x daily - 7 x weekly - 1 sets - 10 reps - Standing  Circular Shoulder Pendulum Supported with Arm Bent - 2 x daily - 7 x weekly - 1 sets - 10 reps - Seated Elbow Flexion AAROM - 2 x daily - 7 x weekly - 1 sets - 10 reps - Seated AAROM Elbow Supination/Pronation with Clasped Hands - 2 x daily - 7 x weekly - 1 sets - 10 reps  Access Code: [email or web address] URL: [email or web address] Date: 10/28/2023 Prepared by: Dorene Grebe  Exercises - Seated Scapular Retraction  - 2 x daily - 7 x weekly - 1 sets - 10 reps - Seated Cervical Rotation AROM  - 2 x daily - 7 x weekly - 1 sets - 10 reps - Seated Cervical Sidebending AROM  - 2 x daily - 7 x weekly - 1 sets - 10 reps - Standing Circular Shoulder Pendulum Supported with Arm Bent  - 2 x daily - 7 x weekly - 1 sets - 10 reps - Seated Elbow Flexion AAROM  - 2 x daily - 7 x weekly - 1 sets - 10 reps - Seated Shoulder Flexion Self PROM  - 3 x  daily - 7 x weekly - 1 sets - 10 reps - 5-10 second hold - Supine Shoulder External Rotation with Dowel  - 3 x daily - 7 x weekly - 1 sets - 10 reps - 5-10 seconds hold  Access Code: [email or web address] URL: [email or web address] Date: 11/16/2023 Prepared by: Dorene Grebe  Exercises - Seated Cervical Sidebending AROM  - 2 x daily - 7 x weekly - 1 sets - 10 reps - Circular Shoulder Pendulum with Table Support  - 2 x daily - 7 x weekly - 1 sets - 10 reps - Seated Shoulder Flexion AAROM with Pulley Behind  - 2 x daily - 7 x weekly - 1 sets - 20 reps - Seated Shoulder Scaption AAROM with Pulley at Side  - 2 x daily - 7 x weekly - 1 sets - 20 reps - Seated Shoulder Abduction AAROM with Pulley Behind  - 2 x daily - 7 x weekly - 1 sets - 20 reps - Supine Shoulder External Rotation in 45 Degrees Abduction AAROM with Dowel  - 2 x daily - 7 x weekly - 1 sets - 10 reps  Access Code: [email or web address] URL: [email or web address] Date: 12/12/2023 Prepared by: Dorene Grebe  Exercises - Seated Shoulder Flexion AAROM with Pulley Behind  - 2 x daily - 7 x  weekly - 1 sets - 20 reps - Seated Shoulder Scaption AAROM with Pulley at Side  - 2 x daily - 7 x weekly - 1 sets - 20 reps - Seated Shoulder Abduction AAROM with Pulley Behind  - 2 x daily - 7 x weekly - 1 sets - 20 reps - Standing Shoulder Flexion Wall Walk  - 2 x daily - 7 x weekly - 2 sets - 10 reps - Sidelying Shoulder External Rotation  - 2 x daily - 7 x weekly - 2 sets - 10 reps - Supine Pectoralis Stretch  - 1 x daily - 7 x weekly - 1 sets - 10 reps - Standing Isometric Shoulder External Rotation with Doorway  - 1 x daily - 7 x weekly - 2 sets - 10 reps - 5 seconds hold - Standing Isometric Shoulder Flexion with Doorway - Arm Bent  - 1 x daily - 7 x weekly - 2 sets - 10 reps - 5 seconds hold.  Access Code: [email or web address] URL: [email or web address] Date: 01/19/2024 Prepared by: Dorene Grebe  Exercises - Shoulder W - External Rotation with Resistance  - 1 x daily - 3-4 x weekly - 3 sets - 10 reps - Shoulder extension with resistance - Neutral  - 1 x daily - 3-4 x weekly - 3 sets - 10 reps - Doorway Pec Stretch at 60 Elevation  - 1 x daily - 3-4 x weekly - 3 sets - 10 reps - Squatting High Shoulder Row with Resistance  - 1 x daily - 3-4 x weekly - 3 sets - 10 reps - Standing Tricep Extensions with Resistance  - 1 x daily - 3-4 x weekly - 3 sets - 10 reps - Standing Upright Shoulder Row with Resistance  - 1 x daily - 3-4 x weekly - 3 sets - 10 reps - Standing Single Arm Bicep Curls Supinated with Dumbbell  - 1 x daily - 3-4 x weekly - 3 sets - 10 reps  ASSESSMENT:  CLINICAL IMPRESSION:    PT tx. session focused on L shoulder ROM, stability and pain mgmt.  Pt. Remains limited B pec stretches and instructed on seated/ standing ex.  PT  is concerned pt. Will have difficulty/ L shoulder pain with return to work/ lifting 50# overhead.  Pt. Unable to complete work-related tasks at this time.   Pt. Will continue with more independent HEP and gym based/ aquatic ex.    Pt will benefit  from continued PT services with a decrease tx. Frequency to increase L shoulder ROM/ stability to improve pain-free mobility.    OBJECTIVE IMPAIRMENTS: decreased ROM, decreased strength, hypomobility, impaired UE functional use, and pain.   ACTIVITY LIMITATIONS: carrying, bathing, dressing, reach over head, and hygiene/grooming  PARTICIPATION LIMITATIONS: meal prep, cleaning, laundry, driving, community activity, occupation, and yard work  PERSONAL FACTORS: Age and 1 comorbidity: adhesive capsulitis  are also affecting patient's functional outcome.   REHAB POTENTIAL: Good  CLINICAL DECISION MAKING: Stable/uncomplicated  EVALUATION COMPLEXITY: Low  GOALS:  LONG TERM GOALS: Target date: 03/28/2024  Pt able to safely lift 50# box from waist to eye level with no sh. Pain or limitations to promote return to work.  Baseline: 30# box lift/ carry with good technique.   Goal status: Initial  2.  Pt will improve L shoulder MMT scores to at least 4/5 to demonstrate improvement in L shoulder strength. Baseline: See above Goal status: Partially met  3.  Pt will report <2/10 pain on NPS scale in order to show clinically significant improvement in pain at rest and with mobility. Baseline: increase shoulder discomfort with overhead lifting Goal status: Partially met  PLAN:  PT FREQUENCY: 2x/week  PT DURATION: 4 weeks  PLANNED INTERVENTIONS: 97164- PT Re-evaluation, 97110-Therapeutic exercises, 97530- Therapeutic activity, 97112- Neuromuscular re-education, 97140- Manual therapy, Joint mobilization, Cryotherapy, and Moist heat  PLAN FOR NEXT SESSION:  Progress UE strengthening/ functional reaching.  Work-related tasks  Cammie Mcgee, PT, DPT # [phone] Physical Therapist - [place]  [facility] 3:10 PM,03/19/24

## 2024-03-21 ENCOUNTER — Encounter: Admitting: Physical Therapy

## 2024-03-21 ENCOUNTER — Ambulatory Visit: Admitting: Physical Therapy

## 2024-03-21 DIAGNOSIS — M25612 Stiffness of left shoulder, not elsewhere classified: Secondary | ICD-10-CM

## 2024-03-21 DIAGNOSIS — Z9889 Other specified postprocedural states: Secondary | ICD-10-CM

## 2024-03-21 DIAGNOSIS — M25512 Pain in left shoulder: Secondary | ICD-10-CM

## 2024-03-21 NOTE — Therapy (Signed)
 OUTPATIENT PHYSICAL THERAPY SHOULDER TREATMENT  Patient Name: Hannah Cross MRN: [id number] DOB:Apr 15, 1956, 68 y.o., female Today's Date: 03/21/2024  END OF SESSION:  PT End of Session - 03/21/24 1433     Visit Number 29    Number of Visits 33    Date for PT Re-Evaluation 03/28/24    PT Start Time 1431            1431 to 1518  (47 minutes).    Past Surgical History:  Procedure Laterality Date   ABDOMINAL HYSTERECTOMY     OOPHORECTOMY     Patient Active Problem List   Diagnosis Date Noted   Syncope 08/06/2019    PCP: Claretta Croft, NP  REFERRING PROVIDER: Klifto, Louetta Roux, MD  REFERRING DIAG: Incomplete rotator cuff tear or rupture of left shoulder, not specified as traumatic; adhesive capsulitis of left shoulder  THERAPY DIAG:  S/P arthroscopy of left shoulder  Chronic left shoulder pain  Stiffness of left shoulder, not elsewhere classified  S/P left rotator cuff repair  Rationale for Evaluation and Treatment: Rehabilitation  ONSET DATE: 10/13/2023    SUBJECTIVE:                                                                                                                                                                                      SUBJECTIVE STATEMENT: Pt reports to PT s/p L shoulder arthroscopy with rotator cuff repair, extensive debridement, tenodesis of long tendon of biceps, decompression of subacromial space with partial acromioplasty and distal claviculectomy (10/13/2023). Pt reports dealing with chronic bilateral shoulder pain for most of this year and received injections in both shoulders (L side in July); pt reported improvement in pain with her R shoulder but no improvement with her left, which is what resulted in her having the surgery. She says her pain is usually worse at night and she is having trouble sleeping because of the pain. Pt reports all her sensation has returned since the numbing block wore off earlier this  week.  Hand dominance: Right  PERTINENT HISTORY: Adhesive capsulitis, stress fx of L fibula (September 2024)  PAIN:  Are you having pain? Yes: NPRS scale: 7/10 now, 9/10 worst, 2/10 best Pain location: L shoulder Pain description: Sharp, burning Aggravating factors: Movement, having shoulder at rest without support Relieving factors: Ibuprofen  PRECAUTIONS: Shoulder  RED FLAGS: None   WEIGHT BEARING RESTRICTIONS: Yes NWB LUE for first 6 weeks after surgery  FALLS:  Has patient fallen in last 6 months? No  LIVING ENVIRONMENT: Lives with: lives with their spouse Lives in: House/apartment Stairs: Yes: External: 3 steps; none Has following equipment at home: None  OCCUPATION:  Safety Specialist  PLOF: Independent  PATIENT GOALS:Get normal ROM back with no pain, get back to cooking for her family  NEXT MD VISIT: TBD  OBJECTIVE:  Note: Objective measures were completed at Evaluation unless otherwise noted.  DIAGNOSTIC FINDINGS:  N/A  PATIENT SURVEYS:  FOTO 41/63  COGNITION: Overall cognitive status: Within functional limits for tasks assessed     SENSATION: Not tested (pt reports sensation has returned since numbing block wore off)  POSTURE: No significant postural limitations noted  UPPER EXTREMITY ROM:   Passive/Active ROM Right Eval (Active)/In sitting Left eval  Shoulder flexion 166 90 (abiding by protocol)  Shoulder extension 75   Shoulder abduction 160   Shoulder adduction    Shoulder internal rotation [facility]   Shoulder external rotation WFL ~10 degrees (abiding by protocol)  Elbow flexion [facility] WFL  Elbow extension [facility] WFL  Wrist flexion    Wrist extension    Wrist ulnar deviation    Wrist radial deviation    Wrist pronation    Wrist supination    (Blank rows = not tested)  UPPER EXTREMITY MMT:  MMT Right eval Left eval  Shoulder flexion 5   Shoulder extension 5   Shoulder abduction 5   Shoulder adduction    Shoulder internal rotation 5    Shoulder external rotation 4   Middle trapezius    Lower trapezius    Elbow flexion    Elbow extension    Wrist flexion    Wrist extension    Wrist ulnar deviation    Wrist radial deviation    Wrist pronation    Wrist supination    Grip strength (lbs) 36.9 lbs average   (Blank rows = not tested)  12/17: Grip strength: R 51#, L 32#.     UPPER EXTREMITY ROM: Reassessment of B shoulder AROM   Active ROM Right  (Active)/In sitting Left   Shoulder flexion 168 deg. 145 deg.  Shoulder extension 65 deg. 45 deg.  Shoulder abduction 158 deg. 131 deg.   Shoulder adduction      Shoulder internal rotation 82 deg. 58 deg.   Shoulder external rotation 75 deg. 64 deg.  Elbow flexion [facility] WFL  Elbow extension [facility] WFL  Wrist flexion      Wrist extension      Wrist ulnar deviation      Wrist radial deviation      Wrist pronation      Wrist supination      (Blank rows = not tested)   UPPER EXTREMITY MMT: Reassessment of B shoulder MMT   MMT Right eval Left eval  Shoulder flexion 4-  4  Shoulder extension 4-  3  Shoulder abduction 4  3+  Shoulder adduction      Shoulder internal rotation 5  4  Shoulder external rotation 4-  3+  Middle trapezius  3  4  Lower trapezius      Elbow flexion      Elbow extension      Wrist flexion      Wrist extension      Wrist ulnar deviation      Wrist radial deviation      Wrist pronation      Wrist supination      Grip strength (lbs) 59.8#  44.6#  (Blank rows = not tested)  TODAY'S TREATMENT:  DATE: 03/21/2024    Subjective:  Pt. Reports 1/10 L shoulder pain/soreness prior to tx. Session.  Pt. Has been working with rock/ concrete today in yard.  Pt. Returns to MD on 4/15.  Pt. Needs to be able to lift/ carry a 50# box of parts and be able to push a heavy cart in order to return to work.      There.ex.:    B UBE 2 min. F/b.  Discussed yard work.    Standing wall ladder: sh. Flexion 2x (L/R symmetrical height).    Seated L shoulder flexion (141 deg.), abduction (136).  Good scapular motion with shoulder abduction.  Supine L shoulder IR (60 deg.), ER (46 deg.)- tight/ guarded today.  Increase L shoulder ER AROM to 62 deg.      Supine L shoulder A/AROM all planes (wt. wand) in a pain tolerable range with holds 10x each.  Press-ups/ shoulder flexion (pain tolerable range)/ tricep extension.    Supine L shoulder manual isometrics (all planes)- pain tolerable resistance/ holds.  10x with 5 sec. Holds for IR/ER/abduction.  Cuing to avoid pain with resistance.   Demonstrated doorway pec stretches (preferred unilateral).    Manual tx.:  STM to L shoulder/ UT/ biceps/ triceps.  L anterior to mid-deltoid/ proximal biceps soreness.  No trigger points noted.    Supine L shoulder AP/PA/inferior grade II-III mobs 3x20 sec. (As tolerated/ varying hand positions to decrease tenderness).    Pt. Instructed to ice L shoulder at home.    PATIENT EDUCATION: Education details: Role of PT, PT plan of care, icing/ HEP with GTB.   Person educated: Patient Education method: Medical illustrator Education comprehension: verbalized understanding  HOME EXERCISE PROGRAM: Access Code: [email or web address] URL: [email or web address] Date: 10/26/2023 Prepared by: Hazeline Lister Exercises - Seated Scapular Retraction - 2 x daily - 7 x weekly - 1 sets - 10 reps - Seated Cervical Rotation AROM - 2 x daily - 7 x weekly - 1 sets - 10 reps - Seated Cervical Sidebending AROM - 2 x daily - 7 x weekly - 1 sets - 10 reps - Standing Circular Shoulder Pendulum Supported with Arm Bent - 2 x daily - 7 x weekly - 1 sets - 10 reps - Seated Elbow Flexion AAROM - 2 x daily - 7 x weekly - 1 sets - 10 reps - Seated AAROM Elbow Supination/Pronation with Clasped Hands - 2 x daily - 7 x weekly - 1 sets - 10 reps  Access Code:  [email or web address] URL: [email or web address] Date: 10/28/2023 Prepared by: Hazeline Lister  Exercises - Seated Scapular Retraction  - 2 x daily - 7 x weekly - 1 sets - 10 reps - Seated Cervical Rotation AROM  - 2 x daily - 7 x weekly - 1 sets - 10 reps - Seated Cervical Sidebending AROM  - 2 x daily - 7 x weekly - 1 sets - 10 reps - Standing Circular Shoulder Pendulum Supported with Arm Bent  - 2 x daily - 7 x weekly - 1 sets - 10 reps - Seated Elbow Flexion AAROM  - 2 x daily - 7 x weekly - 1 sets - 10 reps - Seated Shoulder Flexion Self PROM  - 3 x daily - 7 x weekly - 1 sets - 10 reps - 5-10 second hold - Supine Shoulder External Rotation with Dowel  - 3 x daily - 7 x weekly - 1 sets - 10 reps - 5-10 seconds hold  Access Code: [email or web address] URL:  [email or web address] Date: 11/16/2023 Prepared by: Hazeline Lister  Exercises - Seated Cervical Sidebending AROM  - 2 x daily - 7 x weekly - 1 sets - 10 reps - Circular Shoulder Pendulum with Table Support  - 2 x daily - 7 x weekly - 1 sets - 10 reps - Seated Shoulder Flexion AAROM with Pulley Behind  - 2 x daily - 7 x weekly - 1 sets - 20 reps - Seated Shoulder Scaption AAROM with Pulley at Side  - 2 x daily - 7 x weekly - 1 sets - 20 reps - Seated Shoulder Abduction AAROM with Pulley Behind  - 2 x daily - 7 x weekly - 1 sets - 20 reps - Supine Shoulder External Rotation in 45 Degrees Abduction AAROM with Dowel  - 2 x daily - 7 x weekly - 1 sets - 10 reps  Access Code: [email or web address] URL: [email or web address] Date: 12/12/2023 Prepared by: Hazeline Lister  Exercises - Seated Shoulder Flexion AAROM with Pulley Behind  - 2 x daily - 7 x weekly - 1 sets - 20 reps - Seated Shoulder Scaption AAROM with Pulley at Side  - 2 x daily - 7 x weekly - 1 sets - 20 reps - Seated Shoulder Abduction AAROM with Pulley Behind  - 2 x daily - 7 x weekly - 1 sets - 20 reps - Standing Shoulder Flexion Wall Walk  - 2 x daily - 7 x weekly - 2  sets - 10 reps - Sidelying Shoulder External Rotation  - 2 x daily - 7 x weekly - 2 sets - 10 reps - Supine Pectoralis Stretch  - 1 x daily - 7 x weekly - 1 sets - 10 reps - Standing Isometric Shoulder External Rotation with Doorway  - 1 x daily - 7 x weekly - 2 sets - 10 reps - 5 seconds hold - Standing Isometric Shoulder Flexion with Doorway - Arm Bent  - 1 x daily - 7 x weekly - 2 sets - 10 reps - 5 seconds hold.  Access Code: [email or web address] URL: [email or web address] Date: 01/19/2024 Prepared by: Hazeline Lister  Exercises - Shoulder W - External Rotation with Resistance  - 1 x daily - 3-4 x weekly - 3 sets - 10 reps - Shoulder extension with resistance - Neutral  - 1 x daily - 3-4 x weekly - 3 sets - 10 reps - Doorway Pec Stretch at 60 Elevation  - 1 x daily - 3-4 x weekly - 3 sets - 10 reps - Squatting High Shoulder Row with Resistance  - 1 x daily - 3-4 x weekly - 3 sets - 10 reps - Standing Tricep Extensions with Resistance  - 1 x daily - 3-4 x weekly - 3 sets - 10 reps - Standing Upright Shoulder Row with Resistance  - 1 x daily - 3-4 x weekly - 3 sets - 10 reps - Standing Single Arm Bicep Curls Supinated with Dumbbell  - 1 x daily - 3-4 x weekly - 3 sets - 10 reps  ASSESSMENT:  CLINICAL IMPRESSION:    PT tx. session focused on L shoulder ROM, stability and pain mgmt.  Pt. Remains limited B pec stretches and instructed on seated/ standing ex.  PT is concerned pt. Will have difficulty/ L shoulder pain with return to work/ lifting 50# overhead.  Pt. Unable to complete work-related tasks at this time.   Pt. Will continue with more independent HEP and gym based/ aquatic ex.    Pt will benefit  from continued PT services with a decrease tx. Frequency to increase L shoulder ROM/ stability to improve pain-free mobility.    OBJECTIVE IMPAIRMENTS: decreased ROM, decreased strength, hypomobility, impaired UE functional use, and pain.   ACTIVITY LIMITATIONS: carrying, bathing, dressing,  reach over head, and hygiene/grooming  PARTICIPATION LIMITATIONS: meal prep, cleaning, laundry, driving, community activity, occupation, and yard work  PERSONAL FACTORS: Age and 1 comorbidity: adhesive capsulitis  are also affecting patient's functional outcome.   REHAB POTENTIAL: Good  CLINICAL DECISION MAKING: Stable/uncomplicated  EVALUATION COMPLEXITY: Low  GOALS:  LONG TERM GOALS: Target date: 03/28/2024  Pt able to safely lift 50# box from waist to eye level with no sh. Pain or limitations to promote return to work.  Baseline: 30# box lift/ carry with good technique.   Goal status: Initial  2.  Pt will improve L shoulder MMT scores to at least 4/5 to demonstrate improvement in L shoulder strength. Baseline: See above Goal status: Partially met  3.  Pt will report <2/10 pain on NPS scale in order to show clinically significant improvement in pain at rest and with mobility. Baseline: increase shoulder discomfort with overhead lifting Goal status: Partially met  PLAN:  PT FREQUENCY: 2x/week  PT DURATION: 4 weeks  PLANNED INTERVENTIONS: 97164- PT Re-evaluation, 97110-Therapeutic exercises, 97530- Therapeutic activity, 97112- Neuromuscular re-education, 97140- Manual therapy, Joint mobilization, Cryotherapy, and Moist heat  PLAN FOR NEXT SESSION:  Progress UE strengthening/ functional reaching.  Work-related tasks  Lendell Quarry, PT, DPT # [phone] Physical Therapist - [facility] 2:33 PM,03/21/24

## 2024-03-28 ENCOUNTER — Encounter: Payer: Self-pay | Admitting: Physical Therapy

## 2024-03-28 ENCOUNTER — Encounter: Admitting: Physical Therapy

## 2024-03-28 ENCOUNTER — Ambulatory Visit: Admitting: Physical Therapy

## 2024-04-05 ENCOUNTER — Ambulatory Visit: Admitting: Physical Therapy

## 2024-04-05 ENCOUNTER — Encounter: Payer: Self-pay | Admitting: Physical Therapy

## 2024-04-05 DIAGNOSIS — M25612 Stiffness of left shoulder, not elsewhere classified: Secondary | ICD-10-CM

## 2024-04-05 DIAGNOSIS — Z9889 Other specified postprocedural states: Secondary | ICD-10-CM

## 2024-04-05 DIAGNOSIS — G8929 Other chronic pain: Secondary | ICD-10-CM

## 2024-04-05 NOTE — Therapy (Signed)
 OUTPATIENT PHYSICAL THERAPY SHOULDER TREATMENT/ RECERTIFICATION Physical Therapy Progress Note  Dates of reporting period  01/19/24   to   04/05/24   Patient Name: Hannah Cross MRN: [id number] DOB:[birth year], 68 y.o., female Today's Date: 04/05/2024  END OF SESSION:  PT End of Session - 04/05/24 1346     Visit Number 30    Number of Visits 42    Date for PT Re-Evaluation 06/28/24    PT Start Time 1346    PT Stop Time 1432    PT Time Calculation (min) 46 min            Past Surgical History:  Procedure Laterality Date   ABDOMINAL HYSTERECTOMY     OOPHORECTOMY     Patient Active Problem List   Diagnosis Date Noted   Syncope 08/06/2019    PCP: Claretta Croft, NP  REFERRING PROVIDER: Pam Bode, MD  REFERRING DIAG: Incomplete rotator cuff tear or rupture of left shoulder, not specified as traumatic; adhesive capsulitis of left shoulder  THERAPY DIAG:  S/P arthroscopy of left shoulder  Chronic left shoulder pain  Stiffness of left shoulder, not elsewhere classified  S/P left rotator cuff repair  Rationale for Evaluation and Treatment: Rehabilitation  ONSET DATE: 10/13/2023    SUBJECTIVE:                                                                                                                                                                                      SUBJECTIVE STATEMENT: Pt reports to PT s/p L shoulder arthroscopy with rotator cuff repair, extensive debridement, tenodesis of long tendon of biceps, decompression of subacromial space with partial acromioplasty and distal claviculectomy (10/13/2023). Pt reports dealing with chronic bilateral shoulder pain for most of this year and received injections in both shoulders (L side in July); pt reported improvement in pain with her R shoulder but no improvement with her left, which is what resulted in her having the surgery. She says her pain is usually worse at night and she is  having trouble sleeping because of the pain. Pt reports all her sensation has returned since the numbing block wore off earlier this week.  Hand dominance: Right  PERTINENT HISTORY: Adhesive capsulitis, stress fx of L fibula (September 2024)  PAIN:  Are you having pain? Yes: NPRS scale: 7/10 now, 9/10 worst, 2/10 best Pain location: L shoulder Pain description: Sharp, burning Aggravating factors: Movement, having shoulder at rest without support Relieving factors: Ibuprofen  PRECAUTIONS: Shoulder  RED FLAGS: None   WEIGHT BEARING RESTRICTIONS: Yes NWB LUE for first 6 weeks after surgery  FALLS:  Has patient fallen in last 6 months? No  LIVING ENVIRONMENT: Lives with: lives with their spouse Lives in: House/apartment Stairs: Yes: External: 3 steps; none Has following equipment at home: None  OCCUPATION: Safety Specialist  PLOF: Independent  PATIENT GOALS:Get normal ROM back with no pain, get back to cooking for her family  NEXT MD VISIT: TBD  OBJECTIVE:  Note: Objective measures were completed at Evaluation unless otherwise noted.  DIAGNOSTIC FINDINGS:  N/A  PATIENT SURVEYS:  FOTO 41/63  COGNITION: Overall cognitive status: Within functional limits for tasks assessed     SENSATION: Not tested (pt reports sensation has returned since numbing block wore off)  POSTURE: No significant postural limitations noted  UPPER EXTREMITY ROM:   Passive/Active ROM Right Eval (Active)/In sitting Left eval  Shoulder flexion 166 90 (abiding by protocol)  Shoulder extension 75   Shoulder abduction 160   Shoulder adduction    Shoulder internal rotation [facility]   Shoulder external rotation WFL ~10 degrees (abiding by protocol)  Elbow flexion [facility] WFL  Elbow extension [facility] WFL  Wrist flexion    Wrist extension    Wrist ulnar deviation    Wrist radial deviation    Wrist pronation    Wrist supination    (Blank rows = not tested)  UPPER EXTREMITY MMT:  MMT Right eval  Left eval  Shoulder flexion 5   Shoulder extension 5   Shoulder abduction 5   Shoulder adduction    Shoulder internal rotation 5   Shoulder external rotation 4   Middle trapezius    Lower trapezius    Elbow flexion    Elbow extension    Wrist flexion    Wrist extension    Wrist ulnar deviation    Wrist radial deviation    Wrist pronation    Wrist supination    Grip strength (lbs) 36.9 lbs average   (Blank rows = not tested)  12/17: Grip strength: R 51#, L 32#.     UPPER EXTREMITY ROM: Reassessment of B shoulder AROM   Active ROM Right  (Active)/In sitting Left   Shoulder flexion 168 deg. 145 deg.  Shoulder extension 65 deg. 45 deg.  Shoulder abduction 158 deg. 131 deg.   Shoulder adduction      Shoulder internal rotation 82 deg. 58 deg.   Shoulder external rotation 75 deg. 64 deg.  Elbow flexion [facility] WFL  Elbow extension [facility] WFL  Wrist flexion      Wrist extension      Wrist ulnar deviation      Wrist radial deviation      Wrist pronation      Wrist supination      (Blank rows = not tested)   UPPER EXTREMITY MMT: Reassessment of B shoulder MMT   MMT Right eval Left eval  Shoulder flexion 4-  4  Shoulder extension 4-  3  Shoulder abduction 4  3+  Shoulder adduction      Shoulder internal rotation 5  4  Shoulder external rotation 4-  3+  Middle trapezius  3  4  Lower trapezius      Elbow flexion      Elbow extension      Wrist flexion      Wrist extension      Wrist ulnar deviation      Wrist radial deviation      Wrist pronation      Wrist supination      Grip strength (lbs) 59.8#  44.6#  (Blank rows = not tested)  Seated L  shoulder flexion (141 deg.), abduction (136).  Good scapular motion with shoulder abduction.  Supine L shoulder IR (60 deg.), ER (46 deg.)- tight/ guarded today.  Increase L shoulder ER AROM to 62 deg.    TODAY'S TREATMENT:                                                                                                                                          DATE: 04/05/2024    Subjective:  Pt. Had f/u with MD last week (4/15) and reports 2/10 L shoulder pain/soreness prior to tx. Session.  Pt. Reports L anterior/ biceps "burning" pain and limited sleeping at night.  Pt. Needs to be able to lift/ carry a 50# box of parts and be able to push a heavy cart in order to return to work.   Pt. Will be out of work for next 3 months per MD order and will focus on strengthening/ pain mgmt.  Pt. Continues to be compliant with gym based ex.  Pt. Has trip to [place], [place] this weekend.     There.ex.:   B UBE 3 min. F/b.  Discussed gym based ex. (Pt. Doing low resistance).     Wall push 10x.   Discussed hand position/ posture.    Supine L shoulder 3# ex.:  press-ups/ tricep extension/ bicep curls/ chest press 20x.     Supine L shoulder manual isometrics (all planes)- pain tolerable resistance/ holds.  10x with 5 sec. Holds for IR/ER/abduction.  Cuing to avoid pain with resistance.   There.act.:  Standing Blaze pods touches (2 min.)- overhead at [employer]  Several short rest breaks.    Standing overhead reaching tasks.    Manual tx.:  STM to L shoulder/ UT/ biceps/ triceps.  L anterior to mid-deltoid/ proximal biceps soreness.  No trigger points noted but generalized tenderness in multiple location.    Supine L shoulder AP/PA/inferior grade II-III mobs 3x20 sec. (As tolerated/ varying hand positions to decrease tenderness).    Pt. Instructed to ice L shoulder at home.    PATIENT EDUCATION: Education details: Role of PT, PT plan of care, icing/ HEP with GTB.   Person educated: Patient Education method: Medical illustrator Education comprehension: verbalized understanding  HOME EXERCISE PROGRAM: Access Code: [email or web address] URL: [email or web address] Date: 10/26/2023 Prepared by: Hazeline Lister Exercises - Seated Scapular Retraction - 2 x daily - 7 x weekly - 1 sets - 10 reps - Seated Cervical Rotation AROM -  2 x daily - 7 x weekly - 1 sets - 10 reps - Seated Cervical Sidebending AROM - 2 x daily - 7 x weekly - 1 sets - 10 reps - Standing Circular Shoulder Pendulum Supported with Arm Bent - 2 x daily - 7 x weekly - 1 sets - 10 reps - Seated Elbow Flexion AAROM - 2 x daily - 7 x weekly - 1 sets - 10 reps -  Seated AAROM Elbow Supination/Pronation with Clasped Hands - 2 x daily - 7 x weekly - 1 sets - 10 reps  Access Code: [email or web address] URL: [email or web address] Date: 10/28/2023 Prepared by: Hazeline Lister  Exercises - Seated Scapular Retraction  - 2 x daily - 7 x weekly - 1 sets - 10 reps - Seated Cervical Rotation AROM  - 2 x daily - 7 x weekly - 1 sets - 10 reps - Seated Cervical Sidebending AROM  - 2 x daily - 7 x weekly - 1 sets - 10 reps - Standing Circular Shoulder Pendulum Supported with Arm Bent  - 2 x daily - 7 x weekly - 1 sets - 10 reps - Seated Elbow Flexion AAROM  - 2 x daily - 7 x weekly - 1 sets - 10 reps - Seated Shoulder Flexion Self PROM  - 3 x daily - 7 x weekly - 1 sets - 10 reps - 5-10 second hold - Supine Shoulder External Rotation with Dowel  - 3 x daily - 7 x weekly - 1 sets - 10 reps - 5-10 seconds hold  Access Code: [email or web address] URL: [email or web address] Date: 11/16/2023 Prepared by: Hazeline Lister  Exercises - Seated Cervical Sidebending AROM  - 2 x daily - 7 x weekly - 1 sets - 10 reps - Circular Shoulder Pendulum with Table Support  - 2 x daily - 7 x weekly - 1 sets - 10 reps - Seated Shoulder Flexion AAROM with Pulley Behind  - 2 x daily - 7 x weekly - 1 sets - 20 reps - Seated Shoulder Scaption AAROM with Pulley at Side  - 2 x daily - 7 x weekly - 1 sets - 20 reps - Seated Shoulder Abduction AAROM with Pulley Behind  - 2 x daily - 7 x weekly - 1 sets - 20 reps - Supine Shoulder External Rotation in 45 Degrees Abduction AAROM with Dowel  - 2 x daily - 7 x weekly - 1 sets - 10 reps  Access Code: [email or web address] URL:  [email or web address] Date: 12/12/2023 Prepared by: Hazeline Lister  Exercises - Seated Shoulder Flexion AAROM with Pulley Behind  - 2 x daily - 7 x weekly - 1 sets - 20 reps - Seated Shoulder Scaption AAROM with Pulley at Side  - 2 x daily - 7 x weekly - 1 sets - 20 reps - Seated Shoulder Abduction AAROM with Pulley Behind  - 2 x daily - 7 x weekly - 1 sets - 20 reps - Standing Shoulder Flexion Wall Walk  - 2 x daily - 7 x weekly - 2 sets - 10 reps - Sidelying Shoulder External Rotation  - 2 x daily - 7 x weekly - 2 sets - 10 reps - Supine Pectoralis Stretch  - 1 x daily - 7 x weekly - 1 sets - 10 reps - Standing Isometric Shoulder External Rotation with Doorway  - 1 x daily - 7 x weekly - 2 sets - 10 reps - 5 seconds hold - Standing Isometric Shoulder Flexion with Doorway - Arm Bent  - 1 x daily - 7 x weekly - 2 sets - 10 reps - 5 seconds hold.  Access Code: [email or web address] URL: [email or web address] Date: 01/19/2024 Prepared by: Hazeline Lister  Exercises - Shoulder W - External Rotation with Resistance  - 1 x daily - 3-4 x weekly - 3 sets - 10 reps - Shoulder extension with resistance - Neutral  - 1 x daily - 3-4 x weekly - 3 sets -  10 reps - Doorway Pec Stretch at 60 Elevation  - 1 x daily - 3-4 x weekly - 3 sets - 10 reps - Squatting High Shoulder Row with Resistance  - 1 x daily - 3-4 x weekly - 3 sets - 10 reps - Standing Tricep Extensions with Resistance  - 1 x daily - 3-4 x weekly - 3 sets - 10 reps - Standing Upright Shoulder Row with Resistance  - 1 x daily - 3-4 x weekly - 3 sets - 10 reps - Standing Single Arm Bicep Curls Supinated with Dumbbell  - 1 x daily - 3-4 x weekly - 3 sets - 10 reps  ASSESSMENT:  CLINICAL IMPRESSION:    PT tx. session focused on L shoulder ROM, stability and pain mgmt.  Pt. Remains limited B pec stretches and instructed on seated/ standing ex.  PT is concerned pt. Will have difficulty/ L shoulder pain with return to work/ lifting  50# overhead.  Pt. Unable to complete work-related tasks at this time.   Pt. Will continue with more independent HEP and gym based/ aquatic ex.    Pt will benefit from continued PT services 1x/week with combined gym based there.ex. to increase L shoulder ROM/ stability to improve pain-free mobility.    OBJECTIVE IMPAIRMENTS: decreased ROM, decreased strength, hypomobility, impaired UE functional use, and pain.   ACTIVITY LIMITATIONS: carrying, bathing, dressing, reach over head, and hygiene/grooming  PARTICIPATION LIMITATIONS: meal prep, cleaning, laundry, driving, community activity, occupation, and yard work  PERSONAL FACTORS: Age and 1 comorbidity: adhesive capsulitis  are also affecting patient's functional outcome.   REHAB POTENTIAL: Good  CLINICAL DECISION MAKING: Stable/uncomplicated  EVALUATION COMPLEXITY: Low  GOALS:  LONG TERM GOALS: Target date: 06/28/2024  Pt able to safely lift 50# box from waist to eye level with no sh. Pain or limitations to promote return to work.  Baseline: 30# box lift/ carry with good technique.   Goal status: Not met  2.  Pt will improve L shoulder MMT scores to at least 4/5 to demonstrate improvement in L shoulder strength. Baseline: See above Goal status: Partially met  3.  Pt will report <2/10 pain on NPS scale in order to show clinically significant improvement in pain at rest and with mobility. Baseline: increase shoulder discomfort with overhead lifting Goal status: Partially met  PLAN:  PT FREQUENCY: 1x/week  PT DURATION: 12 weeks  PLANNED INTERVENTIONS: 97164- PT Re-evaluation, 97110-Therapeutic exercises, 97530- Therapeutic activity, 97112- Neuromuscular re-education, 97140- Manual therapy, Joint mobilization, Cryotherapy, and Moist heat  PLAN FOR NEXT SESSION:  Progress UE strengthening/ functional reaching.  Work-related tasks  Lendell Quarry, PT, DPT # [phone] Physical Therapist - [place]  [facility] 8:22 PM,04/05/24

## 2024-04-09 ENCOUNTER — Encounter: Payer: Self-pay | Admitting: Physical Therapy

## 2024-04-09 ENCOUNTER — Ambulatory Visit: Admitting: Physical Therapy

## 2024-04-09 DIAGNOSIS — M25612 Stiffness of left shoulder, not elsewhere classified: Secondary | ICD-10-CM

## 2024-04-09 DIAGNOSIS — Z9889 Other specified postprocedural states: Secondary | ICD-10-CM | POA: Diagnosis not present

## 2024-04-09 DIAGNOSIS — G8929 Other chronic pain: Secondary | ICD-10-CM

## 2024-04-09 NOTE — Therapy (Signed)
 OUTPATIENT PHYSICAL THERAPY SHOULDER TREATMENT  Patient Name: Hannah Cross MRN: [id number] DOB:September 27, 1956, 68 y.o., female Today's Date: 04/09/2024  END OF SESSION:  PT End of Session - 04/09/24 1005     Visit Number 31    Number of Visits 42    Date for PT Re-Evaluation 06/28/24    PT Start Time 0901    PT Stop Time 0940    PT Time Calculation (min) 39 min            Past Surgical History:  Procedure Laterality Date   ABDOMINAL HYSTERECTOMY     OOPHORECTOMY     Patient Active Problem List   Diagnosis Date Noted   Syncope 08/06/2019    PCP: Claretta Croft, NP  REFERRING PROVIDER: Pam Bode, MD  REFERRING DIAG: Incomplete rotator cuff tear or rupture of left shoulder, not specified as traumatic; adhesive capsulitis of left shoulder  THERAPY DIAG:  S/P arthroscopy of left shoulder  Chronic left shoulder pain  Stiffness of left shoulder, not elsewhere classified  S/P left rotator cuff repair  Rationale for Evaluation and Treatment: Rehabilitation  ONSET DATE: 10/13/2023    SUBJECTIVE:                                                                                                                                                                                      SUBJECTIVE STATEMENT: Pt reports to PT s/p L shoulder arthroscopy with rotator cuff repair, extensive debridement, tenodesis of long tendon of biceps, decompression of subacromial space with partial acromioplasty and distal claviculectomy (10/13/2023). Pt reports dealing with chronic bilateral shoulder pain for most of this year and received injections in both shoulders (L side in July); pt reported improvement in pain with her R shoulder but no improvement with her left, which is what resulted in her having the surgery. She says her pain is usually worse at night and she is having trouble sleeping because of the pain. Pt reports all her sensation has returned since the numbing  block wore off earlier this week.  Hand dominance: Right  PERTINENT HISTORY: Adhesive capsulitis, stress fx of L fibula (September 2024)  PAIN:  Are you having pain? Yes: NPRS scale: 7/10 now, 9/10 worst, 2/10 best Pain location: L shoulder Pain description: Sharp, burning Aggravating factors: Movement, having shoulder at rest without support Relieving factors: Ibuprofen  PRECAUTIONS: Shoulder  RED FLAGS: None   WEIGHT BEARING RESTRICTIONS: Yes NWB LUE for first 6 weeks after surgery  FALLS:  Has patient fallen in last 6 months? No  LIVING ENVIRONMENT: Lives with: lives with their spouse Lives in: House/apartment Stairs: Yes: External: 3 steps; none Has  following equipment at home: None  OCCUPATION: Safety Specialist  PLOF: Independent  PATIENT GOALS:Get normal ROM back with no pain, get back to cooking for her family  NEXT MD VISIT: TBD  OBJECTIVE:  Note: Objective measures were completed at Evaluation unless otherwise noted.  DIAGNOSTIC FINDINGS:  N/A  PATIENT SURVEYS:  FOTO 41/63  COGNITION: Overall cognitive status: Within functional limits for tasks assessed     SENSATION: Not tested (pt reports sensation has returned since numbing block wore off)  POSTURE: No significant postural limitations noted  UPPER EXTREMITY ROM:   Passive/Active ROM Right Eval (Active)/In sitting Left eval  Shoulder flexion 166 90 (abiding by protocol)  Shoulder extension 75   Shoulder abduction 160   Shoulder adduction    Shoulder internal rotation [facility]   Shoulder external rotation WFL ~10 degrees (abiding by protocol)  Elbow flexion [facility] WFL  Elbow extension [facility] WFL  Wrist flexion    Wrist extension    Wrist ulnar deviation    Wrist radial deviation    Wrist pronation    Wrist supination    (Blank rows = not tested)  UPPER EXTREMITY MMT:  MMT Right eval Left eval  Shoulder flexion 5   Shoulder extension 5   Shoulder abduction 5   Shoulder adduction     Shoulder internal rotation 5   Shoulder external rotation 4   Middle trapezius    Lower trapezius    Elbow flexion    Elbow extension    Wrist flexion    Wrist extension    Wrist ulnar deviation    Wrist radial deviation    Wrist pronation    Wrist supination    Grip strength (lbs) 36.9 lbs average   (Blank rows = not tested)  12/17: Grip strength: R 51#, L 32#.     UPPER EXTREMITY ROM: Reassessment of B shoulder AROM   Active ROM Right  (Active)/In sitting Left   Shoulder flexion 168 deg. 145 deg.  Shoulder extension 65 deg. 45 deg.  Shoulder abduction 158 deg. 131 deg.   Shoulder adduction      Shoulder internal rotation 82 deg. 58 deg.   Shoulder external rotation 75 deg. 64 deg.  Elbow flexion [facility] WFL  Elbow extension [facility] WFL  Wrist flexion      Wrist extension      Wrist ulnar deviation      Wrist radial deviation      Wrist pronation      Wrist supination      (Blank rows = not tested)   UPPER EXTREMITY MMT: Reassessment of B shoulder MMT   MMT Right eval Left eval  Shoulder flexion 4-  4  Shoulder extension 4-  3  Shoulder abduction 4  3+  Shoulder adduction      Shoulder internal rotation 5  4  Shoulder external rotation 4-  3+  Middle trapezius  3  4  Lower trapezius      Elbow flexion      Elbow extension      Wrist flexion      Wrist extension      Wrist ulnar deviation      Wrist radial deviation      Wrist pronation      Wrist supination      Grip strength (lbs) 59.8#  44.6#  (Blank rows = not tested)  Seated L shoulder flexion (141 deg.), abduction (136).  Good scapular motion with shoulder abduction.  Supine L shoulder IR (  60 deg.), ER (46 deg.)- tight/ guarded today.  Increase L shoulder ER AROM to 62 deg.    TODAY'S TREATMENT:                                                                                                                                         DATE: 04/09/2024    Subjective:  Pt. Reports having a nice weekend at  [place], [place]. Pt. Reports no L shoulder pain prior to tx. And reports she did have sh. Discomfort while reaching/ picking up Windex bottle.  Needs to be able to lift/ carry a 50# box of parts and be able to push a heavy cart in order to return to work.  Pt. Will be out of work for next 3 months per MD order and will focus on strengthening/ pain mgmt.  Pt. Continues to be compliant with gym based ex.     There.ex.:   B UBE 3 min. F/b.  Discussed gym based ex. (Pt. Doing low resistance).     Standing wall ladder: flexion flexion/ scaption 5x each.  1 rung above blue tape mark.   Standing wt. Wand ex.: B sh. Extension/ IR/ chest press/ sh. Flexion 20x (mirror feedback).    Nautilus: seated lat. Pull down 50#/ standing tricep extension 30#/ standing scap. Retraction 40#/ chest press 40#/30# 15x2.    Standing 5# bicep curls (focus on eccentric muscle control)/ shoulder circles 20x (mirror feedback).    Reassessment of L shoulder overhead reaching.    No supine ex. Today.  No change to HEP.  Pt. Will attend aquatic ex. Tomorrow.     PATIENT EDUCATION: Education details: Role of PT, PT plan of care, icing/ HEP with GTB.   Person educated: Patient Education method: Medical illustrator Education comprehension: verbalized understanding  HOME EXERCISE PROGRAM: Access Code: [email or web address] URL: [email or web address] Date: 10/26/2023 Prepared by: Hazeline Lister Exercises - Seated Scapular Retraction - 2 x daily - 7 x weekly - 1 sets - 10 reps - Seated Cervical Rotation AROM - 2 x daily - 7 x weekly - 1 sets - 10 reps - Seated Cervical Sidebending AROM - 2 x daily - 7 x weekly - 1 sets - 10 reps - Standing Circular Shoulder Pendulum Supported with Arm Bent - 2 x daily - 7 x weekly - 1 sets - 10 reps - Seated Elbow Flexion AAROM - 2 x daily - 7 x weekly - 1 sets - 10 reps - Seated AAROM Elbow Supination/Pronation with Clasped Hands - 2 x daily - 7 x weekly - 1 sets - 10 reps  Access  Code: [email or web address] URL: [email or web address] Date: 10/28/2023 Prepared by: Hazeline Lister  Exercises - Seated Scapular Retraction  - 2 x daily - 7 x weekly - 1 sets - 10 reps - Seated Cervical Rotation AROM  - 2 x daily - 7 x weekly - 1 sets - 10  reps - Seated Cervical Sidebending AROM  - 2 x daily - 7 x weekly - 1 sets - 10 reps - Standing Circular Shoulder Pendulum Supported with Arm Bent  - 2 x daily - 7 x weekly - 1 sets - 10 reps - Seated Elbow Flexion AAROM  - 2 x daily - 7 x weekly - 1 sets - 10 reps - Seated Shoulder Flexion Self PROM  - 3 x daily - 7 x weekly - 1 sets - 10 reps - 5-10 second hold - Supine Shoulder External Rotation with Dowel  - 3 x daily - 7 x weekly - 1 sets - 10 reps - 5-10 seconds hold  Access Code: [email or web address] URL: [email or web address] Date: 11/16/2023 Prepared by: Hazeline Lister  Exercises - Seated Cervical Sidebending AROM  - 2 x daily - 7 x weekly - 1 sets - 10 reps - Circular Shoulder Pendulum with Table Support  - 2 x daily - 7 x weekly - 1 sets - 10 reps - Seated Shoulder Flexion AAROM with Pulley Behind  - 2 x daily - 7 x weekly - 1 sets - 20 reps - Seated Shoulder Scaption AAROM with Pulley at Side  - 2 x daily - 7 x weekly - 1 sets - 20 reps - Seated Shoulder Abduction AAROM with Pulley Behind  - 2 x daily - 7 x weekly - 1 sets - 20 reps - Supine Shoulder External Rotation in 45 Degrees Abduction AAROM with Dowel  - 2 x daily - 7 x weekly - 1 sets - 10 reps  Access Code: [email or web address] URL: [email or web address] Date: 12/12/2023 Prepared by: Hazeline Lister  Exercises - Seated Shoulder Flexion AAROM with Pulley Behind  - 2 x daily - 7 x weekly - 1 sets - 20 reps - Seated Shoulder Scaption AAROM with Pulley at Side  - 2 x daily - 7 x weekly - 1 sets - 20 reps - Seated Shoulder Abduction AAROM with Pulley Behind  - 2 x daily - 7 x weekly - 1 sets - 20 reps - Standing Shoulder Flexion Wall Walk  - 2 x daily - 7 x  weekly - 2 sets - 10 reps - Sidelying Shoulder External Rotation  - 2 x daily - 7 x weekly - 2 sets - 10 reps - Supine Pectoralis Stretch  - 1 x daily - 7 x weekly - 1 sets - 10 reps - Standing Isometric Shoulder External Rotation with Doorway  - 1 x daily - 7 x weekly - 2 sets - 10 reps - 5 seconds hold - Standing Isometric Shoulder Flexion with Doorway - Arm Bent  - 1 x daily - 7 x weekly - 2 sets - 10 reps - 5 seconds hold.  Access Code: [email or web address] URL: [email or web address] Date: 01/19/2024 Prepared by: Hazeline Lister  Exercises - Shoulder W - External Rotation with Resistance  - 1 x daily - 3-4 x weekly - 3 sets - 10 reps - Shoulder extension with resistance - Neutral  - 1 x daily - 3-4 x weekly - 3 sets - 10 reps - Doorway Pec Stretch at 60 Elevation  - 1 x daily - 3-4 x weekly - 3 sets - 10 reps - Squatting High Shoulder Row with Resistance  - 1 x daily - 3-4 x weekly - 3 sets - 10 reps - Standing Tricep Extensions with Resistance  - 1 x daily - 3-4 x weekly - 3 sets - 10 reps - Standing Upright Shoulder Row with  Resistance  - 1 x daily - 3-4 x weekly - 3 sets - 10 reps - Standing Single Arm Bicep Curls Supinated with Dumbbell  - 1 x daily - 3-4 x weekly - 3 sets - 10 reps  ASSESSMENT:  CLINICAL IMPRESSION:    PT tx. session focused on L shoulder ROM, stability and pain mgmt.  Pt. Demonstrates good technique with standing resisted there.ex. at Nautilus and muscle fatigue noted.  No L UT compensatory movement pattern.  Good L/R scapular movement with overhead reaching.  Pt. Unable to complete work-related tasks at this time.   Pt. Will continue with more independent HEP and gym based/ aquatic ex.    Pt will benefit from continued PT services 1x/week with combined gym based there.ex. to increase L shoulder ROM/ stability to improve pain-free mobility.    OBJECTIVE IMPAIRMENTS: decreased ROM, decreased strength, hypomobility, impaired UE functional use, and pain.   ACTIVITY  LIMITATIONS: carrying, bathing, dressing, reach over head, and hygiene/grooming  PARTICIPATION LIMITATIONS: meal prep, cleaning, laundry, driving, community activity, occupation, and yard work  PERSONAL FACTORS: Age and 1 comorbidity: adhesive capsulitis  are also affecting patient's functional outcome.   REHAB POTENTIAL: Good  CLINICAL DECISION MAKING: Stable/uncomplicated  EVALUATION COMPLEXITY: Low  GOALS:  LONG TERM GOALS: Target date: 06/28/2024  Pt able to safely lift 50# box from waist to eye level with no sh. Pain or limitations to promote return to work.  Baseline: 30# box lift/ carry with good technique.   Goal status: Not met  2.  Pt will improve L shoulder MMT scores to at least 4/5 to demonstrate improvement in L shoulder strength. Baseline: See above Goal status: Partially met  3.  Pt will report <2/10 pain on NPS scale in order to show clinically significant improvement in pain at rest and with mobility. Baseline: increase shoulder discomfort with overhead lifting Goal status: Partially met  PLAN:  PT FREQUENCY: 1x/week  PT DURATION: 12 weeks  PLANNED INTERVENTIONS: 97164- PT Re-evaluation, 97110-Therapeutic exercises, 97530- Therapeutic activity, 97112- Neuromuscular re-education, 97140- Manual therapy, Joint mobilization, Cryotherapy, and Moist heat  PLAN FOR NEXT SESSION:  Progress UE strengthening/ functional reaching.  Work-related tasks  Lendell Quarry, PT, DPT # [phone] Physical Therapist - [place]  [facility] 10:06 AM,04/09/24

## 2024-04-18 ENCOUNTER — Ambulatory Visit: Attending: Hand Surgery | Admitting: Physical Therapy

## 2024-04-18 ENCOUNTER — Encounter: Payer: Self-pay | Admitting: Physical Therapy

## 2024-04-18 DIAGNOSIS — Z9889 Other specified postprocedural states: Secondary | ICD-10-CM | POA: Insufficient documentation

## 2024-04-18 DIAGNOSIS — G8929 Other chronic pain: Secondary | ICD-10-CM | POA: Diagnosis present

## 2024-04-18 DIAGNOSIS — M25512 Pain in left shoulder: Secondary | ICD-10-CM | POA: Diagnosis present

## 2024-04-18 DIAGNOSIS — M25612 Stiffness of left shoulder, not elsewhere classified: Secondary | ICD-10-CM | POA: Diagnosis present

## 2024-04-18 NOTE — Therapy (Signed)
 OUTPATIENT PHYSICAL THERAPY SHOULDER TREATMENT  Patient Name: Hannah Cross MRN: [id number] DOB:[birth year], 68 y.o., female Today's Date: 04/18/2024  END OF SESSION:  PT End of Session - 04/18/24 0949     Visit Number 32    Number of Visits 42    Date for PT Re-Evaluation 06/28/24    PT Start Time 0945    PT Stop Time 1037    PT Time Calculation (min) 52 min            Past Surgical History:  Procedure Laterality Date   ABDOMINAL HYSTERECTOMY     OOPHORECTOMY     Patient Active Problem List   Diagnosis Date Noted   Syncope 08/06/2019    PCP: Claretta Croft, NP  REFERRING PROVIDER: Pam Bode, MD  REFERRING DIAG: Incomplete rotator cuff tear or rupture of left shoulder, not specified as traumatic; adhesive capsulitis of left shoulder  THERAPY DIAG:  S/P arthroscopy of left shoulder  Chronic left shoulder pain  Stiffness of left shoulder, not elsewhere classified  S/P left rotator cuff repair  Rationale for Evaluation and Treatment: Rehabilitation  ONSET DATE: 10/13/2023    SUBJECTIVE:                                                                                                                                                                                      SUBJECTIVE STATEMENT: Pt reports to PT s/p L shoulder arthroscopy with rotator cuff repair, extensive debridement, tenodesis of long tendon of biceps, decompression of subacromial space with partial acromioplasty and distal claviculectomy (10/13/2023). Pt reports dealing with chronic bilateral shoulder pain for most of this year and received injections in both shoulders (L side in July); pt reported improvement in pain with her R shoulder but no improvement with her left, which is what resulted in her having the surgery. She says her pain is usually worse at night and she is having trouble sleeping because of the pain. Pt reports all her sensation has returned since the numbing  block wore off earlier this week.  Hand dominance: Right  PERTINENT HISTORY: Adhesive capsulitis, stress fx of L fibula (September 2024)  PAIN:  Are you having pain? Yes: NPRS scale: 7/10 now, 9/10 worst, 2/10 best Pain location: L shoulder Pain description: Sharp, burning Aggravating factors: Movement, having shoulder at rest without support Relieving factors: Ibuprofen  PRECAUTIONS: Shoulder  RED FLAGS: None   WEIGHT BEARING RESTRICTIONS: Yes NWB LUE for first 6 weeks after surgery  FALLS:  Has patient fallen in last 6 months? No  LIVING ENVIRONMENT: Lives with: lives with their spouse Lives in: House/apartment Stairs: Yes: External: 3 steps; none Has  following equipment at home: None  OCCUPATION: Safety Specialist  PLOF: Independent  PATIENT GOALS:Get normal ROM back with no pain, get back to cooking for her family  NEXT MD VISIT: TBD  OBJECTIVE:  Note: Objective measures were completed at Evaluation unless otherwise noted.  DIAGNOSTIC FINDINGS:  N/A  PATIENT SURVEYS:  FOTO 41/63  COGNITION: Overall cognitive status: Within functional limits for tasks assessed     SENSATION: Not tested (pt reports sensation has returned since numbing block wore off)  POSTURE: No significant postural limitations noted  UPPER EXTREMITY ROM:   Passive/Active ROM Right Eval (Active)/In sitting Left eval  Shoulder flexion 166 90 (abiding by protocol)  Shoulder extension 75   Shoulder abduction 160   Shoulder adduction    Shoulder internal rotation [facility]   Shoulder external rotation WFL ~10 degrees (abiding by protocol)  Elbow flexion [facility] WFL  Elbow extension [facility] WFL  Wrist flexion    Wrist extension    Wrist ulnar deviation    Wrist radial deviation    Wrist pronation    Wrist supination    (Blank rows = not tested)  UPPER EXTREMITY MMT:  MMT Right eval Left eval  Shoulder flexion 5   Shoulder extension 5   Shoulder abduction 5   Shoulder adduction     Shoulder internal rotation 5   Shoulder external rotation 4   Middle trapezius    Lower trapezius    Elbow flexion    Elbow extension    Wrist flexion    Wrist extension    Wrist ulnar deviation    Wrist radial deviation    Wrist pronation    Wrist supination    Grip strength (lbs) 36.9 lbs average   (Blank rows = not tested)  12/17: Grip strength: R 51#, L 32#.     UPPER EXTREMITY ROM: Reassessment of B shoulder AROM   Active ROM Right  (Active)/In sitting Left   Shoulder flexion 168 deg. 145 deg.  Shoulder extension 65 deg. 45 deg.  Shoulder abduction 158 deg. 131 deg.   Shoulder adduction      Shoulder internal rotation 82 deg. 58 deg.   Shoulder external rotation 75 deg. 64 deg.  Elbow flexion [facility] WFL  Elbow extension [facility] WFL  Wrist flexion      Wrist extension      Wrist ulnar deviation      Wrist radial deviation      Wrist pronation      Wrist supination      (Blank rows = not tested)   UPPER EXTREMITY MMT: Reassessment of B shoulder MMT   MMT Right eval Left eval  Shoulder flexion 4-  4  Shoulder extension 4-  3  Shoulder abduction 4  3+  Shoulder adduction      Shoulder internal rotation 5  4  Shoulder external rotation 4-  3+  Middle trapezius  3  4  Lower trapezius      Elbow flexion      Elbow extension      Wrist flexion      Wrist extension      Wrist ulnar deviation      Wrist radial deviation      Wrist pronation      Wrist supination      Grip strength (lbs) 59.8#  44.6#  (Blank rows = not tested)  Seated L shoulder flexion (141 deg.), abduction (136).  Good scapular motion with shoulder abduction.  Supine L shoulder IR (  60 deg.), ER (46 deg.)- tight/ guarded today.  Increase L shoulder ER AROM to 62 deg.    TODAY'S TREATMENT:                                                                                                                                         DATE: 04/18/2024    Subjective:  Pt. Reports having a nice weekend at [employer] at [employer]. Pt. Reports no L shoulder pain prior to tx.  Pt. States she is trying to do more at home but remains limited with lifting tasks due to shoulder soreness.  Pt. needs to be able to lift/ carry a 50# box of parts and be able to push a heavy cart in order to return to work.  Pt. Will be out of work for next 3 months per MD order and will focus on strengthening/ pain mgmt.  Pt. Continues to be compliant with gym based ex.     There.ex.:   B UBE 3 min. F/b.  Discussed gym based ex. (Pt. Doing low resistance).     Grip strength: R 61.8#, L 46.1#  Standing wt. Wand ex.: B sh. Extension/ IR/ chest press/ sh. Flexion/ abduction 20x (mirror feedback).    Nautilus (handles): seated lat. Pull down 50#/ standing tricep extension 30#/ standing scap. Retraction 40#/ bicep 20# 20x2.    There.act.:  Reassessment of L shoulder overhead reaching in sitting.  Standing Blaze Pods at mirror (varying positions)- 1 min. X 2.    Standing functional reaching tasks (all planes).     No supine ex. Today.  No change to HEP.  Pt. Will continue with gym/ aquatic based there.ex.       PATIENT EDUCATION: Education details: Role of PT, PT plan of care, icing/ HEP with GTB.   Person educated: Patient Education method: Medical illustrator Education comprehension: verbalized understanding  HOME EXERCISE PROGRAM: Access Code: [email or web address] URL: [email or web address] Date: 10/26/2023 Prepared by: Hazeline Lister Exercises - Seated Scapular Retraction - 2 x daily - 7 x weekly - 1 sets - 10 reps - Seated Cervical Rotation AROM - 2 x daily - 7 x weekly - 1 sets - 10 reps - Seated Cervical Sidebending AROM - 2 x daily - 7 x weekly - 1 sets - 10 reps - Standing Circular Shoulder Pendulum Supported with Arm Bent - 2 x daily - 7 x weekly - 1 sets - 10 reps - Seated Elbow Flexion AAROM - 2 x daily - 7 x weekly - 1 sets - 10 reps - Seated AAROM Elbow Supination/Pronation with  Clasped Hands - 2 x daily - 7 x weekly - 1 sets - 10 reps  Access Code: [email or web address] URL: [email or web address] Date: 10/28/2023 Prepared by: Hazeline Lister  Exercises - Seated Scapular Retraction  - 2 x daily - 7 x weekly - 1 sets - 10 reps -  Seated Cervical Rotation AROM  - 2 x daily - 7 x weekly - 1 sets - 10 reps - Seated Cervical Sidebending AROM  - 2 x daily - 7 x weekly - 1 sets - 10 reps - Standing Circular Shoulder Pendulum Supported with Arm Bent  - 2 x daily - 7 x weekly - 1 sets - 10 reps - Seated Elbow Flexion AAROM  - 2 x daily - 7 x weekly - 1 sets - 10 reps - Seated Shoulder Flexion Self PROM  - 3 x daily - 7 x weekly - 1 sets - 10 reps - 5-10 second hold - Supine Shoulder External Rotation with Dowel  - 3 x daily - 7 x weekly - 1 sets - 10 reps - 5-10 seconds hold  Access Code: [email or web address] URL: [email or web address] Date: 11/16/2023 Prepared by: Hazeline Lister  Exercises - Seated Cervical Sidebending AROM  - 2 x daily - 7 x weekly - 1 sets - 10 reps - Circular Shoulder Pendulum with Table Support  - 2 x daily - 7 x weekly - 1 sets - 10 reps - Seated Shoulder Flexion AAROM with Pulley Behind  - 2 x daily - 7 x weekly - 1 sets - 20 reps - Seated Shoulder Scaption AAROM with Pulley at Side  - 2 x daily - 7 x weekly - 1 sets - 20 reps - Seated Shoulder Abduction AAROM with Pulley Behind  - 2 x daily - 7 x weekly - 1 sets - 20 reps - Supine Shoulder External Rotation in 45 Degrees Abduction AAROM with Dowel  - 2 x daily - 7 x weekly - 1 sets - 10 reps  Access Code: [email or web address] URL: [email or web address] Date: 12/12/2023 Prepared by: Hazeline Lister  Exercises - Seated Shoulder Flexion AAROM with Pulley Behind  - 2 x daily - 7 x weekly - 1 sets - 20 reps - Seated Shoulder Scaption AAROM with Pulley at Side  - 2 x daily - 7 x weekly - 1 sets - 20 reps - Seated Shoulder Abduction AAROM with Pulley Behind  - 2 x daily - 7 x weekly - 1 sets - 20  reps - Standing Shoulder Flexion Wall Walk  - 2 x daily - 7 x weekly - 2 sets - 10 reps - Sidelying Shoulder External Rotation  - 2 x daily - 7 x weekly - 2 sets - 10 reps - Supine Pectoralis Stretch  - 1 x daily - 7 x weekly - 1 sets - 10 reps - Standing Isometric Shoulder External Rotation with Doorway  - 1 x daily - 7 x weekly - 2 sets - 10 reps - 5 seconds hold - Standing Isometric Shoulder Flexion with Doorway - Arm Bent  - 1 x daily - 7 x weekly - 2 sets - 10 reps - 5 seconds hold.  Access Code: [email or web address] URL: [email or web address] Date: 01/19/2024 Prepared by: Hazeline Lister  Exercises - Shoulder W - External Rotation with Resistance  - 1 x daily - 3-4 x weekly - 3 sets - 10 reps - Shoulder extension with resistance - Neutral  - 1 x daily - 3-4 x weekly - 3 sets - 10 reps - Doorway Pec Stretch at 60 Elevation  - 1 x daily - 3-4 x weekly - 3 sets - 10 reps - Squatting High Shoulder Row with Resistance  - 1 x daily - 3-4 x weekly - 3 sets - 10 reps - Standing Tricep Extensions with Resistance  - 1  x daily - 3-4 x weekly - 3 sets - 10 reps - Standing Upright Shoulder Row with Resistance  - 1 x daily - 3-4 x weekly - 3 sets - 10 reps - Standing Single Arm Bicep Curls Supinated with Dumbbell  - 1 x daily - 3-4 x weekly - 3 sets - 10 reps  ASSESSMENT:  CLINICAL IMPRESSION:    PT tx. session focused on L shoulder ROM, stability and pain mgmt.  Pt. Demonstrates good technique with standing resisted there.ex. at Nautilus and muscle fatigue noted.  No L UT compensatory movement pattern.  Good L/R scapular movement with overhead/ functional reaching.  Pt. Unable to complete work-related tasks at this time.   Pt. Will continue with more independent HEP and gym based/ aquatic ex.    Pt will benefit from continued PT services 1x/week with combined gym based there.ex. to increase L shoulder ROM/ stability to improve pain-free mobility.    OBJECTIVE IMPAIRMENTS: decreased ROM, decreased  strength, hypomobility, impaired UE functional use, and pain.   ACTIVITY LIMITATIONS: carrying, bathing, dressing, reach over head, and hygiene/grooming  PARTICIPATION LIMITATIONS: meal prep, cleaning, laundry, driving, community activity, occupation, and yard work  PERSONAL FACTORS: Age and 1 comorbidity: adhesive capsulitis  are also affecting patient's functional outcome.   REHAB POTENTIAL: Good  CLINICAL DECISION MAKING: Stable/uncomplicated  EVALUATION COMPLEXITY: Low  GOALS:  LONG TERM GOALS: Target date: 06/28/2024  Pt able to safely lift 50# box from waist to eye level with no sh. Pain or limitations to promote return to work.  Baseline: 30# box lift/ carry with good technique.   Goal status: Not met  2.  Pt will improve L shoulder MMT scores to at least 4/5 to demonstrate improvement in L shoulder strength. Baseline: See above Goal status: Partially met  3.  Pt will report <2/10 pain on NPS scale in order to show clinically significant improvement in pain at rest and with mobility. Baseline: increase shoulder discomfort with overhead lifting Goal status: Partially met  PLAN:  PT FREQUENCY: 1x/week  PT DURATION: 12 weeks  PLANNED INTERVENTIONS: 97164- PT Re-evaluation, 97110-Therapeutic exercises, 97530- Therapeutic activity, 97112- Neuromuscular re-education, 97140- Manual therapy, Joint mobilization, Cryotherapy, and Moist heat  PLAN FOR NEXT SESSION:  Progress UE strengthening/ functional reaching.  Work-related tasks  Lendell Quarry, PT, DPT # [phone] Physical Therapist - [place]  [facility] 12:21 PM,04/18/24

## 2024-04-24 ENCOUNTER — Ambulatory Visit: Admitting: Physical Therapy

## 2024-04-24 DIAGNOSIS — M25612 Stiffness of left shoulder, not elsewhere classified: Secondary | ICD-10-CM

## 2024-04-24 DIAGNOSIS — Z9889 Other specified postprocedural states: Secondary | ICD-10-CM

## 2024-04-24 DIAGNOSIS — G8929 Other chronic pain: Secondary | ICD-10-CM

## 2024-04-24 NOTE — Therapy (Signed)
 OUTPATIENT PHYSICAL THERAPY SHOULDER TREATMENT  Patient Name: Hannah Cross MRN: [id number] DOB:07-09-1956, 68 y.o., female Today's Date: 04/24/2024  END OF SESSION:  PT End of Session - 04/24/24 1349     Visit Number 33    Number of Visits 42    Date for PT Re-Evaluation 06/28/24    PT Start Time 1347    PT Stop Time 1430    PT Time Calculation (min) 43 min            Past Surgical History:  Procedure Laterality Date   ABDOMINAL HYSTERECTOMY     OOPHORECTOMY     Patient Active Problem List   Diagnosis Date Noted   Syncope 08/06/2019    PCP: Claretta Croft, NP  REFERRING PROVIDER: Pam Bode, MD  REFERRING DIAG: Incomplete rotator cuff tear or rupture of left shoulder, not specified as traumatic; adhesive capsulitis of left shoulder  THERAPY DIAG:  S/P arthroscopy of left shoulder  Chronic left shoulder pain  Stiffness of left shoulder, not elsewhere classified  S/P left rotator cuff repair  Rationale for Evaluation and Treatment: Rehabilitation  ONSET DATE: 10/13/2023    SUBJECTIVE:                                                                                                                                                                                      SUBJECTIVE STATEMENT: Pt reports to PT s/p L shoulder arthroscopy with rotator cuff repair, extensive debridement, tenodesis of long tendon of biceps, decompression of subacromial space with partial acromioplasty and distal claviculectomy (10/13/2023). Pt reports dealing with chronic bilateral shoulder pain for most of this year and received injections in both shoulders (L side in July); pt reported improvement in pain with her R shoulder but no improvement with her left, which is what resulted in her having the surgery. She says her pain is usually worse at night and she is having trouble sleeping because of the pain. Pt reports all her sensation has returned since the numbing  block wore off earlier this week.  Hand dominance: Right  PERTINENT HISTORY: Adhesive capsulitis, stress fx of L fibula (September 2024)  PAIN:  Are you having pain? Yes: NPRS scale: 7/10 now, 9/10 worst, 2/10 best Pain location: L shoulder Pain description: Sharp, burning Aggravating factors: Movement, having shoulder at rest without support Relieving factors: Ibuprofen  PRECAUTIONS: Shoulder  RED FLAGS: None   WEIGHT BEARING RESTRICTIONS: Yes NWB LUE for first 6 weeks after surgery  FALLS:  Has patient fallen in last 6 months? No  LIVING ENVIRONMENT: Lives with: lives with their spouse Lives in: House/apartment Stairs: Yes: External: 3 steps; none Has  following equipment at home: None  OCCUPATION: Safety Specialist  PLOF: Independent  PATIENT GOALS:Get normal ROM back with no pain, get back to cooking for her family  NEXT MD VISIT: TBD  OBJECTIVE:  Note: Objective measures were completed at Evaluation unless otherwise noted.  DIAGNOSTIC FINDINGS:  N/A  PATIENT SURVEYS:  FOTO 41/63  COGNITION: Overall cognitive status: Within functional limits for tasks assessed     SENSATION: Not tested (pt reports sensation has returned since numbing block wore off)  POSTURE: No significant postural limitations noted  UPPER EXTREMITY ROM:   Passive/Active ROM Right Eval (Active)/In sitting Left eval  Shoulder flexion 166 90 (abiding by protocol)  Shoulder extension 75   Shoulder abduction 160   Shoulder adduction    Shoulder internal rotation [facility]   Shoulder external rotation WFL ~10 degrees (abiding by protocol)  Elbow flexion [facility] WFL  Elbow extension [facility] WFL  Wrist flexion    Wrist extension    Wrist ulnar deviation    Wrist radial deviation    Wrist pronation    Wrist supination    (Blank rows = not tested)  UPPER EXTREMITY MMT:  MMT Right eval Left eval  Shoulder flexion 5   Shoulder extension 5   Shoulder abduction 5   Shoulder adduction     Shoulder internal rotation 5   Shoulder external rotation 4   Middle trapezius    Lower trapezius    Elbow flexion    Elbow extension    Wrist flexion    Wrist extension    Wrist ulnar deviation    Wrist radial deviation    Wrist pronation    Wrist supination    Grip strength (lbs) 36.9 lbs average   (Blank rows = not tested)  12/17: Grip strength: R 51#, L 32#.     UPPER EXTREMITY ROM: Reassessment of B shoulder AROM   Active ROM Right  (Active)/In sitting Left   Shoulder flexion 168 deg. 145 deg.  Shoulder extension 65 deg. 45 deg.  Shoulder abduction 158 deg. 131 deg.   Shoulder adduction      Shoulder internal rotation 82 deg. 58 deg.   Shoulder external rotation 75 deg. 64 deg.  Elbow flexion [facility] WFL  Elbow extension [facility] WFL  Wrist flexion      Wrist extension      Wrist ulnar deviation      Wrist radial deviation      Wrist pronation      Wrist supination      (Blank rows = not tested)   UPPER EXTREMITY MMT: Reassessment of B shoulder MMT   MMT Right eval Left eval  Shoulder flexion 4-  4  Shoulder extension 4-  3  Shoulder abduction 4  3+  Shoulder adduction      Shoulder internal rotation 5  4  Shoulder external rotation 4-  3+  Middle trapezius  3  4  Lower trapezius      Elbow flexion      Elbow extension      Wrist flexion      Wrist extension      Wrist ulnar deviation      Wrist radial deviation      Wrist pronation      Wrist supination      Grip strength (lbs) 59.8#  44.6#  (Blank rows = not tested)  Seated L shoulder flexion (141 deg.), abduction (136).  Good scapular motion with shoulder abduction.  Supine L shoulder IR (  60 deg.), ER (46 deg.)- tight/ guarded today.  Increase L shoulder ER AROM to 62 deg.    Grip strength: R 61.8#, L 46.1#  TODAY'S TREATMENT:                                                                                                                                         DATE: 04/24/2024    Subjective:  Pt.  States she has difficulty picking up granddaughter (30#).  Pt. Will be out of work for next 2+ months per MD order and will focus on strengthening/ pain mgmt.  Pt. Continues to be compliant with gym based ex.     There.ex.:   B UBE 3.5 min. F/b.  Discussed gym based ex. (Pt. Doing low resistance).     Standing:  B sh. Extension/ ER/ IR/ chest press/ sh. Flexion/ abduction 20x (mirror feedback).    Nautilus (handles): standing lat. Pull down 60#/ standing tricep extension 40# 20x (difficulty), 30# 20x/ standing scap. Retraction 40#/ bicep 20# 20x2.    Standing sh. Flexion with green ball at wall 10x.    Reassessment of shoulder AROM in seated/standing position.    There.act.:  No charge  Reassessment of L shoulder overhead reaching in sitting.  Standing Blaze Pods at mirror (varying positions)- 1 min. X 2.      PATIENT EDUCATION: Education details: Role of PT, PT plan of care, icing/ HEP with GTB.   Person educated: Patient Education method: Medical illustrator Education comprehension: verbalized understanding  HOME EXERCISE PROGRAM: Access Code: [email or web address] URL: [email or web address] Date: 10/26/2023 Prepared by: Hazeline Lister Exercises - Seated Scapular Retraction - 2 x daily - 7 x weekly - 1 sets - 10 reps - Seated Cervical Rotation AROM - 2 x daily - 7 x weekly - 1 sets - 10 reps - Seated Cervical Sidebending AROM - 2 x daily - 7 x weekly - 1 sets - 10 reps - Standing Circular Shoulder Pendulum Supported with Arm Bent - 2 x daily - 7 x weekly - 1 sets - 10 reps - Seated Elbow Flexion AAROM - 2 x daily - 7 x weekly - 1 sets - 10 reps - Seated AAROM Elbow Supination/Pronation with Clasped Hands - 2 x daily - 7 x weekly - 1 sets - 10 reps  Access Code: [email or web address] URL: [email or web address] Date: 10/28/2023 Prepared by: Hazeline Lister  Exercises - Seated Scapular Retraction  - 2 x daily - 7 x weekly - 1 sets - 10 reps - Seated Cervical Rotation AROM  -  2 x daily - 7 x weekly - 1 sets - 10 reps - Seated Cervical Sidebending AROM  - 2 x daily - 7 x weekly - 1 sets - 10 reps - Standing Circular Shoulder Pendulum Supported with Arm Bent  - 2 x daily - 7 x weekly - 1 sets - 10 reps - Seated Elbow Flexion  AAROM  - 2 x daily - 7 x weekly - 1 sets - 10 reps - Seated Shoulder Flexion Self PROM  - 3 x daily - 7 x weekly - 1 sets - 10 reps - 5-10 second hold - Supine Shoulder External Rotation with Dowel  - 3 x daily - 7 x weekly - 1 sets - 10 reps - 5-10 seconds hold  Access Code: [email or web address] URL: [email or web address] Date: 11/16/2023 Prepared by: Hazeline Lister  Exercises - Seated Cervical Sidebending AROM  - 2 x daily - 7 x weekly - 1 sets - 10 reps - Circular Shoulder Pendulum with Table Support  - 2 x daily - 7 x weekly - 1 sets - 10 reps - Seated Shoulder Flexion AAROM with Pulley Behind  - 2 x daily - 7 x weekly - 1 sets - 20 reps - Seated Shoulder Scaption AAROM with Pulley at Side  - 2 x daily - 7 x weekly - 1 sets - 20 reps - Seated Shoulder Abduction AAROM with Pulley Behind  - 2 x daily - 7 x weekly - 1 sets - 20 reps - Supine Shoulder External Rotation in 45 Degrees Abduction AAROM with Dowel  - 2 x daily - 7 x weekly - 1 sets - 10 reps  Access Code: [email or web address] URL: [email or web address] Date: 12/12/2023 Prepared by: Hazeline Lister  Exercises - Seated Shoulder Flexion AAROM with Pulley Behind  - 2 x daily - 7 x weekly - 1 sets - 20 reps - Seated Shoulder Scaption AAROM with Pulley at Side  - 2 x daily - 7 x weekly - 1 sets - 20 reps - Seated Shoulder Abduction AAROM with Pulley Behind  - 2 x daily - 7 x weekly - 1 sets - 20 reps - Standing Shoulder Flexion Wall Walk  - 2 x daily - 7 x weekly - 2 sets - 10 reps - Sidelying Shoulder External Rotation  - 2 x daily - 7 x weekly - 2 sets - 10 reps - Supine Pectoralis Stretch  - 1 x daily - 7 x weekly - 1 sets - 10 reps - Standing Isometric Shoulder External  Rotation with Doorway  - 1 x daily - 7 x weekly - 2 sets - 10 reps - 5 seconds hold - Standing Isometric Shoulder Flexion with Doorway - Arm Bent  - 1 x daily - 7 x weekly - 2 sets - 10 reps - 5 seconds hold.  Access Code: [email or web address] URL: [email or web address] Date: 01/19/2024 Prepared by: Hazeline Lister  Exercises - Shoulder W - External Rotation with Resistance  - 1 x daily - 3-4 x weekly - 3 sets - 10 reps - Shoulder extension with resistance - Neutral  - 1 x daily - 3-4 x weekly - 3 sets - 10 reps - Doorway Pec Stretch at 60 Elevation  - 1 x daily - 3-4 x weekly - 3 sets - 10 reps - Squatting High Shoulder Row with Resistance  - 1 x daily - 3-4 x weekly - 3 sets - 10 reps - Standing Tricep Extensions with Resistance  - 1 x daily - 3-4 x weekly - 3 sets - 10 reps - Standing Upright Shoulder Row with Resistance  - 1 x daily - 3-4 x weekly - 3 sets - 10 reps - Standing Single Arm Bicep Curls Supinated with Dumbbell  - 1 x daily - 3-4 x weekly - 3 sets - 10 reps  ASSESSMENT:  CLINICAL IMPRESSION:    PT  tx. session focused on L shoulder ROM, stability and pain mgmt.  Pt. Demonstrates good technique with standing resisted there.ex. at Nautilus and muscle fatigue noted.  No L UT compensatory movement pattern.  Good L/R scapular movement with overhead/ functional reaching.  Pt. Unable to complete work-related tasks at this time and pain/ fatigue limited with repetitive overhead reaching with Blaze pods.   Pt. Will continue with more independent HEP and gym based/ aquatic ex.    Pt will benefit from continued PT services 1x/week with combined gym based there.ex. to increase L shoulder ROM/ stability to improve pain-free mobility.    OBJECTIVE IMPAIRMENTS: decreased ROM, decreased strength, hypomobility, impaired UE functional use, and pain.   ACTIVITY LIMITATIONS: carrying, bathing, dressing, reach over head, and hygiene/grooming  PARTICIPATION LIMITATIONS: meal prep, cleaning,  laundry, driving, community activity, occupation, and yard work  PERSONAL FACTORS: Age and 1 comorbidity: adhesive capsulitis are also affecting patient's functional outcome.   REHAB POTENTIAL: Good  CLINICAL DECISION MAKING: Stable/uncomplicated  EVALUATION COMPLEXITY: Low  GOALS:  LONG TERM GOALS: Target date: 06/28/2024  Pt able to safely lift 50# box from waist to eye level with no sh. Pain or limitations to promote return to work.  Baseline: 30# box lift/ carry with good technique.   Goal status: Not met  2.  Pt will improve L shoulder MMT scores to at least 4/5 to demonstrate improvement in L shoulder strength. Baseline: See above Goal status: Partially met  3.  Pt will report <2/10 pain on NPS scale in order to show clinically significant improvement in pain at rest and with mobility. Baseline: increase shoulder discomfort with overhead lifting Goal status: Partially met  PLAN:  PT FREQUENCY: 1x/week  PT DURATION: 12 weeks  PLANNED INTERVENTIONS: 97164- PT Re-evaluation, 97110-Therapeutic exercises, 97530- Therapeutic activity, 97112- Neuromuscular re-education, 97140- Manual therapy, Joint mobilization, Cryotherapy, and Moist heat  PLAN FOR NEXT SESSION:  Progress UE strengthening/ functional reaching.  Work-related tasks  Lendell Quarry, PT, DPT # [phone] Physical Therapist - [place]  [facility] 3:07 PM,04/24/24

## 2024-05-02 ENCOUNTER — Ambulatory Visit: Admitting: Physical Therapy

## 2024-05-02 ENCOUNTER — Encounter: Payer: Self-pay | Admitting: Physical Therapy

## 2024-05-02 DIAGNOSIS — G8929 Other chronic pain: Secondary | ICD-10-CM

## 2024-05-02 DIAGNOSIS — Z9889 Other specified postprocedural states: Secondary | ICD-10-CM | POA: Diagnosis not present

## 2024-05-02 DIAGNOSIS — M25612 Stiffness of left shoulder, not elsewhere classified: Secondary | ICD-10-CM

## 2024-05-02 NOTE — Therapy (Signed)
 OUTPATIENT PHYSICAL THERAPY SHOULDER TREATMENT  Patient Name: Hannah Cross MRN: [id number] DOB:Nov 02, 1956, 68 y.o., female Today's Date: 05/02/2024  END OF SESSION:  PT End of Session - 05/02/24 0936     Visit Number 34    Number of Visits 42    Date for PT Re-Evaluation 06/28/24    PT Start Time 0946    PT Stop Time 1030    PT Time Calculation (min) 44 min            Past Surgical History:  Procedure Laterality Date   ABDOMINAL HYSTERECTOMY     OOPHORECTOMY     Patient Active Problem List   Diagnosis Date Noted   Syncope 08/06/2019    PCP: Claretta Croft, NP  REFERRING PROVIDER: Pam Bode, MD  REFERRING DIAG: Incomplete rotator cuff tear or rupture of left shoulder, not specified as traumatic; adhesive capsulitis of left shoulder  THERAPY DIAG:  S/P arthroscopy of left shoulder  Chronic left shoulder pain  Stiffness of left shoulder, not elsewhere classified  S/P left rotator cuff repair  Rationale for Evaluation and Treatment: Rehabilitation  ONSET DATE: 10/13/2023    SUBJECTIVE:                                                                                                                                                                                      SUBJECTIVE STATEMENT: Pt reports to PT s/p L shoulder arthroscopy with rotator cuff repair, extensive debridement, tenodesis of long tendon of biceps, decompression of subacromial space with partial acromioplasty and distal claviculectomy (10/13/2023). Pt reports dealing with chronic bilateral shoulder pain for most of this year and received injections in both shoulders (L side in July); pt reported improvement in pain with her R shoulder but no improvement with her left, which is what resulted in her having the surgery. She says her pain is usually worse at night and she is having trouble sleeping because of the pain. Pt reports all her sensation has returned since the numbing  block wore off earlier this week.  Hand dominance: Right  PERTINENT HISTORY: Adhesive capsulitis, stress fx of L fibula (September 2024)  PAIN:  Are you having pain? Yes: NPRS scale: 7/10 now, 9/10 worst, 2/10 best Pain location: L shoulder Pain description: Sharp, burning Aggravating factors: Movement, having shoulder at rest without support Relieving factors: Ibuprofen  PRECAUTIONS: Shoulder  RED FLAGS: None   WEIGHT BEARING RESTRICTIONS: Yes NWB LUE for first 6 weeks after surgery  FALLS:  Has patient fallen in last 6 months? No  LIVING ENVIRONMENT: Lives with: lives with their spouse Lives in: House/apartment Stairs: Yes: External: 3 steps; none Has  following equipment at home: None  OCCUPATION: Safety Specialist  PLOF: Independent  PATIENT GOALS:Get normal ROM back with no pain, get back to cooking for her family  NEXT MD VISIT: TBD  OBJECTIVE:  Note: Objective measures were completed at Evaluation unless otherwise noted.  DIAGNOSTIC FINDINGS:  N/A  PATIENT SURVEYS:  FOTO 41/63  COGNITION: Overall cognitive status: Within functional limits for tasks assessed     SENSATION: Not tested (pt reports sensation has returned since numbing block wore off)  POSTURE: No significant postural limitations noted  UPPER EXTREMITY ROM:   Passive/Active ROM Right Eval (Active)/In sitting Left eval  Shoulder flexion 166 90 (abiding by protocol)  Shoulder extension 75   Shoulder abduction 160   Shoulder adduction    Shoulder internal rotation [facility]   Shoulder external rotation WFL ~10 degrees (abiding by protocol)  Elbow flexion [facility] WFL  Elbow extension [facility] WFL  Wrist flexion    Wrist extension    Wrist ulnar deviation    Wrist radial deviation    Wrist pronation    Wrist supination    (Blank rows = not tested)  UPPER EXTREMITY MMT:  MMT Right eval Left eval  Shoulder flexion 5   Shoulder extension 5   Shoulder abduction 5   Shoulder adduction     Shoulder internal rotation 5   Shoulder external rotation 4   Middle trapezius    Lower trapezius    Elbow flexion    Elbow extension    Wrist flexion    Wrist extension    Wrist ulnar deviation    Wrist radial deviation    Wrist pronation    Wrist supination    Grip strength (lbs) 36.9 lbs average   (Blank rows = not tested)  12/17: Grip strength: R 51#, L 32#.     UPPER EXTREMITY ROM: Reassessment of B shoulder AROM   Active ROM Right  (Active)/In sitting Left   Shoulder flexion 168 deg. 145 deg.  Shoulder extension 65 deg. 45 deg.  Shoulder abduction 158 deg. 131 deg.   Shoulder adduction      Shoulder internal rotation 82 deg. 58 deg.   Shoulder external rotation 75 deg. 64 deg.  Elbow flexion [facility] WFL  Elbow extension [facility] WFL  Wrist flexion      Wrist extension      Wrist ulnar deviation      Wrist radial deviation      Wrist pronation      Wrist supination      (Blank rows = not tested)   UPPER EXTREMITY MMT: Reassessment of B shoulder MMT   MMT Right eval Left eval  Shoulder flexion 4-  4  Shoulder extension 4-  3  Shoulder abduction 4  3+  Shoulder adduction      Shoulder internal rotation 5  4  Shoulder external rotation 4-  3+  Middle trapezius  3  4  Lower trapezius      Elbow flexion      Elbow extension      Wrist flexion      Wrist extension      Wrist ulnar deviation      Wrist radial deviation      Wrist pronation      Wrist supination      Grip strength (lbs) 59.8#  44.6#  (Blank rows = not tested)  Seated L shoulder flexion (141 deg.), abduction (136).  Good scapular motion with shoulder abduction.  Supine L shoulder IR (  60 deg.), ER (46 deg.)- tight/ guarded today.  Increase L shoulder ER AROM to 62 deg.    Grip strength: R 61.8#, L 46.1#  TODAY'S TREATMENT:                                                                                                                                         DATE: 05/02/2024    Subjective:  Pt.  Entered PT stating they are doing well.  No c/o pain and pt. Has been painting a Nurse, mental health.  Pt. Will be out of work for next 2 months per MD order and will focus on strengthening/ pain mgmt.  Pt. Continues to be compliant with gym based ex.     There.ex.:   B UBE 3 min. F/b.   Warm-up.    Standing:  B sh. Extension/ ER/ IR/ chest press/ sh. Flexion/ abduction 20x (mirror feedback).    Standing Bodyblade: flexion/ IR and ER/ behind back 2x each with mirror feedback.    Standing wt. Wand (added 2# ankle wt): sh. Flexion/ chest press 10x each.  Pt. Challenged and benefits from mirror feedback for technique.    Nautilus (handles): seated lat. Pull down 60#/ standing tricep extension 40# 10x2 (fatigue), 30# 20x/ standing scap. Retraction 40# 20x2/ bicep 20# 20x2.    Standing sh. Flexion with green ball at wall 10x.    Reassessment of shoulder AROM in seated/standing position.     NOT TODAY:  Reassessment of L shoulder overhead reaching in sitting.  Standing Blaze Pods at mirror (varying positions)- 1 min. X 2.      PATIENT EDUCATION: Education details: Role of PT, PT plan of care, icing/ HEP with GTB.   Person educated: Patient Education method: Medical illustrator Education comprehension: verbalized understanding  HOME EXERCISE PROGRAM: Access Code: [email or web address] URL: [email or web address] Date: 10/26/2023 Prepared by: Hazeline Lister Exercises - Seated Scapular Retraction - 2 x daily - 7 x weekly - 1 sets - 10 reps - Seated Cervical Rotation AROM - 2 x daily - 7 x weekly - 1 sets - 10 reps - Seated Cervical Sidebending AROM - 2 x daily - 7 x weekly - 1 sets - 10 reps - Standing Circular Shoulder Pendulum Supported with Arm Bent - 2 x daily - 7 x weekly - 1 sets - 10 reps - Seated Elbow Flexion AAROM - 2 x daily - 7 x weekly - 1 sets - 10 reps - Seated AAROM Elbow Supination/Pronation with Clasped Hands - 2 x daily - 7 x weekly - 1 sets - 10 reps  Access Code:  [email or web address] URL: [email or web address] Date: 10/28/2023 Prepared by: Hazeline Lister  Exercises - Seated Scapular Retraction  - 2 x daily - 7 x weekly - 1 sets - 10 reps - Seated Cervical Rotation AROM  - 2 x daily - 7 x weekly - 1 sets - 10 reps - Seated Cervical  Sidebending AROM  - 2 x daily - 7 x weekly - 1 sets - 10 reps - Standing Circular Shoulder Pendulum Supported with Arm Bent  - 2 x daily - 7 x weekly - 1 sets - 10 reps - Seated Elbow Flexion AAROM  - 2 x daily - 7 x weekly - 1 sets - 10 reps - Seated Shoulder Flexion Self PROM  - 3 x daily - 7 x weekly - 1 sets - 10 reps - 5-10 second hold - Supine Shoulder External Rotation with Dowel  - 3 x daily - 7 x weekly - 1 sets - 10 reps - 5-10 seconds hold  Access Code: [email or web address] URL: [email or web address] Date: 11/16/2023 Prepared by: Hazeline Lister  Exercises - Seated Cervical Sidebending AROM  - 2 x daily - 7 x weekly - 1 sets - 10 reps - Circular Shoulder Pendulum with Table Support  - 2 x daily - 7 x weekly - 1 sets - 10 reps - Seated Shoulder Flexion AAROM with Pulley Behind  - 2 x daily - 7 x weekly - 1 sets - 20 reps - Seated Shoulder Scaption AAROM with Pulley at Side  - 2 x daily - 7 x weekly - 1 sets - 20 reps - Seated Shoulder Abduction AAROM with Pulley Behind  - 2 x daily - 7 x weekly - 1 sets - 20 reps - Supine Shoulder External Rotation in 45 Degrees Abduction AAROM with Dowel  - 2 x daily - 7 x weekly - 1 sets - 10 reps  Access Code: [email or web address] URL: [email or web address] Date: 12/12/2023 Prepared by: Hazeline Lister  Exercises - Seated Shoulder Flexion AAROM with Pulley Behind  - 2 x daily - 7 x weekly - 1 sets - 20 reps - Seated Shoulder Scaption AAROM with Pulley at Side  - 2 x daily - 7 x weekly - 1 sets - 20 reps - Seated Shoulder Abduction AAROM with Pulley Behind  - 2 x daily - 7 x weekly - 1 sets - 20 reps - Standing Shoulder Flexion Wall Walk  - 2 x daily - 7 x weekly - 2  sets - 10 reps - Sidelying Shoulder External Rotation  - 2 x daily - 7 x weekly - 2 sets - 10 reps - Supine Pectoralis Stretch  - 1 x daily - 7 x weekly - 1 sets - 10 reps - Standing Isometric Shoulder External Rotation with Doorway  - 1 x daily - 7 x weekly - 2 sets - 10 reps - 5 seconds hold - Standing Isometric Shoulder Flexion with Doorway - Arm Bent  - 1 x daily - 7 x weekly - 2 sets - 10 reps - 5 seconds hold.  Access Code: [email or web address] URL: [email or web address] Date: 01/19/2024 Prepared by: Hazeline Lister  Exercises - Shoulder W - External Rotation with Resistance  - 1 x daily - 3-4 x weekly - 3 sets - 10 reps - Shoulder extension with resistance - Neutral  - 1 x daily - 3-4 x weekly - 3 sets - 10 reps - Doorway Pec Stretch at 60 Elevation  - 1 x daily - 3-4 x weekly - 3 sets - 10 reps - Squatting High Shoulder Row with Resistance  - 1 x daily - 3-4 x weekly - 3 sets - 10 reps - Standing Tricep Extensions with Resistance  - 1 x daily - 3-4 x weekly - 3 sets - 10 reps - Standing Upright Shoulder Row with Resistance  - 1  x daily - 3-4 x weekly - 3 sets - 10 reps - Standing Single Arm Bicep Curls Supinated with Dumbbell  - 1 x daily - 3-4 x weekly - 3 sets - 10 reps  ASSESSMENT:  CLINICAL IMPRESSION:    PT tx. session focused on L shoulder ROM, stability and pain mgmt.  Pt. Has good L/R scapular movement with overhead/ functional reaching.  No L UT compensatory movement and pt. Benefits from mirror for visual feedback of posture.  Pt. Unable to complete work-related tasks at this time due to lifting requirement, esp. With overhead tasks.  Pt. Will continue with more independent HEP and gym based/ aquatic ex.  Pt will benefit from continued PT services 1x/week with combined gym based there.ex. to increase L shoulder ROM/ stability to improve pain-free mobility.    OBJECTIVE IMPAIRMENTS: decreased ROM, decreased strength, hypomobility, impaired UE functional use, and pain.    ACTIVITY LIMITATIONS: carrying, bathing, dressing, reach over head, and hygiene/grooming  PARTICIPATION LIMITATIONS: meal prep, cleaning, laundry, driving, community activity, occupation, and yard work  PERSONAL FACTORS: Age and 1 comorbidity: adhesive capsulitis are also affecting patient's functional outcome.   REHAB POTENTIAL: Good  CLINICAL DECISION MAKING: Stable/uncomplicated  EVALUATION COMPLEXITY: Low  GOALS:  LONG TERM GOALS: Target date: 06/28/2024  Pt able to safely lift 50# box from waist to eye level with no sh. Pain or limitations to promote return to work.  Baseline: 30# box lift/ carry with good technique.   Goal status: Not met  2.  Pt will improve L shoulder MMT scores to at least 4/5 to demonstrate improvement in L shoulder strength. Baseline: See above Goal status: Partially met  3.  Pt will report <2/10 pain on NPS scale in order to show clinically significant improvement in pain at rest and with mobility. Baseline: increase shoulder discomfort with overhead lifting Goal status: Partially met  PLAN:  PT FREQUENCY: 1x/week  PT DURATION: 12 weeks  PLANNED INTERVENTIONS: 97164- PT Re-evaluation, 97110-Therapeutic exercises, 97530- Therapeutic activity, 97112- Neuromuscular re-education, 97140- Manual therapy, Joint mobilization, Cryotherapy, and Moist heat  PLAN FOR NEXT SESSION:  Progress UE strengthening/ functional reaching.  Work-related tasks  Lendell Quarry, PT, DPT # [phone] Physical Therapist - [facility]  [employer] 12:54 PM,05/02/24

## 2024-05-09 ENCOUNTER — Ambulatory Visit: Admitting: Physical Therapy

## 2024-05-09 ENCOUNTER — Encounter: Payer: Self-pay | Admitting: Physical Therapy

## 2024-05-09 DIAGNOSIS — M25612 Stiffness of left shoulder, not elsewhere classified: Secondary | ICD-10-CM

## 2024-05-09 DIAGNOSIS — G8929 Other chronic pain: Secondary | ICD-10-CM

## 2024-05-09 DIAGNOSIS — Z9889 Other specified postprocedural states: Secondary | ICD-10-CM

## 2024-05-09 NOTE — Therapy (Signed)
 OUTPATIENT PHYSICAL THERAPY SHOULDER TREATMENT  Patient Name: Hannah Cross MRN: [id number] DOB:[birth year], 68 y.o., female Today's Date: 05/09/2024  END OF SESSION:  PT End of Session - 05/09/24 1022     Visit Number 35    Number of Visits 42    Date for PT Re-Evaluation 06/28/24    PT Start Time 1027    PT Stop Time 1114    PT Time Calculation (min) 47 min            Past Surgical History:  Procedure Laterality Date   ABDOMINAL HYSTERECTOMY     OOPHORECTOMY     Patient Active Problem List   Diagnosis Date Noted   Syncope 08/06/2019    PCP: Claretta Croft, NP  REFERRING PROVIDER: Pam Bode, MD  REFERRING DIAG: Incomplete rotator cuff tear or rupture of left shoulder, not specified as traumatic; adhesive capsulitis of left shoulder  THERAPY DIAG:  S/P arthroscopy of left shoulder  Chronic left shoulder pain  Stiffness of left shoulder, not elsewhere classified  S/P left rotator cuff repair  Rationale for Evaluation and Treatment: Rehabilitation  ONSET DATE: 10/13/2023    SUBJECTIVE:                                                                                                                                                                                      SUBJECTIVE STATEMENT: Pt reports to PT s/p L shoulder arthroscopy with rotator cuff repair, extensive debridement, tenodesis of long tendon of biceps, decompression of subacromial space with partial acromioplasty and distal claviculectomy (10/13/2023). Pt reports dealing with chronic bilateral shoulder pain for most of this year and received injections in both shoulders (L side in July); pt reported improvement in pain with her R shoulder but no improvement with her left, which is what resulted in her having the surgery. She says her pain is usually worse at night and she is having trouble sleeping because of the pain. Pt reports all her sensation has returned since the numbing  block wore off earlier this week.  Hand dominance: Right  PERTINENT HISTORY: Adhesive capsulitis, stress fx of L fibula (September 2024)  PAIN:  Are you having pain? Yes: NPRS scale: 7/10 now, 9/10 worst, 2/10 best Pain location: L shoulder Pain description: Sharp, burning Aggravating factors: Movement, having shoulder at rest without support Relieving factors: Ibuprofen  PRECAUTIONS: Shoulder  RED FLAGS: None   WEIGHT BEARING RESTRICTIONS: Yes NWB LUE for first 6 weeks after surgery  FALLS:  Has patient fallen in last 6 months? No  LIVING ENVIRONMENT: Lives with: lives with their spouse Lives in: House/apartment Stairs: Yes: External: 3 steps; none Has  following equipment at home: None  OCCUPATION: Safety Specialist  PLOF: Independent  PATIENT GOALS:Get normal ROM back with no pain, get back to cooking for her family  NEXT MD VISIT: TBD  OBJECTIVE:  Note: Objective measures were completed at Evaluation unless otherwise noted.  DIAGNOSTIC FINDINGS:  N/A  PATIENT SURVEYS:  FOTO 41/63  COGNITION: Overall cognitive status: Within functional limits for tasks assessed     SENSATION: Not tested (pt reports sensation has returned since numbing block wore off)  POSTURE: No significant postural limitations noted  UPPER EXTREMITY ROM:   Passive/Active ROM Right Eval (Active)/In sitting Left eval  Shoulder flexion 166 90 (abiding by protocol)  Shoulder extension 75   Shoulder abduction 160   Shoulder adduction    Shoulder internal rotation [facility]   Shoulder external rotation WFL ~10 degrees (abiding by protocol)  Elbow flexion [facility] WFL  Elbow extension [facility] WFL  Wrist flexion    Wrist extension    Wrist ulnar deviation    Wrist radial deviation    Wrist pronation    Wrist supination    (Blank rows = not tested)  UPPER EXTREMITY MMT:  MMT Right eval Left eval  Shoulder flexion 5   Shoulder extension 5   Shoulder abduction 5   Shoulder adduction     Shoulder internal rotation 5   Shoulder external rotation 4   Middle trapezius    Lower trapezius    Elbow flexion    Elbow extension    Wrist flexion    Wrist extension    Wrist ulnar deviation    Wrist radial deviation    Wrist pronation    Wrist supination    Grip strength (lbs) 36.9 lbs average   (Blank rows = not tested)  12/17: Grip strength: R 51#, L 32#.     UPPER EXTREMITY ROM: Reassessment of B shoulder AROM   Active ROM Right  (Active)/In sitting Left   Shoulder flexion 168 deg. 145 deg.  Shoulder extension 65 deg. 45 deg.  Shoulder abduction 158 deg. 131 deg.   Shoulder adduction      Shoulder internal rotation 82 deg. 58 deg.   Shoulder external rotation 75 deg. 64 deg.  Elbow flexion [facility] WFL  Elbow extension [facility] WFL  Wrist flexion      Wrist extension      Wrist ulnar deviation      Wrist radial deviation      Wrist pronation      Wrist supination      (Blank rows = not tested)   UPPER EXTREMITY MMT: Reassessment of B shoulder MMT   MMT Right eval Left eval  Shoulder flexion 4-  4  Shoulder extension 4-  3  Shoulder abduction 4  3+  Shoulder adduction      Shoulder internal rotation 5  4  Shoulder external rotation 4-  3+  Middle trapezius  3  4  Lower trapezius      Elbow flexion      Elbow extension      Wrist flexion      Wrist extension      Wrist ulnar deviation      Wrist radial deviation      Wrist pronation      Wrist supination      Grip strength (lbs) 59.8#  44.6#  (Blank rows = not tested)  Seated L shoulder flexion (141 deg.), abduction (136).  Good scapular motion with shoulder abduction.  Supine L shoulder IR (  60 deg.), ER (46 deg.)- tight/ guarded today.  Increase L shoulder ER AROM to 62 deg.    Grip strength: R 61.8#, L 46.1#  TODAY'S TREATMENT:                                                                                                                                         DATE: 05/09/2024    Subjective:  Pt.  Reports 1-2/10 L sh. pain and soreness today.  Pt. Continues to be compliant with gym based ex.  Pt. Will be out of work for next 2 months per MD order and will focus on strengthening/ pain mgmt.    There.ex.:   B UBE 3 min. F/b.   Warm-up.  Discussed weekend activities.    B shoulder flexion with green ball at wall.  Warm-up.    Nautilus (handles): seated lat. Pull down 60# 20x2/ standing sh. Adduction 30# 20x (challenging/ neck discomfort)/ standing tricep extension 40# 10x/30# 10x (fatigue)/ standing scap. Retraction 40# 20x2/ bicep 20# 20x2.    Reassessment of shoulder AROM in seated/standing position.    No change to HEP.  Pt. Instructed to rest sh. Over next 2 days and return to UE ex. At gym On Saturday.     NOT TODAY:  Reassessment of L shoulder overhead reaching in sitting.  Standing Blaze Pods at mirror (varying positions)- 1 min. X 2.     Standing:  B sh. Extension/ ER/ IR/ chest press/ sh. Flexion/ abduction 20x (mirror feedback).    Standing Bodyblade: flexion/ IR and ER/ behind back 2x each with mirror feedback.    Standing wt. Wand (added 2# ankle wt): sh. Flexion/ chest press 10x each.  Pt. Challenged and benefits from mirror feedback for technique.    PATIENT EDUCATION: Education details: Role of PT, PT plan of care, icing/ HEP with GTB.   Person educated: Patient Education method: Medical illustrator Education comprehension: verbalized understanding  HOME EXERCISE PROGRAM: Access Code: [email or web address] URL: [email or web address] Date: 10/26/2023 Prepared by: Hazeline Lister Exercises - Seated Scapular Retraction - 2 x daily - 7 x weekly - 1 sets - 10 reps - Seated Cervical Rotation AROM - 2 x daily - 7 x weekly - 1 sets - 10 reps - Seated Cervical Sidebending AROM - 2 x daily - 7 x weekly - 1 sets - 10 reps - Standing Circular Shoulder Pendulum Supported with Arm Bent - 2 x daily - 7 x weekly - 1 sets - 10 reps - Seated Elbow Flexion AAROM - 2 x daily -  7 x weekly - 1 sets - 10 reps - Seated AAROM Elbow Supination/Pronation with Clasped Hands - 2 x daily - 7 x weekly - 1 sets - 10 reps  Access Code: [email or web address] URL: [email or web address] Date: 10/28/2023 Prepared by: Hazeline Lister  Exercises - Seated Scapular Retraction  - 2 x daily - 7 x weekly - 1  sets - 10 reps - Seated Cervical Rotation AROM  - 2 x daily - 7 x weekly - 1 sets - 10 reps - Seated Cervical Sidebending AROM  - 2 x daily - 7 x weekly - 1 sets - 10 reps - Standing Circular Shoulder Pendulum Supported with Arm Bent  - 2 x daily - 7 x weekly - 1 sets - 10 reps - Seated Elbow Flexion AAROM  - 2 x daily - 7 x weekly - 1 sets - 10 reps - Seated Shoulder Flexion Self PROM  - 3 x daily - 7 x weekly - 1 sets - 10 reps - 5-10 second hold - Supine Shoulder External Rotation with Dowel  - 3 x daily - 7 x weekly - 1 sets - 10 reps - 5-10 seconds hold  Access Code: [email or web address] URL: [email or web address] Date: 11/16/2023 Prepared by: Hazeline Lister  Exercises - Seated Cervical Sidebending AROM  - 2 x daily - 7 x weekly - 1 sets - 10 reps - Circular Shoulder Pendulum with Table Support  - 2 x daily - 7 x weekly - 1 sets - 10 reps - Seated Shoulder Flexion AAROM with Pulley Behind  - 2 x daily - 7 x weekly - 1 sets - 20 reps - Seated Shoulder Scaption AAROM with Pulley at Side  - 2 x daily - 7 x weekly - 1 sets - 20 reps - Seated Shoulder Abduction AAROM with Pulley Behind  - 2 x daily - 7 x weekly - 1 sets - 20 reps - Supine Shoulder External Rotation in 45 Degrees Abduction AAROM with Dowel  - 2 x daily - 7 x weekly - 1 sets - 10 reps  Access Code: [email or web address] URL: [email or web address] Date: 12/12/2023 Prepared by: Hazeline Lister  Exercises - Seated Shoulder Flexion AAROM with Pulley Behind  - 2 x daily - 7 x weekly - 1 sets - 20 reps - Seated Shoulder Scaption AAROM with Pulley at Side  - 2 x daily - 7 x weekly - 1 sets - 20 reps - Seated  Shoulder Abduction AAROM with Pulley Behind  - 2 x daily - 7 x weekly - 1 sets - 20 reps - Standing Shoulder Flexion Wall Walk  - 2 x daily - 7 x weekly - 2 sets - 10 reps - Sidelying Shoulder External Rotation  - 2 x daily - 7 x weekly - 2 sets - 10 reps - Supine Pectoralis Stretch  - 1 x daily - 7 x weekly - 1 sets - 10 reps - Standing Isometric Shoulder External Rotation with Doorway  - 1 x daily - 7 x weekly - 2 sets - 10 reps - 5 seconds hold - Standing Isometric Shoulder Flexion with Doorway - Arm Bent  - 1 x daily - 7 x weekly - 2 sets - 10 reps - 5 seconds hold.  Access Code: [email or web address] URL: [email or web address] Date: 01/19/2024 Prepared by: Hazeline Lister  Exercises - Shoulder W - External Rotation with Resistance  - 1 x daily - 3-4 x weekly - 3 sets - 10 reps - Shoulder extension with resistance - Neutral  - 1 x daily - 3-4 x weekly - 3 sets - 10 reps - Doorway Pec Stretch at 60 Elevation  - 1 x daily - 3-4 x weekly - 3 sets - 10 reps - Squatting High Shoulder Row with Resistance  - 1 x daily - 3-4 x weekly - 3 sets - 10 reps - Standing Tricep Extensions  with Resistance  - 1 x daily - 3-4 x weekly - 3 sets - 10 reps - Standing Upright Shoulder Row with Resistance  - 1 x daily - 3-4 x weekly - 3 sets - 10 reps - Standing Single Arm Bicep Curls Supinated with Dumbbell  - 1 x daily - 3-4 x weekly - 3 sets - 10 reps  ASSESSMENT:  CLINICAL IMPRESSION:    PT tx. session focused on L shoulder ROM, stability and pain mgmt.  Pt. Challenged with B shoulder adduction/ tricep extension ex. At [employer].  L posterior shoulder tenderness with palpation.  L shoulder flexion WFL with overhead reaching tasks.   Pt. Unable to complete work-related tasks at this time due to lifting requirement, esp. With overhead tasks.  Pt. Will continue with more independent HEP and gym based/ aquatic ex.  Pt will benefit from continued PT services 1x/week with combined gym based there.ex. to increase L  shoulder ROM/ stability to improve pain-free mobility.    OBJECTIVE IMPAIRMENTS: decreased ROM, decreased strength, hypomobility, impaired UE functional use, and pain.   ACTIVITY LIMITATIONS: carrying, bathing, dressing, reach over head, and hygiene/grooming  PARTICIPATION LIMITATIONS: meal prep, cleaning, laundry, driving, community activity, occupation, and yard work  PERSONAL FACTORS: Age and 1 comorbidity: adhesive capsulitis are also affecting patient's functional outcome.   REHAB POTENTIAL: Good  CLINICAL DECISION MAKING: Stable/uncomplicated  EVALUATION COMPLEXITY: Low  GOALS:  LONG TERM GOALS: Target date: 06/28/2024  Pt able to safely lift 50# box from waist to eye level with no sh. Pain or limitations to promote return to work.  Baseline: 30# box lift/ carry with good technique.   Goal status: Not met  2.  Pt will improve L shoulder MMT scores to at least 4/5 to demonstrate improvement in L shoulder strength. Baseline: See above Goal status: Partially met  3.  Pt will report <2/10 pain on NPS scale in order to show clinically significant improvement in pain at rest and with mobility. Baseline: increase shoulder discomfort with overhead lifting Goal status: Partially met  PLAN:  PT FREQUENCY: 1x/week  PT DURATION: 12 weeks  PLANNED INTERVENTIONS: 97164- PT Re-evaluation, 97110-Therapeutic exercises, 97530- Therapeutic activity, 97112- Neuromuscular re-education, 97140- Manual therapy, Joint mobilization, Cryotherapy, and Moist heat  PLAN FOR NEXT SESSION:  Progress UE strengthening/ functional reaching.  Work-related tasks  Lendell Quarry, PT, DPT # [phone] Physical Therapist -   [facility] 12:40 PM,05/09/24

## 2024-05-15 ENCOUNTER — Encounter: Payer: Self-pay | Admitting: Physical Therapy

## 2024-05-15 ENCOUNTER — Ambulatory Visit: Attending: Hand Surgery | Admitting: Physical Therapy

## 2024-05-15 DIAGNOSIS — Z9889 Other specified postprocedural states: Secondary | ICD-10-CM | POA: Insufficient documentation

## 2024-05-15 DIAGNOSIS — M25512 Pain in left shoulder: Secondary | ICD-10-CM | POA: Insufficient documentation

## 2024-05-15 DIAGNOSIS — M25612 Stiffness of left shoulder, not elsewhere classified: Secondary | ICD-10-CM | POA: Diagnosis present

## 2024-05-15 DIAGNOSIS — G8929 Other chronic pain: Secondary | ICD-10-CM | POA: Insufficient documentation

## 2024-05-15 NOTE — Therapy (Signed)
 OUTPATIENT PHYSICAL THERAPY SHOULDER TREATMENT  Patient Name: Shanisha Lech MRN: [id number] DOB:[birth year], 68 y.o., female Today's Date: 05/15/2024  END OF SESSION:  PT End of Session - 05/15/24 0855     Visit Number 36    Number of Visits 42    Date for PT Re-Evaluation 06/28/24    PT Start Time 0855    PT Stop Time 0941    PT Time Calculation (min) 46 min            Past Surgical History:  Procedure Laterality Date   ABDOMINAL HYSTERECTOMY     OOPHORECTOMY     Patient Active Problem List   Diagnosis Date Noted   Syncope 08/06/2019    PCP: Claretta Croft, NP  REFERRING PROVIDER: Pam Bode, MD  REFERRING DIAG: Incomplete rotator cuff tear or rupture of left shoulder, not specified as traumatic; adhesive capsulitis of left shoulder  THERAPY DIAG:  S/P arthroscopy of left shoulder  Chronic left shoulder pain  Stiffness of left shoulder, not elsewhere classified  S/P left rotator cuff repair  Rationale for Evaluation and Treatment: Rehabilitation  ONSET DATE: 10/13/2023    SUBJECTIVE:                                                                                                                                                                                      SUBJECTIVE STATEMENT: Pt reports to PT s/p L shoulder arthroscopy with rotator cuff repair, extensive debridement, tenodesis of long tendon of biceps, decompression of subacromial space with partial acromioplasty and distal claviculectomy (10/13/2023). Pt reports dealing with chronic bilateral shoulder pain for most of this year and received injections in both shoulders (L side in July); pt reported improvement in pain with her R shoulder but no improvement with her left, which is what resulted in her having the surgery. She says her pain is usually worse at night and she is having trouble sleeping because of the pain. Pt reports all her sensation has returned since the numbing  block wore off earlier this week.  Hand dominance: Right  PERTINENT HISTORY: Adhesive capsulitis, stress fx of L fibula (September 2024)  PAIN:  Are you having pain? Yes: NPRS scale: 7/10 now, 9/10 worst, 2/10 best Pain location: L shoulder Pain description: Sharp, burning Aggravating factors: Movement, having shoulder at rest without support Relieving factors: Ibuprofen  PRECAUTIONS: Shoulder  RED FLAGS: None   WEIGHT BEARING RESTRICTIONS: Yes NWB LUE for first 6 weeks after surgery  FALLS:  Has patient fallen in last 6 months? No  LIVING ENVIRONMENT: Lives with: lives with their spouse Lives in: House/apartment Stairs: Yes: External: 3 steps; none Has  following equipment at home: None  OCCUPATION: Safety Specialist  PLOF: Independent  PATIENT GOALS:Get normal ROM back with no pain, get back to cooking for her family  NEXT MD VISIT: TBD  OBJECTIVE:  Note: Objective measures were completed at Evaluation unless otherwise noted.  DIAGNOSTIC FINDINGS:  N/A  PATIENT SURVEYS:  FOTO 41/63  COGNITION: Overall cognitive status: Within functional limits for tasks assessed     SENSATION: Not tested (pt reports sensation has returned since numbing block wore off)  POSTURE: No significant postural limitations noted  UPPER EXTREMITY ROM:   Passive/Active ROM Right Eval (Active)/In sitting Left eval  Shoulder flexion 166 90 (abiding by protocol)  Shoulder extension 75   Shoulder abduction 160   Shoulder adduction    Shoulder internal rotation [facility]   Shoulder external rotation WFL ~10 degrees (abiding by protocol)  Elbow flexion [facility] WFL  Elbow extension [facility] WFL  Wrist flexion    Wrist extension    Wrist ulnar deviation    Wrist radial deviation    Wrist pronation    Wrist supination    (Blank rows = not tested)  UPPER EXTREMITY MMT:  MMT Right eval Left eval  Shoulder flexion 5   Shoulder extension 5   Shoulder abduction 5   Shoulder adduction     Shoulder internal rotation 5   Shoulder external rotation 4   Middle trapezius    Lower trapezius    Elbow flexion    Elbow extension    Wrist flexion    Wrist extension    Wrist ulnar deviation    Wrist radial deviation    Wrist pronation    Wrist supination    Grip strength (lbs) 36.9 lbs average   (Blank rows = not tested)  12/17: Grip strength: R 51#, L 32#.     UPPER EXTREMITY ROM: Reassessment of B shoulder AROM   Active ROM Right  (Active)/In sitting Left   Shoulder flexion 168 deg. 145 deg.  Shoulder extension 65 deg. 45 deg.  Shoulder abduction 158 deg. 131 deg.   Shoulder adduction      Shoulder internal rotation 82 deg. 58 deg.   Shoulder external rotation 75 deg. 64 deg.  Elbow flexion [facility] WFL  Elbow extension [facility] WFL  Wrist flexion      Wrist extension      Wrist ulnar deviation      Wrist radial deviation      Wrist pronation      Wrist supination      (Blank rows = not tested)   UPPER EXTREMITY MMT: Reassessment of B shoulder MMT   MMT Right eval Left eval  Shoulder flexion 4-  4  Shoulder extension 4-  3  Shoulder abduction 4  3+  Shoulder adduction      Shoulder internal rotation 5  4  Shoulder external rotation 4-  3+  Middle trapezius  3  4  Lower trapezius      Elbow flexion      Elbow extension      Wrist flexion      Wrist extension      Wrist ulnar deviation      Wrist radial deviation      Wrist pronation      Wrist supination      Grip strength (lbs) 59.8#  44.6#  (Blank rows = not tested)  Seated L shoulder flexion (141 deg.), abduction (136).  Good scapular motion with shoulder abduction.  Supine L shoulder IR (  60 deg.), ER (46 deg.)- tight/ guarded today.  Increase L shoulder ER AROM to 62 deg.    Grip strength: R 61.8#, L 46.1#  TODAY'S TREATMENT:                                                                                                                                         DATE: 05/15/2024    Subjective:  Pt.  Reports 3-4/10 L sh. pain and soreness today.  Pt. Continues to be compliant with gym based ex.  Pt. Will be out of work for next 2 months per MD order and will focus on strengthening/ pain mgmt.    There.ex.:   B UBE 3 min. F/b.   Warm-up.  Discussed gym based ex./ technique.     2# dumbbell ex.: standing diagonals flexion/ extension/ shoulder ER 10x each on L/R.    B shoulder flexion with white ball at wall.  Focus on sh. Flexion stretches/ stat holds.     Nautilus (wand): seated lat. Pull down 70# 20x (challenged with increase wt.)/ standing sh. Crossbody 20# 20x/ standing tricep extension 30# 20x/ standing scap. Retraction 40# 20x2/ bicep 20# 20x.    Reassessment of shoulder AROM in seated/standing position.    STM to L UT/posterior and anterior deltoid musculature in seated posture after tx.     NOT TODAY:  Reassessment of L shoulder overhead reaching in sitting.  Standing Blaze Pods at mirror (varying positions)- 1 min. X 2.     Standing:  B sh. Extension/ ER/ IR/ chest press/ sh. Flexion/ abduction 20x (mirror feedback).    Standing Bodyblade: flexion/ IR and ER/ behind back 2x each with mirror feedback.    Standing wt. Wand (added 2# ankle wt): sh. Flexion/ chest press 10x each.  Pt. Challenged and benefits from mirror feedback for technique.    PATIENT EDUCATION: Education details: Role of PT, PT plan of care, icing/ HEP with GTB.   Person educated: Patient Education method: Medical illustrator Education comprehension: verbalized understanding  HOME EXERCISE PROGRAM: Access Code: [email or web address] URL: [email or web address] Date: 10/26/2023 Prepared by: Hazeline Lister Exercises - Seated Scapular Retraction - 2 x daily - 7 x weekly - 1 sets - 10 reps - Seated Cervical Rotation AROM - 2 x daily - 7 x weekly - 1 sets - 10 reps - Seated Cervical Sidebending AROM - 2 x daily - 7 x weekly - 1 sets - 10 reps - Standing Circular Shoulder Pendulum Supported with Arm  Bent - 2 x daily - 7 x weekly - 1 sets - 10 reps - Seated Elbow Flexion AAROM - 2 x daily - 7 x weekly - 1 sets - 10 reps - Seated AAROM Elbow Supination/Pronation with Clasped Hands - 2 x daily - 7 x weekly - 1 sets - 10 reps  Access Code: [email or web address] URL: [email or web address] Date: 10/28/2023 Prepared by: Hazeline Lister  Exercises -  Seated Scapular Retraction  - 2 x daily - 7 x weekly - 1 sets - 10 reps - Seated Cervical Rotation AROM  - 2 x daily - 7 x weekly - 1 sets - 10 reps - Seated Cervical Sidebending AROM  - 2 x daily - 7 x weekly - 1 sets - 10 reps - Standing Circular Shoulder Pendulum Supported with Arm Bent  - 2 x daily - 7 x weekly - 1 sets - 10 reps - Seated Elbow Flexion AAROM  - 2 x daily - 7 x weekly - 1 sets - 10 reps - Seated Shoulder Flexion Self PROM  - 3 x daily - 7 x weekly - 1 sets - 10 reps - 5-10 second hold - Supine Shoulder External Rotation with Dowel  - 3 x daily - 7 x weekly - 1 sets - 10 reps - 5-10 seconds hold  Access Code: [email or web address] URL: [email or web address] Date: 11/16/2023 Prepared by: Hazeline Lister  Exercises - Seated Cervical Sidebending AROM  - 2 x daily - 7 x weekly - 1 sets - 10 reps - Circular Shoulder Pendulum with Table Support  - 2 x daily - 7 x weekly - 1 sets - 10 reps - Seated Shoulder Flexion AAROM with Pulley Behind  - 2 x daily - 7 x weekly - 1 sets - 20 reps - Seated Shoulder Scaption AAROM with Pulley at Side  - 2 x daily - 7 x weekly - 1 sets - 20 reps - Seated Shoulder Abduction AAROM with Pulley Behind  - 2 x daily - 7 x weekly - 1 sets - 20 reps - Supine Shoulder External Rotation in 45 Degrees Abduction AAROM with Dowel  - 2 x daily - 7 x weekly - 1 sets - 10 reps  Access Code: [email or web address] URL: [email or web address] Date: 12/12/2023 Prepared by: Hazeline Lister  Exercises - Seated Shoulder Flexion AAROM with Pulley Behind  - 2 x daily - 7 x weekly - 1 sets - 20 reps - Seated Shoulder  Scaption AAROM with Pulley at Side  - 2 x daily - 7 x weekly - 1 sets - 20 reps - Seated Shoulder Abduction AAROM with Pulley Behind  - 2 x daily - 7 x weekly - 1 sets - 20 reps - Standing Shoulder Flexion Wall Walk  - 2 x daily - 7 x weekly - 2 sets - 10 reps - Sidelying Shoulder External Rotation  - 2 x daily - 7 x weekly - 2 sets - 10 reps - Supine Pectoralis Stretch  - 1 x daily - 7 x weekly - 1 sets - 10 reps - Standing Isometric Shoulder External Rotation with Doorway  - 1 x daily - 7 x weekly - 2 sets - 10 reps - 5 seconds hold - Standing Isometric Shoulder Flexion with Doorway - Arm Bent  - 1 x daily - 7 x weekly - 2 sets - 10 reps - 5 seconds hold.  Access Code: [email or web address] URL: [email or web address] Date: 01/19/2024 Prepared by: Hazeline Lister  Exercises - Shoulder W - External Rotation with Resistance  - 1 x daily - 3-4 x weekly - 3 sets - 10 reps - Shoulder extension with resistance - Neutral  - 1 x daily - 3-4 x weekly - 3 sets - 10 reps - Doorway Pec Stretch at 60 Elevation  - 1 x daily - 3-4 x weekly - 3 sets - 10 reps - Squatting High Shoulder Row with Resistance  - 1 x daily -  3-4 x weekly - 3 sets - 10 reps - Standing Tricep Extensions with Resistance  - 1 x daily - 3-4 x weekly - 3 sets - 10 reps - Standing Upright Shoulder Row with Resistance  - 1 x daily - 3-4 x weekly - 3 sets - 10 reps - Standing Single Arm Bicep Curls Supinated with Dumbbell  - 1 x daily - 3-4 x weekly - 3 sets - 10 reps  ASSESSMENT:  CLINICAL IMPRESSION:    PT tx. session focused on L shoulder ROM, stability and pain mgmt.  Pt. Challenged with increase resistance during lat. Pull downs at [employer].  L posterior shoulder tenderness with palpation.  L shoulder flexion WFL with overhead reaching tasks.   Pt. Unable to complete work-related tasks at this time due to lifting requirement, esp. With overhead tasks.  Pt. Will continue with more independent HEP and gym based/ aquatic ex.  Pt will  benefit from continued PT services 1x/week with combined gym based there.ex. to increase L shoulder ROM/ stability to improve pain-free mobility.    OBJECTIVE IMPAIRMENTS: decreased ROM, decreased strength, hypomobility, impaired UE functional use, and pain.   ACTIVITY LIMITATIONS: carrying, bathing, dressing, reach over head, and hygiene/grooming  PARTICIPATION LIMITATIONS: meal prep, cleaning, laundry, driving, community activity, occupation, and yard work  PERSONAL FACTORS: Age and 1 comorbidity: adhesive capsulitis are also affecting patient's functional outcome.   REHAB POTENTIAL: Good  CLINICAL DECISION MAKING: Stable/uncomplicated  EVALUATION COMPLEXITY: Low  GOALS:  LONG TERM GOALS: Target date: 06/28/2024  Pt able to safely lift 50# box from waist to eye level with no sh. Pain or limitations to promote return to work.  Baseline: 30# box lift/ carry with good technique.   Goal status: Not met  2.  Pt will improve L shoulder MMT scores to at least 4/5 to demonstrate improvement in L shoulder strength. Baseline: See above Goal status: Partially met  3.  Pt will report <2/10 pain on NPS scale in order to show clinically significant improvement in pain at rest and with mobility. Baseline: increase shoulder discomfort with overhead lifting Goal status: Partially met  PLAN:  PT FREQUENCY: 1x/week  PT DURATION: 12 weeks  PLANNED INTERVENTIONS: 97164- PT Re-evaluation, 97110-Therapeutic exercises, 97530- Therapeutic activity, 97112- Neuromuscular re-education, 97140- Manual therapy, Joint mobilization, Cryotherapy, and Moist heat  PLAN FOR NEXT SESSION:  Progress UE strengthening/ functional reaching.  Work-related tasks  Lendell Quarry, PT, DPT # [phone] Physical Therapist - [place]  [facility] 9:43 AM,05/15/24

## 2024-05-23 ENCOUNTER — Ambulatory Visit: Admitting: Physical Therapy

## 2024-05-23 ENCOUNTER — Encounter: Payer: Self-pay | Admitting: Physical Therapy

## 2024-05-23 DIAGNOSIS — M25612 Stiffness of left shoulder, not elsewhere classified: Secondary | ICD-10-CM

## 2024-05-23 DIAGNOSIS — G8929 Other chronic pain: Secondary | ICD-10-CM

## 2024-05-23 DIAGNOSIS — Z9889 Other specified postprocedural states: Secondary | ICD-10-CM | POA: Diagnosis not present

## 2024-05-23 NOTE — Therapy (Signed)
 OUTPATIENT PHYSICAL THERAPY SHOULDER TREATMENT  Patient Name: Hannah Cross MRN: [id number] DOB:15-Nov-1956, 68 y.o., female Today's Date: 05/23/2024  END OF SESSION:  PT End of Session - 05/23/24 0905     Visit Number 37    Number of Visits 42    Date for PT Re-Evaluation 06/28/24    PT Start Time 0905    PT Stop Time 0947    PT Time Calculation (min) 42 min            Past Surgical History:  Procedure Laterality Date   ABDOMINAL HYSTERECTOMY     OOPHORECTOMY     Patient Active Problem List   Diagnosis Date Noted   Syncope 08/06/2019    PCP: Claretta Croft, NP  REFERRING PROVIDER: Pam Bode, MD  REFERRING DIAG: Incomplete rotator cuff tear or rupture of left shoulder, not specified as traumatic; adhesive capsulitis of left shoulder  THERAPY DIAG:  S/P arthroscopy of left shoulder  Chronic left shoulder pain  Stiffness of left shoulder, not elsewhere classified  S/P left rotator cuff repair  Rationale for Evaluation and Treatment: Rehabilitation  ONSET DATE: 10/13/2023    SUBJECTIVE:                                                                                                                                                                                      SUBJECTIVE STATEMENT: Pt reports to PT s/p L shoulder arthroscopy with rotator cuff repair, extensive debridement, tenodesis of long tendon of biceps, decompression of subacromial space with partial acromioplasty and distal claviculectomy (10/13/2023). Pt reports dealing with chronic bilateral shoulder pain for most of this year and received injections in both shoulders (L side in July); pt reported improvement in pain with her R shoulder but no improvement with her left, which is what resulted in her having the surgery. She says her pain is usually worse at night and she is having trouble sleeping because of the pain. Pt reports all her sensation has returned since the numbing  block wore off earlier this week.  Hand dominance: Right  PERTINENT HISTORY: Adhesive capsulitis, stress fx of L fibula (September 2024)  PAIN:  Are you having pain? Yes: NPRS scale: 7/10 now, 9/10 worst, 2/10 best Pain location: L shoulder Pain description: Sharp, burning Aggravating factors: Movement, having shoulder at rest without support Relieving factors: Ibuprofen  PRECAUTIONS: Shoulder  RED FLAGS: None   WEIGHT BEARING RESTRICTIONS: Yes NWB LUE for first 6 weeks after surgery  FALLS:  Has patient fallen in last 6 months? No  LIVING ENVIRONMENT: Lives with: lives with their spouse Lives in: House/apartment Stairs: Yes: External: 3 steps; none Has  following equipment at home: None  OCCUPATION: Safety Specialist  PLOF: Independent  PATIENT GOALS:Get normal ROM back with no pain, get back to cooking for her family  NEXT MD VISIT: TBD  OBJECTIVE:  Note: Objective measures were completed at Evaluation unless otherwise noted.  DIAGNOSTIC FINDINGS:  N/A  PATIENT SURVEYS:  FOTO 41/63  COGNITION: Overall cognitive status: Within functional limits for tasks assessed     SENSATION: Not tested (pt reports sensation has returned since numbing block wore off)  POSTURE: No significant postural limitations noted  UPPER EXTREMITY ROM:   Passive/Active ROM Right Eval (Active)/In sitting Left eval  Shoulder flexion 166 90 (abiding by protocol)  Shoulder extension 75   Shoulder abduction 160   Shoulder adduction    Shoulder internal rotation [facility]   Shoulder external rotation WFL ~10 degrees (abiding by protocol)  Elbow flexion [facility] WFL  Elbow extension [facility] WFL  Wrist flexion    Wrist extension    Wrist ulnar deviation    Wrist radial deviation    Wrist pronation    Wrist supination    (Blank rows = not tested)  UPPER EXTREMITY MMT:  MMT Right eval Left eval  Shoulder flexion 5   Shoulder extension 5   Shoulder abduction 5   Shoulder adduction     Shoulder internal rotation 5   Shoulder external rotation 4   Middle trapezius    Lower trapezius    Elbow flexion    Elbow extension    Wrist flexion    Wrist extension    Wrist ulnar deviation    Wrist radial deviation    Wrist pronation    Wrist supination    Grip strength (lbs) 36.9 lbs average   (Blank rows = not tested)  12/17: Grip strength: R 51#, L 32#.     UPPER EXTREMITY ROM: Reassessment of B shoulder AROM   Active ROM Right  (Active)/In sitting Left   Shoulder flexion 168 deg. 145 deg.  Shoulder extension 65 deg. 45 deg.  Shoulder abduction 158 deg. 131 deg.   Shoulder adduction      Shoulder internal rotation 82 deg. 58 deg.   Shoulder external rotation 75 deg. 64 deg.  Elbow flexion [facility] WFL  Elbow extension [facility] WFL  Wrist flexion      Wrist extension      Wrist ulnar deviation      Wrist radial deviation      Wrist pronation      Wrist supination      (Blank rows = not tested)   UPPER EXTREMITY MMT: Reassessment of B shoulder MMT   MMT Right eval Left eval  Shoulder flexion 4-  4  Shoulder extension 4-  3  Shoulder abduction 4  3+  Shoulder adduction      Shoulder internal rotation 5  4  Shoulder external rotation 4-  3+  Middle trapezius  3  4  Lower trapezius      Elbow flexion      Elbow extension      Wrist flexion      Wrist extension      Wrist ulnar deviation      Wrist radial deviation      Wrist pronation      Wrist supination      Grip strength (lbs) 59.8#  44.6#  (Blank rows = not tested)  Seated L shoulder flexion (141 deg.), abduction (136).  Good scapular motion with shoulder abduction.  Supine L shoulder IR (  60 deg.), ER (46 deg.)- tight/ guarded today.  Increase L shoulder ER AROM to 62 deg.    Grip strength: R 61.8#, L 46.1#  TODAY'S TREATMENT:                                                                                                                                         DATE: 05/23/2024    Subjective:  Pt.  Reports 1-2/10 L sh. pain and soreness today.  Pt. Continues to be compliant with gym based ex.  Pt reports discomfort with straight arm movements and doorway pec strech. Pt. Will be out of work for next 2 months per MD order and will focus on strengthening/ pain mgmt.    There.ex.:   B UBE 3 min. F/b.   Warm-up.  Discussed gym based ex./ technique.      B shoulder flexion with green ball at wall.  Focus on sh. Flexion stretches/ static holds.    Reassessment of shoulder AROM in supine/ standing position.   Grade 2-3 L shoulder mobs distraction, inferior, anterior   Nautilus (handles): standing lat. Pull down 60# 20x/ standing tricep extension 30# 20x/ standing scap. Retraction 40# 15x2/ bicep 20# 20x. / standing external rotation B, 20# x20 on R, 12x on L   Discussion of return to work goals/ home activities    STM to L UT/posterior and anterior deltoid musculature in seated posture after tx.   Standing L shoulder flexion with touches at varying position 10x.  Marked L shoulder fatigue noted.     NOT TODAY:  Standing Bodyblade: flexion/ IR and ER/ behind back 2x each with mirror feedback.    Standing wt. Wand (added 2# ankle wt): sh. Flexion/ chest press 10x each.  Pt. Challenged and benefits from mirror feedback for technique.    PATIENT EDUCATION: Education details: Role of PT, PT plan of care, icing/ HEP with GTB.   Person educated: Patient Education method: Medical illustrator Education comprehension: verbalized understanding  HOME EXERCISE PROGRAM: Access Code: [email or web address] URL: [email or web address] Date: 10/26/2023 Prepared by: Hazeline Lister Exercises - Seated Scapular Retraction - 2 x daily - 7 x weekly - 1 sets - 10 reps - Seated Cervical Rotation AROM - 2 x daily - 7 x weekly - 1 sets - 10 reps - Seated Cervical Sidebending AROM - 2 x daily - 7 x weekly - 1 sets - 10 reps - Standing Circular Shoulder Pendulum Supported with Arm Bent - 2 x daily - 7 x  weekly - 1 sets - 10 reps - Seated Elbow Flexion AAROM - 2 x daily - 7 x weekly - 1 sets - 10 reps - Seated AAROM Elbow Supination/Pronation with Clasped Hands - 2 x daily - 7 x weekly - 1 sets - 10 reps  Access Code: [email or web address] URL: [email or web address] Date: 10/28/2023 Prepared by: Hazeline Lister  Exercises - Seated Scapular Retraction  -  2 x daily - 7 x weekly - 1 sets - 10 reps - Seated Cervical Rotation AROM  - 2 x daily - 7 x weekly - 1 sets - 10 reps - Seated Cervical Sidebending AROM  - 2 x daily - 7 x weekly - 1 sets - 10 reps - Standing Circular Shoulder Pendulum Supported with Arm Bent  - 2 x daily - 7 x weekly - 1 sets - 10 reps - Seated Elbow Flexion AAROM  - 2 x daily - 7 x weekly - 1 sets - 10 reps - Seated Shoulder Flexion Self PROM  - 3 x daily - 7 x weekly - 1 sets - 10 reps - 5-10 second hold - Supine Shoulder External Rotation with Dowel  - 3 x daily - 7 x weekly - 1 sets - 10 reps - 5-10 seconds hold  Access Code: [email or web address] URL: [email or web address] Date: 11/16/2023 Prepared by: Hazeline Lister  Exercises - Seated Cervical Sidebending AROM  - 2 x daily - 7 x weekly - 1 sets - 10 reps - Circular Shoulder Pendulum with Table Support  - 2 x daily - 7 x weekly - 1 sets - 10 reps - Seated Shoulder Flexion AAROM with Pulley Behind  - 2 x daily - 7 x weekly - 1 sets - 20 reps - Seated Shoulder Scaption AAROM with Pulley at Side  - 2 x daily - 7 x weekly - 1 sets - 20 reps - Seated Shoulder Abduction AAROM with Pulley Behind  - 2 x daily - 7 x weekly - 1 sets - 20 reps - Supine Shoulder External Rotation in 45 Degrees Abduction AAROM with Dowel  - 2 x daily - 7 x weekly - 1 sets - 10 reps  Access Code: [email or web address] URL: [email or web address] Date: 12/12/2023 Prepared by: Hazeline Lister  Exercises - Seated Shoulder Flexion AAROM with Pulley Behind  - 2 x daily - 7 x weekly - 1 sets - 20 reps - Seated Shoulder Scaption AAROM with Pulley  at Side  - 2 x daily - 7 x weekly - 1 sets - 20 reps - Seated Shoulder Abduction AAROM with Pulley Behind  - 2 x daily - 7 x weekly - 1 sets - 20 reps - Standing Shoulder Flexion Wall Walk  - 2 x daily - 7 x weekly - 2 sets - 10 reps - Sidelying Shoulder External Rotation  - 2 x daily - 7 x weekly - 2 sets - 10 reps - Supine Pectoralis Stretch  - 1 x daily - 7 x weekly - 1 sets - 10 reps - Standing Isometric Shoulder External Rotation with Doorway  - 1 x daily - 7 x weekly - 2 sets - 10 reps - 5 seconds hold - Standing Isometric Shoulder Flexion with Doorway - Arm Bent  - 1 x daily - 7 x weekly - 2 sets - 10 reps - 5 seconds hold.  Access Code: [email or web address] URL: [email or web address] Date: 01/19/2024 Prepared by: Hazeline Lister  Exercises - Shoulder W - External Rotation with Resistance  - 1 x daily - 3-4 x weekly - 3 sets - 10 reps - Shoulder extension with resistance - Neutral  - 1 x daily - 3-4 x weekly - 3 sets - 10 reps - Doorway Pec Stretch at 60 Elevation  - 1 x daily - 3-4 x weekly - 3 sets - 10 reps - Squatting High Shoulder Row with Resistance  - 1 x daily - 3-4 x weekly -  3 sets - 10 reps - Standing Tricep Extensions with Resistance  - 1 x daily - 3-4 x weekly - 3 sets - 10 reps - Standing Upright Shoulder Row with Resistance  - 1 x daily - 3-4 x weekly - 3 sets - 10 reps - Standing Single Arm Bicep Curls Supinated with Dumbbell  - 1 x daily - 3-4 x weekly - 3 sets - 10 reps  ASSESSMENT:  CLINICAL IMPRESSION:    PT tx. session focused on L shoulder ROM, stability and pain mgmt.  Pt. Unable to complete work-related tasks at this time due to lifting requirement, esp. With overhead tasks.  Pt. Will continue with more independent HEP and gym based/ aquatic ex.  Standing L shoulder flexion/ abduction to [facility] but pain limited.  Pt. Challenged with resisted Nautilus ex. And complete all reps with PT feedback to prevent compensatory movement patterns.   Pt will benefit from  continued PT services 1x/week with combined gym based there.ex. to increase L shoulder ROM/ stability to improve pain-free mobility.    OBJECTIVE IMPAIRMENTS: decreased ROM, decreased strength, hypomobility, impaired UE functional use, and pain.   ACTIVITY LIMITATIONS: carrying, bathing, dressing, reach over head, and hygiene/grooming  PARTICIPATION LIMITATIONS: meal prep, cleaning, laundry, driving, community activity, occupation, and yard work  PERSONAL FACTORS: Age and 1 comorbidity: adhesive capsulitis are also affecting patient's functional outcome.   REHAB POTENTIAL: Good  CLINICAL DECISION MAKING: Stable/uncomplicated  EVALUATION COMPLEXITY: Low  GOALS:  LONG TERM GOALS: Target date: 06/28/2024  Pt able to safely lift 50# box from waist to eye level with no sh. Pain or limitations to promote return to work.  Baseline: 30# box lift/ carry with good technique.   Goal status: Not met  2.  Pt will improve L shoulder MMT scores to at least 4/5 to demonstrate improvement in L shoulder strength. Baseline: See above Goal status: Partially met  3.  Pt will report <2/10 pain on NPS scale in order to show clinically significant improvement in pain at rest and with mobility. Baseline: increase shoulder discomfort with overhead lifting Goal status: Partially met  PLAN:  PT FREQUENCY: 1x/week  PT DURATION: 12 weeks  PLANNED INTERVENTIONS: 97164- PT Re-evaluation, 97110-Therapeutic exercises, 97530- Therapeutic activity, 97112- Neuromuscular re-education, 97140- Manual therapy, Joint mobilization, Cryotherapy, and Moist heat  PLAN FOR NEXT SESSION:  Progress UE strengthening/ functional reaching.  Work-related tasks  Lendell Quarry, PT, DPT # [phone] Physical Therapist - [place]  [facility] 7:12 PM,05/23/24

## 2024-05-30 ENCOUNTER — Ambulatory Visit: Admitting: Physical Therapy

## 2024-05-31 ENCOUNTER — Ambulatory Visit: Admitting: Physical Therapy

## 2024-05-31 ENCOUNTER — Encounter: Payer: Self-pay | Admitting: Physical Therapy

## 2024-05-31 DIAGNOSIS — G8929 Other chronic pain: Secondary | ICD-10-CM

## 2024-05-31 DIAGNOSIS — Z9889 Other specified postprocedural states: Secondary | ICD-10-CM | POA: Diagnosis not present

## 2024-05-31 DIAGNOSIS — M25612 Stiffness of left shoulder, not elsewhere classified: Secondary | ICD-10-CM

## 2024-05-31 NOTE — Therapy (Signed)
 OUTPATIENT PHYSICAL THERAPY SHOULDER TREATMENT  Patient Name: Hannah Cross MRN: [id number] DOB:1956-11-01, 68 y.o., female Today's Date: 06/01/2024  END OF SESSION:  PT End of Session - 05/31/24 1512     Visit Number 38    Number of Visits 42    Date for PT Re-Evaluation 06/28/24    PT Start Time 1513    PT Stop Time 1600    PT Time Calculation (min) 47 min         Past Surgical History:  Procedure Laterality Date   ABDOMINAL HYSTERECTOMY     OOPHORECTOMY     Patient Active Problem List   Diagnosis Date Noted   Syncope 08/06/2019    PCP: Claretta Croft, NP  REFERRING PROVIDER: Pam Bode, MD  REFERRING DIAG: Incomplete rotator cuff tear or rupture of left shoulder, not specified as traumatic; adhesive capsulitis of left shoulder  THERAPY DIAG:  S/P arthroscopy of left shoulder  Chronic left shoulder pain  Stiffness of left shoulder, not elsewhere classified  S/P left rotator cuff repair  Rationale for Evaluation and Treatment: Rehabilitation  ONSET DATE: 10/13/2023    SUBJECTIVE:                                                                                                                                                                                      SUBJECTIVE STATEMENT: Pt reports to PT s/p L shoulder arthroscopy with rotator cuff repair, extensive debridement, tenodesis of long tendon of biceps, decompression of subacromial space with partial acromioplasty and distal claviculectomy (10/13/2023). Pt reports dealing with chronic bilateral shoulder pain for most of this year and received injections in both shoulders (L side in July); pt reported improvement in pain with her R shoulder but no improvement with her left, which is what resulted in her having the surgery. She says her pain is usually worse at night and she is having trouble sleeping because of the pain. Pt reports all her sensation has returned since the numbing block  wore off earlier this week.  Hand dominance: Right  PERTINENT HISTORY: Adhesive capsulitis, stress fx of L fibula (September 2024)  PAIN:  Are you having pain? Yes: NPRS scale: 7/10 now, 9/10 worst, 2/10 best Pain location: L shoulder Pain description: Sharp, burning Aggravating factors: Movement, having shoulder at rest without support Relieving factors: Ibuprofen  PRECAUTIONS: Shoulder  RED FLAGS: None   WEIGHT BEARING RESTRICTIONS: Yes NWB LUE for first 6 weeks after surgery  FALLS:  Has patient fallen in last 6 months? No  LIVING ENVIRONMENT: Lives with: lives with their spouse Lives in: House/apartment Stairs: Yes: External: 3 steps; none Has following equipment at  home: None  OCCUPATION: Safety Specialist  PLOF: Independent  PATIENT GOALS:Get normal ROM back with no pain, get back to cooking for her family  NEXT MD VISIT: TBD  OBJECTIVE:  Note: Objective measures were completed at Evaluation unless otherwise noted.  DIAGNOSTIC FINDINGS:  N/A  PATIENT SURVEYS:  FOTO 41/63  COGNITION: Overall cognitive status: Within functional limits for tasks assessed     SENSATION: Not tested (pt reports sensation has returned since numbing block wore off)  POSTURE: No significant postural limitations noted  UPPER EXTREMITY ROM:   Passive/Active ROM Right Eval (Active)/In sitting Left eval  Shoulder flexion 166 90 (abiding by protocol)  Shoulder extension 75   Shoulder abduction 160   Shoulder adduction    Shoulder internal rotation [facility]   Shoulder external rotation WFL ~10 degrees (abiding by protocol)  Elbow flexion [facility] WFL  Elbow extension [facility] WFL  Wrist flexion    Wrist extension    Wrist ulnar deviation    Wrist radial deviation    Wrist pronation    Wrist supination    (Blank rows = not tested)  UPPER EXTREMITY MMT:  MMT Right eval Left eval  Shoulder flexion 5   Shoulder extension 5   Shoulder abduction 5   Shoulder adduction     Shoulder internal rotation 5   Shoulder external rotation 4   Middle trapezius    Lower trapezius    Elbow flexion    Elbow extension    Wrist flexion    Wrist extension    Wrist ulnar deviation    Wrist radial deviation    Wrist pronation    Wrist supination    Grip strength (lbs) 36.9 lbs average   (Blank rows = not tested)  12/17: Grip strength: R 51#, L 32#.     UPPER EXTREMITY ROM: Reassessment of B shoulder AROM   Active ROM Right  (Active)/In sitting Left   Shoulder flexion 168 deg. 145 deg.  Shoulder extension 65 deg. 45 deg.  Shoulder abduction 158 deg. 131 deg.   Shoulder adduction      Shoulder internal rotation 82 deg. 58 deg.   Shoulder external rotation 75 deg. 64 deg.  Elbow flexion [facility] WFL  Elbow extension [facility] WFL  Wrist flexion      Wrist extension      Wrist ulnar deviation      Wrist radial deviation      Wrist pronation      Wrist supination      (Blank rows = not tested)   UPPER EXTREMITY MMT: Reassessment of B shoulder MMT   MMT Right eval Left eval  Shoulder flexion 4-  4  Shoulder extension 4-  3  Shoulder abduction 4  3+  Shoulder adduction      Shoulder internal rotation 5  4  Shoulder external rotation 4-  3+  Middle trapezius  3  4  Lower trapezius      Elbow flexion      Elbow extension      Wrist flexion      Wrist extension      Wrist ulnar deviation      Wrist radial deviation      Wrist pronation      Wrist supination      Grip strength (lbs) 59.8#  44.6#  (Blank rows = not tested)  Seated L shoulder flexion (141 deg.), abduction (136).  Good scapular motion with shoulder abduction.  Supine L shoulder IR (60 deg.), ER (  46 deg.)- tight/ guarded today.  Increase L shoulder ER AROM to 62 deg.    Grip strength: R 61.8#, L 46.1#  TODAY'S TREATMENT:                                                                                                                                         DATE: 06/01/2024    Subjective:  Pt.  Reports 1-2/10 L sh. pain and soreness today.  Pt. Continues to be compliant with gym based ex.  Pt reports discomfort with straight arm movements and doorway pec strech.  MD f/u 7/16.     There.ex.:   B UBE 3 min. F/b.   Warm-up.  Discussed gym based ex. 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    Discussed pts. Job and pt. States she found out today that her position has been replaced.    Standing L shoulder at wall ladder 5x (no compensatory movement patterns).    Reassessment of shoulder AROM in standing position.  Improvement in IR noted today.    Nautilus (handles): seated lat. Pull down 60# 15x2/ standing tricep extension 30# 20x/ standing scap. Retraction 40# 15x2/ bicep 20# 20x.   Supine Grade 2-3 L shoulder mobs distraction, inferior, anterior   Supine L shoulder rhythmic stabs 5x30 sec. All planes.  Moderate resistance applied proximal to elbow.    STM to L UT/posterior and anterior deltoid musculature in seated posture after tx.    NOT TODAY:  Standing Bodyblade: flexion/ IR and ER/ behind back 2x each with mirror feedback.    Standing wt. Wand (added 2# ankle wt): sh. Flexion/ chest press 10x each.  Pt. Challenged and benefits from mirror feedback for technique.    PATIENT EDUCATION: Education details: Role of PT, PT plan of care, icing/ HEP with GTB.   Person educated: Patient Education method: Medical illustrator Education comprehension: verbalized understanding  HOME EXERCISE PROGRAM: Access Code: [email or web address] URL: [email or web address] Date: 10/26/2023 Prepared by: Hazeline Lister Exercises - Seated Scapular Retraction - 2 x daily - 7 x weekly - 1 sets - 10 reps - Seated Cervical Rotation AROM - 2 x daily - 7 x weekly - 1 sets - 10 reps - Seated Cervical Sidebending AROM - 2 x daily - 7 x weekly - 1 sets - 10 reps - Standing Circular Shoulder Pendulum Supported with Arm Bent - 2 x daily - 7 x weekly - 1 sets - 10 reps - Seated Elbow  Flexion AAROM - 2 x daily - 7 x weekly - 1 sets - 10 reps - Seated AAROM Elbow Supination/Pronation with Clasped Hands - 2 x daily - 7 x weekly - 1 sets - 10 reps  Access Code: [email or web address] URL: [email or web address] Date: 10/28/2023 Prepared by: Hazeline Lister  Exercises - Seated Scapular Retraction  - 2 x daily - 7 x weekly - 1 sets - 10 reps -  Seated Cervical Rotation AROM  - 2 x daily - 7 x weekly - 1 sets - 10 reps - Seated Cervical Sidebending AROM  - 2 x daily - 7 x weekly - 1 sets - 10 reps - Standing Circular Shoulder Pendulum Supported with Arm Bent  - 2 x daily - 7 x weekly - 1 sets - 10 reps - Seated Elbow Flexion AAROM  - 2 x daily - 7 x weekly - 1 sets - 10 reps - Seated Shoulder Flexion Self PROM  - 3 x daily - 7 x weekly - 1 sets - 10 reps - 5-10 second hold - Supine Shoulder External Rotation with Dowel  - 3 x daily - 7 x weekly - 1 sets - 10 reps - 5-10 seconds hold  Access Code: [email or web address] URL: [email or web address] Date: 11/16/2023 Prepared by: Hazeline Lister  Exercises - Seated Cervical Sidebending AROM  - 2 x daily - 7 x weekly - 1 sets - 10 reps - Circular Shoulder Pendulum with Table Support  - 2 x daily - 7 x weekly - 1 sets - 10 reps - Seated Shoulder Flexion AAROM with Pulley Behind  - 2 x daily - 7 x weekly - 1 sets - 20 reps - Seated Shoulder Scaption AAROM with Pulley at Side  - 2 x daily - 7 x weekly - 1 sets - 20 reps - Seated Shoulder Abduction AAROM with Pulley Behind  - 2 x daily - 7 x weekly - 1 sets - 20 reps - Supine Shoulder External Rotation in 45 Degrees Abduction AAROM with Dowel  - 2 x daily - 7 x weekly - 1 sets - 10 reps  Access Code: [email or web address] URL: [email or web address] Date: 12/12/2023 Prepared by: Hazeline Lister  Exercises - Seated Shoulder Flexion AAROM with Pulley Behind  - 2 x daily - 7 x weekly - 1 sets - 20 reps - Seated Shoulder Scaption AAROM with Pulley at Side  - 2 x daily - 7 x weekly - 1 sets  - 20 reps - Seated Shoulder Abduction AAROM with Pulley Behind  - 2 x daily - 7 x weekly - 1 sets - 20 reps - Standing Shoulder Flexion Wall Walk  - 2 x daily - 7 x weekly - 2 sets - 10 reps - Sidelying Shoulder External Rotation  - 2 x daily - 7 x weekly - 2 sets - 10 reps - Supine Pectoralis Stretch  - 1 x daily - 7 x weekly - 1 sets - 10 reps - Standing Isometric Shoulder External Rotation with Doorway  - 1 x daily - 7 x weekly - 2 sets - 10 reps - 5 seconds hold - Standing Isometric Shoulder Flexion with Doorway - Arm Bent  - 1 x daily - 7 x weekly - 2 sets - 10 reps - 5 seconds hold.  Access Code: [email or web address] URL: [email or web address] Date: 01/19/2024 Prepared by: Hazeline Lister  Exercises - Shoulder W - External Rotation with Resistance  - 1 x daily - 3-4 x weekly - 3 sets - 10 reps - Shoulder extension with resistance - Neutral  - 1 x daily - 3-4 x weekly - 3 sets - 10 reps - Doorway Pec Stretch at 60 Elevation  - 1 x daily - 3-4 x weekly - 3 sets - 10 reps - Squatting High Shoulder Row with Resistance  - 1 x daily - 3-4 x weekly - 3 sets - 10 reps - Standing Tricep Extensions with Resistance  - 1  x daily - 3-4 x weekly - 3 sets - 10 reps - Standing Upright Shoulder Row with Resistance  - 1 x daily - 3-4 x weekly - 3 sets - 10 reps - Standing Single Arm Bicep Curls Supinated with Dumbbell  - 1 x daily - 3-4 x weekly - 3 sets - 10 reps  ASSESSMENT:  CLINICAL IMPRESSION:    PT tx. session focused on L shoulder ROM and stability.  Pt. Will continue with more independent HEP and gym based/ aquatic ex.  Standing L shoulder flexion/ abduction to [facility] but pain limited and end-range.  Pt. Challenged with resisted Nautilus ex. And complete all reps with PT feedback to prevent compensatory movement patterns.   Good L shoulder control during rhythmic stabs with slight difficulty during flexion.   Pt will benefit from continued PT services 1x/week with combined gym based there.ex. to  increase L shoulder ROM/ stability to improve pain-free mobility.    OBJECTIVE IMPAIRMENTS: decreased ROM, decreased strength, hypomobility, impaired UE functional use, and pain.   ACTIVITY LIMITATIONS: carrying, bathing, dressing, reach over head, and hygiene/grooming  PARTICIPATION LIMITATIONS: meal prep, cleaning, laundry, driving, community activity, occupation, and yard work  PERSONAL FACTORS: Age and 1 comorbidity: adhesive capsulitis are also affecting patient's functional outcome.   REHAB POTENTIAL: Good  CLINICAL DECISION MAKING: Stable/uncomplicated  EVALUATION COMPLEXITY: Low  GOALS:  LONG TERM GOALS: Target date: 06/28/2024  Pt able to safely lift 50# box from waist to eye level with no sh. Pain or limitations to promote return to work.  Baseline: 30# box lift/ carry with good technique.   Goal status: Not met  2.  Pt will improve L shoulder MMT scores to at least 4/5 to demonstrate improvement in L shoulder strength. Baseline: See above Goal status: Partially met  3.  Pt will report <2/10 pain on NPS scale in order to show clinically significant improvement in pain at rest and with mobility. Baseline: increase shoulder discomfort with overhead lifting Goal status: Partially met  PLAN:  PT FREQUENCY: 1x/week  PT DURATION: 12 weeks  PLANNED INTERVENTIONS: 97164- PT Re-evaluation, 97110-Therapeutic exercises, 97530- Therapeutic activity, 97112- Neuromuscular re-education, 97140- Manual therapy, Joint mobilization, Cryotherapy, and Moist heat  PLAN FOR NEXT SESSION:  Progress UE strengthening/ functional reaching.  Work-related tasks  Lendell Quarry, PT, DPT # [phone] Physical Therapist - [place]  [facility] 9:59 AM,06/01/24

## 2024-06-06 ENCOUNTER — Ambulatory Visit: Admitting: Physical Therapy

## 2024-06-12 ENCOUNTER — Ambulatory Visit: Admitting: Physical Therapy

## 2024-06-14 ENCOUNTER — Ambulatory Visit: Attending: Hand Surgery | Admitting: Physical Therapy

## 2024-06-18 ENCOUNTER — Ambulatory Visit: Admitting: Physical Therapy
